# Patient Record
Sex: Female | Born: 1945 | Race: White | Hispanic: No | Marital: Married | Smoking: Former smoker
Health system: Southern US, Community
[De-identification: ages and names within clinical notes are randomized; demographics above are authoritative.]

## PROBLEM LIST (undated history)

## (undated) DIAGNOSIS — I10 Essential (primary) hypertension: Secondary | ICD-10-CM

## (undated) DIAGNOSIS — K589 Irritable bowel syndrome without diarrhea: Secondary | ICD-10-CM

## (undated) DIAGNOSIS — M810 Age-related osteoporosis without current pathological fracture: Secondary | ICD-10-CM

## (undated) DIAGNOSIS — H9190 Unspecified hearing loss, unspecified ear: Secondary | ICD-10-CM

## (undated) DIAGNOSIS — E079 Disorder of thyroid, unspecified: Secondary | ICD-10-CM

## (undated) DIAGNOSIS — C801 Malignant (primary) neoplasm, unspecified: Secondary | ICD-10-CM

## (undated) DIAGNOSIS — F419 Anxiety disorder, unspecified: Secondary | ICD-10-CM

## (undated) DIAGNOSIS — E039 Hypothyroidism, unspecified: Secondary | ICD-10-CM

## (undated) DIAGNOSIS — I341 Nonrheumatic mitral (valve) prolapse: Secondary | ICD-10-CM

## (undated) DIAGNOSIS — E785 Hyperlipidemia, unspecified: Secondary | ICD-10-CM

## (undated) DIAGNOSIS — M47812 Spondylosis without myelopathy or radiculopathy, cervical region: Secondary | ICD-10-CM

## (undated) DIAGNOSIS — G43909 Migraine, unspecified, not intractable, without status migrainosus: Secondary | ICD-10-CM

## (undated) HISTORY — DX: Migraine, unspecified, not intractable, without status migrainosus: G43.909

## (undated) HISTORY — DX: Age-related osteoporosis without current pathological fracture: M81.0

## (undated) HISTORY — PX: FRACTURE SURGERY: SHX138

## (undated) HISTORY — DX: Irritable bowel syndrome, unspecified: K58.9

## (undated) HISTORY — DX: Spondylosis without myelopathy or radiculopathy, cervical region: M47.812

## (undated) HISTORY — DX: Disorder of thyroid, unspecified: E07.9

## (undated) HISTORY — DX: Anxiety disorder, unspecified: F41.9

## (undated) HISTORY — DX: Nonrheumatic mitral (valve) prolapse: I34.1

## (undated) HISTORY — PX: BREAST CYST ASPIRATION: SHX578

## (undated) HISTORY — DX: Hyperlipidemia, unspecified: E78.5

## (undated) HISTORY — PX: ANKLE FRACTURE SURGERY: SHX122

## (undated) HISTORY — DX: Essential (primary) hypertension: I10

## (undated) HISTORY — DX: Malignant (primary) neoplasm, unspecified: C80.1

---

## 2004-11-20 ENCOUNTER — Ambulatory Visit: Payer: Self-pay | Admitting: Podiatry

## 2005-12-10 ENCOUNTER — Ambulatory Visit: Payer: Self-pay | Admitting: Unknown Physician Specialty

## 2010-01-27 ENCOUNTER — Ambulatory Visit: Payer: Self-pay | Admitting: Family Medicine

## 2011-02-04 ENCOUNTER — Ambulatory Visit: Payer: Self-pay | Admitting: Unknown Physician Specialty

## 2011-02-04 LAB — HM COLONOSCOPY

## 2011-05-07 ENCOUNTER — Ambulatory Visit: Payer: Self-pay

## 2011-05-10 LAB — HM MAMMOGRAPHY

## 2013-07-18 ENCOUNTER — Ambulatory Visit: Payer: Self-pay

## 2013-07-18 LAB — HM DEXA SCAN

## 2014-01-18 ENCOUNTER — Ambulatory Visit: Payer: Self-pay | Admitting: Neurology

## 2015-02-23 ENCOUNTER — Emergency Department: Payer: Self-pay | Admitting: Emergency Medicine

## 2015-05-20 DIAGNOSIS — A6920 Lyme disease, unspecified: Secondary | ICD-10-CM | POA: Diagnosis not present

## 2015-06-13 DIAGNOSIS — L570 Actinic keratosis: Secondary | ICD-10-CM | POA: Diagnosis not present

## 2015-06-13 DIAGNOSIS — X32XXXA Exposure to sunlight, initial encounter: Secondary | ICD-10-CM | POA: Diagnosis not present

## 2015-07-07 DIAGNOSIS — Z1231 Encounter for screening mammogram for malignant neoplasm of breast: Secondary | ICD-10-CM | POA: Diagnosis not present

## 2015-08-27 DIAGNOSIS — L720 Epidermal cyst: Secondary | ICD-10-CM | POA: Diagnosis not present

## 2015-08-27 DIAGNOSIS — D0439 Carcinoma in situ of skin of other parts of face: Secondary | ICD-10-CM | POA: Diagnosis not present

## 2015-08-27 DIAGNOSIS — D485 Neoplasm of uncertain behavior of skin: Secondary | ICD-10-CM | POA: Diagnosis not present

## 2015-09-15 DIAGNOSIS — E785 Hyperlipidemia, unspecified: Secondary | ICD-10-CM

## 2015-09-15 DIAGNOSIS — IMO0001 Reserved for inherently not codable concepts without codable children: Secondary | ICD-10-CM

## 2015-09-15 DIAGNOSIS — F419 Anxiety disorder, unspecified: Secondary | ICD-10-CM

## 2015-09-15 DIAGNOSIS — E063 Autoimmune thyroiditis: Secondary | ICD-10-CM | POA: Insufficient documentation

## 2015-09-15 DIAGNOSIS — M810 Age-related osteoporosis without current pathological fracture: Secondary | ICD-10-CM | POA: Insufficient documentation

## 2015-09-15 DIAGNOSIS — I341 Nonrheumatic mitral (valve) prolapse: Secondary | ICD-10-CM | POA: Insufficient documentation

## 2015-09-15 DIAGNOSIS — G43909 Migraine, unspecified, not intractable, without status migrainosus: Secondary | ICD-10-CM | POA: Insufficient documentation

## 2015-09-15 DIAGNOSIS — M47812 Spondylosis without myelopathy or radiculopathy, cervical region: Secondary | ICD-10-CM

## 2015-09-15 DIAGNOSIS — I10 Essential (primary) hypertension: Secondary | ICD-10-CM | POA: Insufficient documentation

## 2015-09-15 DIAGNOSIS — E039 Hypothyroidism, unspecified: Secondary | ICD-10-CM

## 2015-09-16 ENCOUNTER — Encounter: Payer: Self-pay | Admitting: Unknown Physician Specialty

## 2015-09-16 ENCOUNTER — Ambulatory Visit (INDEPENDENT_AMBULATORY_CARE_PROVIDER_SITE_OTHER): Payer: Medicare PPO | Admitting: Unknown Physician Specialty

## 2015-09-16 VITALS — BP 154/82 | HR 60 | Temp 98.4°F | Ht 66.0 in | Wt 149.2 lb

## 2015-09-16 DIAGNOSIS — E039 Hypothyroidism, unspecified: Secondary | ICD-10-CM

## 2015-09-16 DIAGNOSIS — G43909 Migraine, unspecified, not intractable, without status migrainosus: Secondary | ICD-10-CM

## 2015-09-16 DIAGNOSIS — F419 Anxiety disorder, unspecified: Secondary | ICD-10-CM

## 2015-09-16 DIAGNOSIS — I10 Essential (primary) hypertension: Secondary | ICD-10-CM

## 2015-09-16 DIAGNOSIS — Z23 Encounter for immunization: Secondary | ICD-10-CM | POA: Diagnosis not present

## 2015-09-16 DIAGNOSIS — E785 Hyperlipidemia, unspecified: Secondary | ICD-10-CM | POA: Diagnosis not present

## 2015-09-16 DIAGNOSIS — Z0000 Encounter for general adult medical examination without abnormal findings: Secondary | ICD-10-CM | POA: Diagnosis not present

## 2015-09-16 DIAGNOSIS — T753XXA Motion sickness, initial encounter: Secondary | ICD-10-CM

## 2015-09-16 LAB — MICROALBUMIN, URINE WAIVED
Creatinine, Urine Waived: 50 mg/dL (ref 10–300)
MICROALB, UR WAIVED: 10 mg/L (ref 0–19)
Microalb/Creat Ratio: <30 mg/g (ref <30)

## 2015-09-16 MED ORDER — HYDROCHLOROTHIAZIDE 25 MG PO TABS: 25.0000 mg | ORAL_TABLET | Freq: Every day | ORAL | Status: DC

## 2015-09-16 MED ORDER — METOPROLOL SUCCINATE ER 50 MG PO TB24: 45.0000 mg | ORAL_TABLET | Freq: Every day | ORAL | Status: DC

## 2015-09-16 MED ORDER — ALPRAZOLAM 0.25 MG PO TABS: 0.2500 mg | ORAL_TABLET | Freq: Every evening | ORAL | Status: DC | PRN

## 2015-09-16 MED ORDER — ATORVASTATIN CALCIUM 20 MG PO TABS: 20.0000 mg | ORAL_TABLET | Freq: Every day | ORAL | Status: DC

## 2015-09-16 MED ORDER — LEVOTHYROXINE SODIUM 75 MCG PO TABS: 75.0000 ug | ORAL_TABLET | Freq: Every day | ORAL | Status: DC

## 2015-09-16 MED ORDER — SCOPOLAMINE 1 MG/3DAYS TD PT72: 1.0000 | MEDICATED_PATCH | TRANSDERMAL | Status: DC

## 2015-09-16 NOTE — Assessment & Plan Note (Signed)
Second high reading as was up at the gyn.  I would like to add HCTZ

## 2015-09-16 NOTE — Assessment & Plan Note (Signed)
Refill cholesteorl meds.  Check lipid panel

## 2015-09-16 NOTE — Assessment & Plan Note (Signed)
Refill alprazolam 

## 2015-09-16 NOTE — Progress Notes (Signed)
BP 154/82 mmHg  Pulse 60  Temp(Src) 98.4 F (36.9 C)  Ht 5\' 6"  (1.676 m)  Wt 149 lb 3.2 oz (67.677 kg)  BMI 24.09 kg/m2  SpO2 98%  LMP  (LMP Unknown)   Subjective:    Patient ID: Savannah [name], female    DOB: [birth year], 69 y.o.   MRN: [id number]  HPI: Savannah [name] is a 69 y.o. female  Chief Complaint  Patient presents with  . Medicare Wellness  . Medication Refill    pt states she needs refills on the patch, and would like a 90 day supply of other medications. Would also like another rx for alprazolam (listed in historical medications in [facility])   Hyperlipidemia This is a chronic problem. The problem is controlled. She has no history of chronic renal disease, diabetes, hypothyroidism, liver disease, obesity or nephrotic syndrome. There are no known factors aggravating her hyperlipidemia. Pertinent negatives include no chest pain, focal sensory loss, focal weakness, leg pain, myalgias or shortness of breath. Current antihyperlipidemic treatment includes statins. The current treatment provides significant improvement of lipids. There are no compliance problems.    Hypothyroid No weight loss or weight gain.  No cold intolerance.    Anxiety Takes one very occasionally.  I prescription lasts 3 years.    Motion sickness Would like a patch for deep sea fishing.    Depression screen PHQ 2/9 09/16/2015  Decreased Interest 0  Down, Depressed, Hopeless 0  PHQ - 2 Score 0   Functional Status Survey: Is the patient deaf or have difficulty hearing?: Yes Does the patient have difficulty seeing, even when wearing glasses/contacts?: No Does the patient have difficulty concentrating, remembering, or making decisions?: No Does the patient have difficulty walking or climbing stairs?: No Does the patient have difficulty dressing or bathing?: No Does the patient have difficulty doing errands alone such as visiting a doctor's office or shopping?: No   Relevant past medical, surgical,  family and social history reviewed and updated as indicated. Interim medical history since our last visit reviewed. Allergies and medications reviewed and updated.  Review of Systems  Respiratory: Negative for shortness of breath.   Cardiovascular: Negative for chest pain.  Musculoskeletal: Negative for myalgias.  Neurological: Negative for focal weakness.    Per HPI unless specifically indicated above     Objective:    BP 154/82 mmHg  Pulse 60  Temp(Src) 98.4 F (36.9 C)  Ht 5\' 6"  (1.676 m)  Wt 149 lb 3.2 oz (67.677 kg)  BMI 24.09 kg/m2  SpO2 98%  LMP  (LMP Unknown)  Wt Readings from Last 3 Encounters:  09/16/15 149 lb 3.2 oz (67.677 kg)  05/20/15 147 lb (66.679 kg)    Physical Exam  Constitutional: She is oriented to person, place, and time. She appears well-developed and well-nourished. No distress.  HENT:  Head: Normocephalic and atraumatic.  Eyes: Conjunctivae and lids are normal. Right eye exhibits no discharge. Left eye exhibits no discharge. No scleral icterus.  Cardiovascular: Normal rate and regular rhythm.   Pulmonary/Chest: Effort normal. No respiratory distress.  Abdominal: Normal appearance and bowel sounds are normal. She exhibits no distension. There is no splenomegaly or hepatomegaly. There is no tenderness.  Musculoskeletal: Normal range of motion.  Neurological: She is alert and oriented to person, place, and time.  Skin: Skin is intact. No rash noted. No pallor.  Psychiatric: She has a normal mood and affect. Her behavior is normal. Judgment and thought content normal.  Assessment & Plan:   Problem List Items Addressed This Visit      Unprioritized   Hypothyroidism   Relevant Medications   levothyroxine (SYNTHROID, LEVOTHROID) 75 MCG tablet   metoprolol succinate (TOPROL-XL) 50 MG 24 hr tablet   Other Relevant Orders   TSH   Acute anxiety    Refill alprazolam      Relevant Medications   ALPRAZolam (XANAX) 0.25 MG tablet   Hyperlipidemia     Refill cholesteorl meds.  Check lipid panel      Relevant Medications   atorvastatin (LIPITOR) 20 MG tablet   metoprolol succinate (TOPROL-XL) 50 MG 24 hr tablet   hydrochlorothiazide (HYDRODIURIL) 25 MG tablet   Other Relevant Orders   Lipid Panel w/o Chol/HDL Ratio   Hypertension - Primary   Relevant Medications   atorvastatin (LIPITOR) 20 MG tablet   metoprolol succinate (TOPROL-XL) 50 MG 24 hr tablet   hydrochlorothiazide (HYDRODIURIL) 25 MG tablet   Other Relevant Orders   Comprehensive metabolic panel   Migraines    Second high reading as was up at the gyn.  I would like to add HCTZ      Relevant Medications   atorvastatin (LIPITOR) 20 MG tablet   metoprolol succinate (TOPROL-XL) 50 MG 24 hr tablet   hydrochlorothiazide (HYDRODIURIL) 25 MG tablet    Other Visit Diagnoses    Routine general medical examination at a health care facility        Relevant Orders    Hepatitis C antibody    Pneumococcal conjugate vaccine 13-valent IM    Motion sickness, initial encounter        Scopolamine refilled        Follow up plan: No Follow-up on file.

## 2015-09-17 ENCOUNTER — Encounter: Payer: Self-pay | Admitting: Unknown Physician Specialty

## 2015-09-17 LAB — COMPREHENSIVE METABOLIC PANEL
A/G RATIO: 2 (ref 1.1–2.5)
ALK PHOS: 60 IU/L (ref 39–117)
ALT: 10 IU/L (ref 0–32)
AST: 15 IU/L (ref 0–40)
Albumin: 4.3 g/dL (ref 3.5–4.8)
BILIRUBIN TOTAL: 0.6 mg/dL (ref 0.0–1.2)
BUN/Creatinine Ratio: 19 (ref 11–26)
BUN: 14 mg/dL (ref 8–27)
CHLORIDE: 99 mmol/L (ref 97–108)
CO2: 26 mmol/L (ref 18–29)
Calcium: 9.8 mg/dL (ref 8.7–10.3)
Creatinine, Ser: 0.75 mg/dL (ref 0.57–1.00)
GFR calc Af Amer: 93 mL/min/{1.73_m2} (ref >59)
GFR, EST NON AFRICAN AMERICAN: 81 mL/min/{1.73_m2} (ref >59)
GLOBULIN, TOTAL: 2.2 g/dL (ref 1.5–4.5)
Glucose: 85 mg/dL (ref 65–99)
POTASSIUM: 4.1 mmol/L (ref 3.5–5.2)
SODIUM: 144 mmol/L (ref 134–144)
Total Protein: 6.5 g/dL (ref 6.0–8.5)

## 2015-09-17 LAB — LIPID PANEL W/O CHOL/HDL RATIO
CHOLESTEROL TOTAL: 181 mg/dL (ref 100–199)
HDL: 89 mg/dL (ref >39)
LDL CALC: 73 mg/dL (ref 0–99)
TRIGLYCERIDES: 93 mg/dL (ref 0–149)
VLDL CHOLESTEROL CAL: 19 mg/dL (ref 5–40)

## 2015-09-17 LAB — HEPATITIS C ANTIBODY: Hep C Virus Ab: <0.1 s/co ratio (ref 0.0–0.9)

## 2015-09-17 LAB — TSH: TSH: 1.04 u[IU]/mL (ref 0.450–4.500)

## 2015-09-17 LAB — URIC ACID: Uric Acid: 5 mg/dL (ref 2.5–7.1)

## 2015-09-20 HISTORY — PX: OTHER SURGICAL HISTORY: SHX169

## 2015-10-09 DIAGNOSIS — D0439 Carcinoma in situ of skin of other parts of face: Secondary | ICD-10-CM | POA: Diagnosis not present

## 2015-10-09 DIAGNOSIS — L908 Other atrophic disorders of skin: Secondary | ICD-10-CM | POA: Diagnosis not present

## 2015-10-09 DIAGNOSIS — L814 Other melanin hyperpigmentation: Secondary | ICD-10-CM | POA: Diagnosis not present

## 2015-10-09 DIAGNOSIS — L578 Other skin changes due to chronic exposure to nonionizing radiation: Secondary | ICD-10-CM | POA: Diagnosis not present

## 2015-10-09 HISTORY — PX: SQUAMOUS CELL CARCINOMA EXCISION: SHX2433

## 2015-10-17 ENCOUNTER — Ambulatory Visit: Payer: Medicare PPO | Admitting: Unknown Physician Specialty

## 2015-11-24 ENCOUNTER — Encounter: Payer: Self-pay | Admitting: Unknown Physician Specialty

## 2015-11-24 ENCOUNTER — Ambulatory Visit (INDEPENDENT_AMBULATORY_CARE_PROVIDER_SITE_OTHER): Payer: Medicare PPO | Admitting: Unknown Physician Specialty

## 2015-11-24 VITALS — BP 124/74 | HR 81 | Temp 98.8°F | Ht 65.7 in | Wt 145.2 lb

## 2015-11-24 DIAGNOSIS — R030 Elevated blood-pressure reading, without diagnosis of hypertension: Secondary | ICD-10-CM

## 2015-11-24 DIAGNOSIS — IMO0001 Reserved for inherently not codable concepts without codable children: Secondary | ICD-10-CM

## 2015-11-24 NOTE — Progress Notes (Signed)
   BP 124/74 mmHg  Pulse 81  Temp(Src) 98.8 F (37.1 C)  Ht 5' 5.7" (1.669 m)  Wt 145 lb 3.2 oz (65.862 kg)  BMI 23.64 kg/m2  SpO2 97%  LMP  (LMP Unknown)   Subjective:    Patient ID: Savannah [name], female    DOB: [birth year], 69 y.o.   MRN: [id number]  HPI: Savannah [name] is a 69 y.o. female  Chief Complaint  Patient presents with  . Hypertension    pt states she is here for her blood pressure. States it is different between her two arms and sometimes her BP stays down. Wants to understand why her BP is different.   Hypertension Pt is taking HCTZ ut stopped taking it after checking her home BPs and were good.  At [facility] surgery it was in the 130's.  She restarted her HCTZ.   Average home BPs 120's/70's except on BP meds and SBP frequently below 120   No problems or lightheadedness No chest pain with exertion or shortness of breath No Edema  Hyperlipidemia Using medications without problems: No Muscle aches  Diet compliance: Exercise:  Relevant past medical, surgical, family and social history reviewed and updated as indicated. Interim medical history since our last visit reviewed. Allergies and medications reviewed and updated.  Review of Systems  Per HPI unless specifically indicated above     Objective:    BP 124/74 mmHg  Pulse 81  Temp(Src) 98.8 F (37.1 C)  Ht 5' 5.7" (1.669 m)  Wt 145 lb 3.2 oz (65.862 kg)  BMI 23.64 kg/m2  SpO2 97%  LMP  (LMP Unknown)  Wt Readings from Last 3 Encounters:  11/24/15 145 lb 3.2 oz (65.862 kg)  09/16/15 149 lb 3.2 oz (67.677 kg)  05/20/15 147 lb (66.679 kg)    Physical Exam  Constitutional: She is oriented to person, place, and time. She appears well-developed and well-nourished. No distress.  HENT:  Head: Normocephalic and atraumatic.  Eyes: Conjunctivae and lids are normal. Right eye exhibits no discharge. Left eye exhibits no discharge. No scleral icterus.  Neck: Normal range of motion. Neck supple. No JVD present. Carotid  bruit is not present.  Cardiovascular: Normal rate, regular rhythm and normal heart sounds.   Pulmonary/Chest: Effort normal and breath sounds normal.  Abdominal: Normal appearance. There is no splenomegaly or hepatomegaly.  Musculoskeletal: Normal range of motion.  Neurological: She is alert and oriented to person, place, and time.  Skin: Skin is warm, dry and intact. No rash noted. No pallor.  Psychiatric: She has a normal mood and affect. Her behavior is normal. Judgment and thought content normal.      Assessment & Plan:   Problem List Items Addressed This Visit      Unprioritized   White coat hypertension - Primary    BP is good at home and during Moh's surgery.  Will DC HCTZ          Follow up plan: Return in about 4 months (around 03/24/2016).

## 2015-11-24 NOTE — Assessment & Plan Note (Addendum)
BP is good at home and during Moh's surgery.  Will DC HCTZ

## 2016-02-01 ENCOUNTER — Encounter: Payer: Self-pay | Admitting: Emergency Medicine

## 2016-02-01 ENCOUNTER — Emergency Department: Payer: Medicare Other

## 2016-02-01 DIAGNOSIS — Z87891 Personal history of nicotine dependence: Secondary | ICD-10-CM | POA: Diagnosis not present

## 2016-02-01 DIAGNOSIS — F419 Anxiety disorder, unspecified: Secondary | ICD-10-CM | POA: Diagnosis not present

## 2016-02-01 DIAGNOSIS — Z7982 Long term (current) use of aspirin: Secondary | ICD-10-CM | POA: Diagnosis not present

## 2016-02-01 DIAGNOSIS — R002 Palpitations: Secondary | ICD-10-CM | POA: Diagnosis present

## 2016-02-01 DIAGNOSIS — Z79899 Other long term (current) drug therapy: Secondary | ICD-10-CM | POA: Insufficient documentation

## 2016-02-01 DIAGNOSIS — I10 Essential (primary) hypertension: Secondary | ICD-10-CM | POA: Insufficient documentation

## 2016-02-01 DIAGNOSIS — Z880 Allergy status to penicillin: Secondary | ICD-10-CM | POA: Insufficient documentation

## 2016-02-01 LAB — BASIC METABOLIC PANEL
Anion gap: 7 (ref 5–15)
BUN: 14 mg/dL (ref 6–20)
CALCIUM: 9.3 mg/dL (ref 8.9–10.3)
CO2: 30 mmol/L (ref 22–32)
CREATININE: 0.88 mg/dL (ref 0.44–1.00)
Chloride: 106 mmol/L (ref 101–111)
GFR calc Af Amer: >60 mL/min (ref >60)
GFR calc non Af Amer: >60 mL/min (ref >60)
Glucose, Bld: 154 mg/dL — ABNORMAL HIGH (ref 65–99)
Potassium: 3.7 mmol/L (ref 3.5–5.1)
Sodium: 143 mmol/L (ref 135–145)

## 2016-02-01 LAB — CBC
HCT: 41.7 % (ref 35.0–47.0)
Hemoglobin: 13.8 g/dL (ref 12.0–16.0)
MCH: 31.2 pg (ref 26.0–34.0)
MCHC: 33.1 g/dL (ref 32.0–36.0)
MCV: 94.1 fL (ref 80.0–100.0)
PLATELETS: 165 10*3/uL (ref 150–440)
RBC: 4.43 MIL/uL (ref 3.80–5.20)
RDW: 13.1 % (ref 11.5–14.5)
WBC: 5.2 10*3/uL (ref 3.6–11.0)

## 2016-02-01 LAB — TROPONIN I: TROPONIN I: 0.03 ng/mL (ref <0.031)

## 2016-02-01 NOTE — ED Notes (Signed)
Patient states that she feels like her heart is skipping a beat. She reports that it has been happening for about 10 days intermittently. Patient states that it does seem to improve with xanax.

## 2016-02-02 ENCOUNTER — Emergency Department
Admission: EM | Admit: 2016-02-02 | Discharge: 2016-02-02 | Disposition: A | Discharge status: Home / Self Care | Payer: Medicare Other | Attending: Emergency Medicine | Admitting: Emergency Medicine

## 2016-02-02 DIAGNOSIS — R002 Palpitations: Secondary | ICD-10-CM

## 2016-02-02 LAB — MAGNESIUM: Magnesium: 1.9 mg/dL (ref 1.7–2.4)

## 2016-02-02 LAB — TSH: TSH: 2.948 u[IU]/mL (ref 0.350–4.500)

## 2016-02-02 NOTE — ED Provider Notes (Signed)
[facility] Emergency Department Provider Note  ____________________________________________  Time seen: Approximately 301 AM  I have reviewed the triage vital signs and the nursing notes.   HISTORY  Chief Complaint Irregular Heart Beat    HPI Savannah [name] is a 70 y.o. female who comes into the hospital today with palpitations. The patient reports that her heart has been acting up and she is been dealing with it for the past 10 days. The patient reports that she is unsure if she is anxious or that something else. The symptoms don't stay all of the time. She reports that she feels a sensation at the bottom of her sternum that feels like a loop or a sinking feeling. She reports that then she feels her pulse there. The patient feels as though she has to catch her breath that she can't catch her breath and it seems to stay for hours. The sensation makes her anxious and scared. She did not have any episodes yesterday but did have multiple episodes today. The patient has been taking Xanax which she taken for in the past 10 days which is the thing that helps with her symptoms. She denies any pain to his discomfort. The patient is unsure brings it on but reports that when she eats sometimes it happens. The patient has not called her doctor to have this evaluated but has done Holter monitoring in the past. The patient reports that her blood pressure today was 161/85 so she became very concerned so she decided to come in to get checked out.The symptoms have resolved at this time.   Past Medical History  Diagnosis Date  . Thyroid disease   . MVP (mitral valve prolapse)   . Anxiety   . Osteoporosis   . OA (osteoarthritis) of neck   . Hyperlipidemia   . Hypertension   . IBS (irritable bowel syndrome)   . Cancer ([place])     skin- squamous    Patient Active Problem List   Diagnosis Date Noted  . Hypothyroidism 09/15/2015  . MVP (mitral valve prolapse) 09/15/2015  . Acute  anxiety 09/15/2015  . Osteoporosis 09/15/2015  . OA (osteoarthritis) of neck 09/15/2015  . Hyperlipidemia 09/15/2015  . White coat hypertension 09/15/2015  . Migraines 09/15/2015    Past Surgical History  Procedure Laterality Date  . 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 Ankle fracture surgery Left     Current Outpatient Rx  Name  Route  Sig  Dispense  Refill  . ALPRAZolam (XANAX) 0.25 MG tablet   Oral   Take 1 tablet (0.25 mg total) by mouth at bedtime as needed for anxiety.   20 tablet   0   . aspirin 81 MG tablet   Oral   Take 81 mg by mouth daily.         Marland Kitchen atorvastatin (LIPITOR) 20 MG tablet   Oral   Take 1 tablet (20 mg total) by mouth daily.   90 tablet   1   . hydrochlorothiazide (HYDRODIURIL) 25 MG tablet   Oral   Take 1 tablet (25 mg total) by mouth daily.   90 tablet   3   . levothyroxine (SYNTHROID, LEVOTHROID) 75 MCG tablet   Oral   Take 1 tablet (75 mcg total) by mouth daily before breakfast.   90 tablet   1   . metoprolol succinate (TOPROL-XL) 50 MG 24 hr tablet   Oral   Take 1 tablet (50  mg total) by mouth daily. Take 1/2 tablet daily   45 tablet   1   . scopolamine (TRANSDERM-SCOP) 1 MG/3DAYS   Transdermal   Place 1 patch (1.5 mg total) onto the skin every 3 (three) days.   10 patch   3     Allergies Amoxil and Penicillins  Family History  Problem Relation Age of Onset  . Cancer Mother     colon  . Hypertension Mother   . Cancer Father     lung  . Hyperlipidemia Brother     Social History Social History  Substance Use Topics  . Smoking status: Former Research scientist (life sciences)  . Smokeless tobacco: Never Used  . Alcohol Use: 8.4 oz/week    14 Glasses of wine, 0 Standard drinks or equivalent per week    Review of Systems Constitutional: No fever/chills Eyes: No visual changes. ENT: No sore throat. Cardiovascular: Palpitations Respiratory: shortness of breath. Gastrointestinal: No abdominal pain.  No nausea, no  vomiting.  No diarrhea.  No constipation. Genitourinary: Negative for dysuria. Musculoskeletal: Negative for back pain. Skin: Negative for rash. Neurological: Negative for headaches, focal weakness or numbness.  10-point ROS otherwise negative.  ____________________________________________   PHYSICAL EXAM:  VITAL SIGNS: ED Triage Vitals  Enc Vitals Group     BP 02/01/16 2222 161/85 mmHg     Pulse Rate 02/01/16 2222 79     Resp 02/01/16 2222 18     Temp 02/01/16 2222 98.4 F (36.9 C)     Temp Source 02/01/16 2222 Oral     SpO2 02/01/16 2222 98 %     Weight 02/01/16 2222 145 lb (65.772 kg)     Height 02/01/16 2222 5\' 6"  (1.676 m)     Head Cir --      Peak Flow --      Pain Score --      Pain Loc --      Pain Edu? --      Excl. in [place]? --     Constitutional: Alert and oriented. Well appearing and in no acute distress. Eyes: Conjunctivae are normal. PERRL. EOMI. Head: Atraumatic. Nose: No congestion/rhinnorhea. Mouth/Throat: Mucous membranes are moist.  Oropharynx non-erythematous. Cardiovascular: Normal rate, regular rhythm. Grossly normal heart sounds.  Good peripheral circulation. Respiratory: Normal respiratory effort.  No retractions. Lungs CTAB. Gastrointestinal: Soft and nontender. No distention. Positive bowel sounds Musculoskeletal: No lower extremity tenderness nor edema.  Neurologic:  Normal speech and language.  Skin:  Skin is warm, dry and intact.  Psychiatric: Mood and affect are normal.   ____________________________________________   LABS (all labs ordered are listed, but only abnormal results are displayed)  Labs Reviewed  BASIC METABOLIC PANEL - Abnormal; Notable for the following:    Glucose, Bld 154 (*)    All other components within normal limits  CBC  TROPONIN I  MAGNESIUM  TSH   ____________________________________________  EKG  ED ECG REPORT I, Loney Hering, the attending physician, personally viewed and interpreted this  ECG.   Date: 02/01/2016  EKG Time: 2229  Rate: 75  Rhythm: normal sinus rhythm  Axis: normal  Intervals:none  ST&T Change: none  ____________________________________________  RADIOLOGY  Chest x-ray: No active cardiopulmonary disease ____________________________________________   PROCEDURES  Procedure(s) performed: None  Critical Care performed: No  ____________________________________________   INITIAL IMPRESSION / ASSESSMENT AND PLAN / ED COURSE  Pertinent labs & imaging results that were available during my care of the patient were reviewed by me and considered in  my medical decision making (see chart for details).  This is a 70 year old female who comes into the hospital today with 10 days of palpitations. The patient's symptoms are resolved but she is very anxious about what may be causing her symptoms. We did have a conversation about her caffeine intake which she reports that she just 1-1-1/2 cups a day. The patient otherwise is unsure what going on. The patient's blood work is unremarkable and she does not have any of these symptoms at this time. I did inform the patient about cutting back on her caffeine and following up with her doctor to have Holter monitoring to determine the symptoms of her palpitations. The patient will be discharged home to follow-up with her doctor. The patient has no further questions at this time and understands the plan as stated. ____________________________________________   FINAL CLINICAL IMPRESSION(S) / ED DIAGNOSES  Final diagnoses:  Palpitations      Loney Hering, MD 02/02/16 [phone]

## 2016-02-02 NOTE — Discharge Instructions (Signed)

## 2016-02-02 NOTE — ED Notes (Signed)
Pt states for 10 days has had epigastric "discomfort" associated with palpitaitons and "feeling like i can't catch my breath". Pt denies cough, diaphoresis, dizziness, syncope, near syncope, jaw pain, neck pain, arm pain. Pt states does have pain radiation to right mid back. Pt states discomfort and irregular heart rate sensation is worse after eating. Pt describes discomfort as "a sinking feeling". Skin pwd. resps unlabored. Pt denies nausea, vomiting.

## 2016-02-02 NOTE — ED Notes (Signed)
Pt updated on add on labs of magnesium and tsh. Pt verbalizes  Understanding.

## 2016-02-12 ENCOUNTER — Other Ambulatory Visit: Payer: Self-pay | Admitting: Nurse Practitioner

## 2016-02-12 DIAGNOSIS — R1013 Epigastric pain: Secondary | ICD-10-CM

## 2016-02-25 ENCOUNTER — Ambulatory Visit: Payer: Medicare Other

## 2016-03-01 ENCOUNTER — Ambulatory Visit: Payer: Medicare Other

## 2016-03-03 ENCOUNTER — Ambulatory Visit: Payer: Medicare PPO | Admitting: Unknown Physician Specialty

## 2016-03-03 ENCOUNTER — Ambulatory Visit
Admission: RE | Admit: 2016-03-03 | Discharge: 2016-03-03 | Disposition: A | Discharge status: Home / Self Care | Payer: Medicare Other | Source: Ambulatory Visit | Attending: Nurse Practitioner | Admitting: Nurse Practitioner

## 2016-03-03 DIAGNOSIS — D1771 Benign lipomatous neoplasm of kidney: Secondary | ICD-10-CM | POA: Insufficient documentation

## 2016-03-03 DIAGNOSIS — R1013 Epigastric pain: Secondary | ICD-10-CM | POA: Insufficient documentation

## 2016-04-07 ENCOUNTER — Other Ambulatory Visit: Payer: Self-pay | Admitting: Unknown Physician Specialty

## 2016-04-07 NOTE — Telephone Encounter (Signed)
Needs check

## 2016-04-07 NOTE — Telephone Encounter (Signed)
Patient has appointment scheduled for 04/13/16.

## 2016-04-09 ENCOUNTER — Encounter: Payer: Self-pay | Admitting: *Deleted

## 2016-04-12 ENCOUNTER — Ambulatory Visit
Admission: RE | Admit: 2016-04-12 | Discharge: 2016-04-12 | Disposition: A | Discharge status: Home / Self Care | Payer: Medicare Other | Source: Ambulatory Visit | Attending: Unknown Physician Specialty | Admitting: Unknown Physician Specialty

## 2016-04-12 ENCOUNTER — Encounter
Admission: RE | Disposition: A | Discharge status: Home / Self Care | Payer: Self-pay | Source: Ambulatory Visit | Attending: Unknown Physician Specialty

## 2016-04-12 ENCOUNTER — Ambulatory Visit: Payer: Medicare Other | Admitting: Anesthesiology

## 2016-04-12 ENCOUNTER — Encounter: Payer: Self-pay | Admitting: *Deleted

## 2016-04-12 DIAGNOSIS — Z1211 Encounter for screening for malignant neoplasm of colon: Secondary | ICD-10-CM | POA: Diagnosis present

## 2016-04-12 DIAGNOSIS — E079 Disorder of thyroid, unspecified: Secondary | ICD-10-CM | POA: Insufficient documentation

## 2016-04-12 DIAGNOSIS — Z79899 Other long term (current) drug therapy: Secondary | ICD-10-CM | POA: Diagnosis not present

## 2016-04-12 DIAGNOSIS — I10 Essential (primary) hypertension: Secondary | ICD-10-CM | POA: Insufficient documentation

## 2016-04-12 DIAGNOSIS — Z7982 Long term (current) use of aspirin: Secondary | ICD-10-CM | POA: Diagnosis not present

## 2016-04-12 DIAGNOSIS — M479 Spondylosis, unspecified: Secondary | ICD-10-CM | POA: Insufficient documentation

## 2016-04-12 DIAGNOSIS — Z8249 Family history of ischemic heart disease and other diseases of the circulatory system: Secondary | ICD-10-CM | POA: Diagnosis not present

## 2016-04-12 DIAGNOSIS — Z85828 Personal history of other malignant neoplasm of skin: Secondary | ICD-10-CM | POA: Diagnosis not present

## 2016-04-12 DIAGNOSIS — Z8349 Family history of other endocrine, nutritional and metabolic diseases: Secondary | ICD-10-CM | POA: Diagnosis not present

## 2016-04-12 DIAGNOSIS — K621 Rectal polyp: Secondary | ICD-10-CM | POA: Diagnosis not present

## 2016-04-12 DIAGNOSIS — M810 Age-related osteoporosis without current pathological fracture: Secondary | ICD-10-CM | POA: Diagnosis not present

## 2016-04-12 DIAGNOSIS — Z881 Allergy status to other antibiotic agents status: Secondary | ICD-10-CM | POA: Insufficient documentation

## 2016-04-12 DIAGNOSIS — E785 Hyperlipidemia, unspecified: Secondary | ICD-10-CM | POA: Diagnosis not present

## 2016-04-12 DIAGNOSIS — Z801 Family history of malignant neoplasm of trachea, bronchus and lung: Secondary | ICD-10-CM | POA: Insufficient documentation

## 2016-04-12 DIAGNOSIS — Z880 Allergy status to penicillin: Secondary | ICD-10-CM | POA: Diagnosis not present

## 2016-04-12 DIAGNOSIS — K640 First degree hemorrhoids: Secondary | ICD-10-CM | POA: Diagnosis not present

## 2016-04-12 DIAGNOSIS — F419 Anxiety disorder, unspecified: Secondary | ICD-10-CM | POA: Insufficient documentation

## 2016-04-12 DIAGNOSIS — K589 Irritable bowel syndrome without diarrhea: Secondary | ICD-10-CM | POA: Insufficient documentation

## 2016-04-12 DIAGNOSIS — Z800 Family history of malignant neoplasm of digestive organs: Secondary | ICD-10-CM | POA: Diagnosis present

## 2016-04-12 DIAGNOSIS — Z87891 Personal history of nicotine dependence: Secondary | ICD-10-CM | POA: Diagnosis not present

## 2016-04-12 DIAGNOSIS — I341 Nonrheumatic mitral (valve) prolapse: Secondary | ICD-10-CM | POA: Insufficient documentation

## 2016-04-12 HISTORY — PX: COLONOSCOPY WITH PROPOFOL: SHX5780

## 2016-04-12 SURGERY — COLONOSCOPY WITH PROPOFOL
Anesthesia: General

## 2016-04-12 MED ORDER — FENTANYL CITRATE (PF) 100 MCG/2ML IJ SOLN
INTRAMUSCULAR | Status: DC | PRN
  Administered 2016-04-12: 50 ug via INTRAVENOUS

## 2016-04-12 MED ORDER — PROPOFOL 500 MG/50ML IV EMUL
INTRAVENOUS | Status: DC | PRN
  Administered 2016-04-12: 120 ug/kg/min via INTRAVENOUS

## 2016-04-12 MED ORDER — MIDAZOLAM HCL 2 MG/2ML IJ SOLN
INTRAMUSCULAR | Status: DC | PRN
  Administered 2016-04-12: 1 mg via INTRAVENOUS

## 2016-04-12 MED ORDER — SODIUM CHLORIDE 0.9 % IV SOLN
INTRAVENOUS | Status: DC
  Administered 2016-04-12 (×2): via INTRAVENOUS
  Administered 2016-04-12: 1000 mL via INTRAVENOUS

## 2016-04-12 MED ORDER — SODIUM CHLORIDE 0.9 % IV SOLN: INTRAVENOUS | Status: DC

## 2016-04-12 NOTE — Transfer of Care (Signed)
Immediate Anesthesia Transfer of Care Note  Patient: Savannah [name]  Procedure(s) Performed: Procedure(s): COLONOSCOPY WITH PROPOFOL (N/A)  Patient Location: PACU  Anesthesia Type:General  Level of Consciousness: awake, alert  and oriented  Airway & Oxygen Therapy: Patient Spontanous Breathing and Patient connected to nasal cannula oxygen  Post-op Assessment: Report given to RN and Post -op Vital signs reviewed and stable  Post vital signs: Reviewed and stable  Last Vitals:  Filed Vitals:   04/12/16 1224  BP: 155/75  Pulse: 76  Temp: 36.6 C  Resp: 18    Complications: No apparent anesthesia complications

## 2016-04-12 NOTE — Anesthesia Preprocedure Evaluation (Signed)
Anesthesia Evaluation  Patient identified by MRN, date of birth, ID band Patient awake    Reviewed: Allergy & Precautions, NPO status , Patient's Chart, lab work & pertinent test results, reviewed documented beta blocker date and time   Airway Mallampati: II  TM Distance: >3 FB     Dental  (+) Chipped   Pulmonary former smoker,           Cardiovascular hypertension, Pt. on medications and Pt. on home beta blockers      Neuro/Psych  Headaches, Anxiety    GI/Hepatic   Endo/Other  Hypothyroidism   Renal/GU      Musculoskeletal  (+) Arthritis ,   Abdominal   Peds  Hematology   Anesthesia Other Findings   Reproductive/Obstetrics                             Anesthesia Physical Anesthesia Plan  ASA: III  Anesthesia Plan: General   Post-op Pain Management:    Induction:   Airway Management Planned: Nasal Cannula  Additional Equipment:   Intra-op Plan:   Post-operative Plan:   Informed Consent: I have reviewed the patients History and Physical, chart, labs and discussed the procedure including the risks, benefits and alternatives for the proposed anesthesia with the patient or authorized representative who has indicated his/her understanding and acceptance.     Plan Discussed with: CRNA  Anesthesia Plan Comments:         Anesthesia Quick Evaluation

## 2016-04-12 NOTE — H&P (Signed)
Primary Care Physician:  Kathrine Haddock, NP Primary Gastroenterologist:  Dr. Vira Agar  Pre-Procedure History & Physical: HPI:  Savannah [name] is a 70 y.o. female is here for an colonoscopy.   Past Medical History  Diagnosis Date  . Thyroid disease   . MVP (mitral valve prolapse)   . Anxiety   . Osteoporosis   . OA (osteoarthritis) of neck   . Hyperlipidemia   . Hypertension   . IBS (irritable bowel syndrome)   . 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 Ankle fracture surgery Left   . Fracture surgery      Prior to Admission medications   Medication Sig Start Date End Date Taking? Authorizing Provider  ALPRAZolam (XANAX) 0.25 MG tablet Take 1 tablet (0.25 mg total) by mouth at bedtime as needed for anxiety. 09/16/15  Yes Kathrine Haddock, NP  aspirin 81 MG tablet Take 81 mg by mouth daily.   Yes Historical Provider, MD  atorvastatin (LIPITOR) 20 MG tablet Take 1 tablet (20 mg total) by mouth daily. 04/07/16  Yes Kathrine Haddock, NP  metoprolol succinate (TOPROL-XL) 50 MG 24 hr tablet Take 1 tablet (50 mg total) by mouth daily. Take 1/2 tablet daily 09/16/15  Yes Kathrine Haddock, NP  scopolamine (TRANSDERM-SCOP) 1 MG/3DAYS Place 1 patch (1.5 mg total) onto the skin every 3 (three) days. 09/16/15  Yes Kathrine Haddock, NP  SYNTHROID 75 MCG tablet Take 1 tablet (75 mcg total) by mouth daily before breakfast. 04/07/16  Yes Kathrine Haddock, NP  hydrochlorothiazide (HYDRODIURIL) 25 MG tablet Take 1 tablet (25 mg total) by mouth daily. Patient not taking: Reported on 04/12/2016 09/16/15   Kathrine Haddock, NP    Allergies as of 04/06/2016 - Review Complete 02/01/2016  Allergen Reaction Noted  . Amoxil [amoxicillin]  09/15/2015  . Penicillins  02/01/2016    Family History  Problem Relation Age of Onset  . Cancer Mother     colon  . Hypertension Mother   . Cancer Father     lung  . Hyperlipidemia  Brother     Social History   Social History  . Marital Status: Married    Spouse Name: N/A  . Number of Children: N/A  . Years of Education: N/A   Occupational History  . Not on file.   Social History Main Topics  . Smoking status: Former Research scientist (life sciences)  . Smokeless tobacco: Never Used  . Alcohol Use: 8.4 oz/week    14 Glasses of wine, 0 Standard drinks or equivalent per week  . Drug Use: No  . Sexual Activity: Yes   Other Topics Concern  . Not on file   Social History Narrative    Review of Systems: See HPI, otherwise negative ROS  Physical Exam: BP 155/75 mmHg  Pulse 76  Temp(Src) 97.8 F (36.6 C) (Tympanic)  Resp 18  Ht 5\' 6"  (1.676 m)  Wt 65.772 kg (145 lb)  BMI 23.41 kg/m2  SpO2 100%  LMP  (LMP Unknown) General:   Alert,  pleasant and cooperative in NAD Head:  Normocephalic and atraumatic. Neck:  Supple; no masses or thyromegaly. Lungs:  Clear throughout to auscultation.    Heart:  Regular rate and rhythm. Abdomen:  Soft, nontender and nondistended. Normal bowel sounds, without guarding, and without rebound.   Neurologic:  Alert and  oriented x4;  grossly normal neurologically.  Impression/Plan: [name] is here for an colonoscopy to be performed for FH colon cancer  Risks, benefits, limitations, and alternatives regarding  colonoscopy have been reviewed with the patient.  Questions have been answered.  All parties agreeable.   Gaylyn Cheers, MD  04/12/2016, 1:43 PM

## 2016-04-12 NOTE — Op Note (Signed)
[facility] Patient Name: Savannah [name] Procedure Date: 04/12/2016 1:38 PM MRN: [id number] Account #: [id number] Date of Birth: [birth year] Admit Type: Outpatient Age: 70 Room: [facility] ENDO ROOM 1 Gender: Female Note Status: Finalized Procedure:            Colonoscopy Indications:          Screening in patient at increased risk: Family history                        of 1st-degree relative with colorectal cancer Providers:            Manya Silvas, MD Referring MD:         Kathrine Haddock, PA (Referring MD) Medicines:            Propofol per Anesthesia Complications:        No immediate complications. Procedure:            Pre-Anesthesia Assessment:                       - After reviewing the risks and benefits, the patient                        was deemed in satisfactory condition to undergo the                        procedure.                       After obtaining informed consent, the colonoscope was                        passed under direct vision. Throughout the procedure,                        the patient's blood pressure, pulse, and oxygen                        saturations were monitored continuously. The                        Colonoscope was introduced through the anus and                        advanced to the the cecum, identified by appendiceal                        orifice and ileocecal valve. The colonoscopy was                        performed without difficulty. The patient tolerated the                        procedure well. The quality of the bowel preparation                        was good. Findings:      Two sessile polyps were found in the rectum. The polyps were very       diminutive in size. These polyps were removed with a jumbo cold forceps.       Resection and retrieval were complete.  Internal hemorrhoids were found during endoscopy. The hemorrhoids were       small and Grade I (internal hemorrhoids that do not  prolapse).      The exam was otherwise without abnormality. Impression:           - Two diminutive polyps in the rectum, removed with a                        jumbo cold forceps. Resected and retrieved.                       - Internal hemorrhoids.                       - The examination was otherwise normal. Recommendation:       - Await pathology results. Manya Silvas, MD 04/12/2016 2:15:00 PM This report has been signed electronically. Number of Addenda: 0 Note Initiated On: 04/12/2016 1:38 PM Scope Withdrawal Time: 0 hours 19 minutes 29 seconds  Total Procedure Duration: 0 hours 24 minutes 1 second       [facility]

## 2016-04-12 NOTE — Anesthesia Procedure Notes (Signed)
Performed by: Vaughan Sine Pre-anesthesia Checklist: Patient identified, Emergency Drugs available, Suction available, Timeout performed and Patient being monitored Oxygen Delivery Method: Nasal cannula Preoxygenation: Pre-oxygenation with 100% oxygen Intubation Type: IV induction Placement Confirmation: positive ETCO2 and CO2 detector

## 2016-04-12 NOTE — Anesthesia Postprocedure Evaluation (Signed)
Anesthesia Post Note  Patient: Savannah [name]  Procedure(s) Performed: Procedure(s) (LRB): COLONOSCOPY WITH PROPOFOL (N/A)  Patient location during evaluation: Endoscopy Anesthesia Type: General Level of consciousness: awake and alert Pain management: pain level controlled Vital Signs Assessment: post-procedure vital signs reviewed and stable Respiratory status: spontaneous breathing, nonlabored ventilation, respiratory function stable and patient connected to nasal cannula oxygen Cardiovascular status: blood pressure returned to baseline and stable Postop Assessment: no signs of nausea or vomiting Anesthetic complications: no    Last Vitals:  Filed Vitals:   04/12/16 1437 04/12/16 1447  BP: 111/62 118/68  Pulse: 76 79  Temp:    Resp: 18 16    Last Pain: There were no vitals filed for this visit.               Corrina Steffensen S

## 2016-04-13 ENCOUNTER — Ambulatory Visit (INDEPENDENT_AMBULATORY_CARE_PROVIDER_SITE_OTHER): Payer: Medicare Other | Admitting: Unknown Physician Specialty

## 2016-04-13 ENCOUNTER — Encounter: Payer: Self-pay | Admitting: Unknown Physician Specialty

## 2016-04-13 VITALS — BP 126/76 | HR 69 | Temp 98.4°F | Ht 65.8 in | Wt 150.4 lb

## 2016-04-13 DIAGNOSIS — Z800 Family history of malignant neoplasm of digestive organs: Secondary | ICD-10-CM | POA: Diagnosis not present

## 2016-04-13 DIAGNOSIS — IMO0001 Reserved for inherently not codable concepts without codable children: Secondary | ICD-10-CM

## 2016-04-13 DIAGNOSIS — E785 Hyperlipidemia, unspecified: Secondary | ICD-10-CM | POA: Diagnosis not present

## 2016-04-13 DIAGNOSIS — F419 Anxiety disorder, unspecified: Secondary | ICD-10-CM | POA: Diagnosis not present

## 2016-04-13 DIAGNOSIS — R030 Elevated blood-pressure reading, without diagnosis of hypertension: Secondary | ICD-10-CM

## 2016-04-13 MED ORDER — ATORVASTATIN CALCIUM 20 MG PO TABS: 20.0000 mg | ORAL_TABLET | Freq: Every day | ORAL | Status: DC

## 2016-04-13 MED ORDER — ALPRAZOLAM 0.25 MG PO TABS: 0.2500 mg | ORAL_TABLET | Freq: Every evening | ORAL | Status: DC | PRN

## 2016-04-13 MED ORDER — LEVOTHYROXINE SODIUM 75 MCG PO TABS: 75.0000 ug | ORAL_TABLET | Freq: Every day | ORAL | Status: DC

## 2016-04-13 MED ORDER — METOPROLOL SUCCINATE ER 50 MG PO TB24: 45.0000 mg | ORAL_TABLET | Freq: Every day | ORAL | Status: DC

## 2016-04-13 NOTE — Assessment & Plan Note (Signed)
Takes Xanax on occasion

## 2016-04-13 NOTE — Assessment & Plan Note (Signed)
Good numbers today

## 2016-04-13 NOTE — Assessment & Plan Note (Signed)
Check LDL. 

## 2016-04-13 NOTE — Progress Notes (Signed)
BP 126/76 mmHg  Pulse 69  Temp(Src) 98.4 F (36.9 C)  Ht 5' 5.8" (1.671 m)  Wt 150 lb 6.4 oz (68.221 kg)  BMI 24.43 kg/m2  SpO2 97%  LMP  (LMP Unknown)   Subjective:    Patient ID: Savannah [name], female    DOB: 08-Dec-1946, 70 y.o.   MRN: [id number]  HPI: Savannah [name] is a 70 y.o. female  Chief Complaint  Patient presents with  . Medicare Wellness    pt states she knows she is due for a bone density  . Medication Refill    pt would like all medications refilled for a year   Functional Status Survey: Is the patient deaf or have difficulty hearing?: Yes (pt has hearing aid for both ears) Does the patient have difficulty seeing, even when wearing glasses/contacts?: No Does the patient have difficulty concentrating, remembering, or making decisions?: No Does the patient have difficulty walking or climbing stairs?: No Does the patient have difficulty dressing or bathing?: No Does the patient have difficulty doing errands alone such as visiting a doctor's office or shopping?: No  Depression screen [facility] 2/9 04/13/2016 09/16/2015  Decreased Interest 0 0  Down, Depressed, Hopeless 0 0  PHQ - 2 Score 0 0   Hypertension Using medications without difficulty Average home BPs Not checking  No problems or lightheadedness No chest pain with exertion or shortness of breath No Edema  Hyperlipidemia Using medications without problems: No Muscle aches  Diet compliance:good Exercise: Always active and working in the yard all the time.  Planning on walking 4 miles   Anxiety  Takes occasional Xanax.  Had an ER visit due to irregular heart rate and noted to have PVCs and evaluated by Dr. Nehemiah Massed.  Was told it was related to stress.     Relevant past medical, surgical, family and social history reviewed and updated as indicated. Interim medical history since our last visit reviewed. Allergies and medications reviewed and updated.  Review of Systems  Constitutional: Negative.   HENT:  Negative.   Eyes: Negative.   Respiratory: Negative.   Cardiovascular: Negative.   Gastrointestinal: Negative.   Endocrine: Negative.   Genitourinary: Negative.   Musculoskeletal: Negative.   Skin: Negative.   Allergic/Immunologic: Negative.   Neurological: Negative.   Hematological: Negative.   Psychiatric/Behavioral: Negative.     Per HPI unless specifically indicated above     Objective:    BP 126/76 mmHg  Pulse 69  Temp(Src) 98.4 F (36.9 C)  Ht 5' 5.8" (1.671 m)  Wt 150 lb 6.4 oz (68.221 kg)  BMI 24.43 kg/m2  SpO2 97%  LMP  (LMP Unknown)  Wt Readings from Last 3 Encounters:  04/13/16 150 lb 6.4 oz (68.221 kg)  04/12/16 145 lb (65.772 kg)  02/01/16 145 lb (65.772 kg)    Physical Exam  Constitutional: She is oriented to person, place, and time. She appears well-developed and well-nourished. No distress.  HENT:  Head: Normocephalic and atraumatic.  Eyes: Conjunctivae and lids are normal. Right eye exhibits no discharge. Left eye exhibits no discharge. No scleral icterus.  Cardiovascular: Normal rate.   Pulmonary/Chest: Effort normal.  Abdominal: Normal appearance. There is no splenomegaly or hepatomegaly.  Musculoskeletal: Normal range of motion.  Neurological: She is alert and oriented to person, place, and time.  Skin: Skin is intact. No rash noted. No pallor.  Psychiatric: She has a normal mood and affect. Her behavior is normal. Judgment and thought content normal.  Results for orders placed or performed during the hospital encounter of 0000000  Basic metabolic panel  Result Value Ref Range   Sodium 143 135 - 145 mmol/L   Potassium 3.7 3.5 - 5.1 mmol/L   Chloride 106 101 - 111 mmol/L   CO2 30 22 - 32 mmol/L   Glucose, Bld 154 (H) 65 - 99 mg/dL   BUN 14 6 - 20 mg/dL   Creatinine, Ser 0.88 0.44 - 1.00 mg/dL   Calcium 9.3 8.9 - 10.3 mg/dL   GFR calc non Af Amer >60 >60 mL/min   GFR calc Af Amer >60 >60 mL/min   Anion gap 7 5 - 15  CBC  Result Value  Ref Range   WBC 5.2 3.6 - 11.0 K/uL   RBC 4.43 3.80 - 5.20 MIL/uL   Hemoglobin 13.8 12.0 - 16.0 g/dL   HCT 41.7 35.0 - 47.0 %   MCV 94.1 80.0 - 100.0 fL   MCH 31.2 26.0 - 34.0 pg   MCHC 33.1 32.0 - 36.0 g/dL   RDW 13.1 11.5 - 14.5 %   Platelets 165 150 - 440 K/uL  Troponin I  Result Value Ref Range   Troponin I 0.03 <0.031 ng/mL  Magnesium  Result Value Ref Range   Magnesium 1.9 1.7 - 2.4 mg/dL  TSH  Result Value Ref Range   TSH 2.948 0.350 - 4.500 uIU/mL      Assessment & Plan:   Problem List Items Addressed This Visit      Unprioritized   Chronic anxiety    Takes Xanax on occasion      Relevant Medications   ALPRAZolam (XANAX) 0.25 MG tablet   Family history of colon cancer - Primary    Followed by GI      Hyperlipidemia    Check LDL      Relevant Medications   atorvastatin (LIPITOR) 20 MG tablet   metoprolol succinate (TOPROL-XL) 50 MG 24 hr tablet   Other Relevant Orders   Lipid Panel w/o Chol/HDL Ratio   White coat hypertension    Good numbers today      Relevant Medications   atorvastatin (LIPITOR) 20 MG tablet   metoprolol succinate (TOPROL-XL) 50 MG 24 hr tablet       Follow up plan: Return in about 1 year (around 04/13/2017).

## 2016-04-13 NOTE — Assessment & Plan Note (Signed)
Followed by GI

## 2016-04-14 ENCOUNTER — Encounter: Payer: Self-pay | Admitting: Unknown Physician Specialty

## 2016-04-14 LAB — LIPID PANEL W/O CHOL/HDL RATIO
Cholesterol, Total: 172 mg/dL (ref 100–199)
HDL: 76 mg/dL (ref >39)
LDL Calculated: 79 mg/dL (ref 0–99)
TRIGLYCERIDES: 85 mg/dL (ref 0–149)
VLDL Cholesterol Cal: 17 mg/dL (ref 5–40)

## 2016-04-14 LAB — SURGICAL PATHOLOGY

## 2016-05-21 ENCOUNTER — Other Ambulatory Visit: Payer: Self-pay | Admitting: Obstetrics and Gynecology

## 2016-05-21 DIAGNOSIS — Z1231 Encounter for screening mammogram for malignant neoplasm of breast: Secondary | ICD-10-CM

## 2016-07-09 ENCOUNTER — Ambulatory Visit
Admission: RE | Admit: 2016-07-09 | Discharge: 2016-07-09 | Disposition: A | Discharge status: Home / Self Care | Payer: Medicare Other | Source: Ambulatory Visit | Attending: Obstetrics and Gynecology | Admitting: Obstetrics and Gynecology

## 2016-07-09 ENCOUNTER — Other Ambulatory Visit: Payer: Self-pay | Admitting: Obstetrics and Gynecology

## 2016-07-09 DIAGNOSIS — Z1231 Encounter for screening mammogram for malignant neoplasm of breast: Secondary | ICD-10-CM | POA: Diagnosis present

## 2017-04-15 ENCOUNTER — Encounter: Payer: Self-pay | Admitting: Unknown Physician Specialty

## 2017-04-15 ENCOUNTER — Ambulatory Visit (INDEPENDENT_AMBULATORY_CARE_PROVIDER_SITE_OTHER): Payer: Medicare Other | Admitting: Unknown Physician Specialty

## 2017-04-15 VITALS — BP 139/83 | HR 61 | Temp 98.0°F | Ht 65.3 in | Wt 144.9 lb

## 2017-04-15 DIAGNOSIS — R350 Frequency of micturition: Secondary | ICD-10-CM

## 2017-04-15 DIAGNOSIS — F419 Anxiety disorder, unspecified: Secondary | ICD-10-CM

## 2017-04-15 DIAGNOSIS — Z7189 Other specified counseling: Secondary | ICD-10-CM | POA: Insufficient documentation

## 2017-04-15 DIAGNOSIS — G43909 Migraine, unspecified, not intractable, without status migrainosus: Secondary | ICD-10-CM | POA: Diagnosis not present

## 2017-04-15 DIAGNOSIS — Z5181 Encounter for therapeutic drug level monitoring: Secondary | ICD-10-CM | POA: Diagnosis not present

## 2017-04-15 DIAGNOSIS — E039 Hypothyroidism, unspecified: Secondary | ICD-10-CM | POA: Diagnosis not present

## 2017-04-15 DIAGNOSIS — E7800 Pure hypercholesterolemia, unspecified: Secondary | ICD-10-CM | POA: Diagnosis not present

## 2017-04-15 DIAGNOSIS — Z0000 Encounter for general adult medical examination without abnormal findings: Secondary | ICD-10-CM

## 2017-04-15 LAB — UA/M W/RFLX CULTURE, ROUTINE
BILIRUBIN UA: NEGATIVE
GLUCOSE, UA: NEGATIVE
Ketones, UA: NEGATIVE
Leukocytes, UA: NEGATIVE
Nitrite, UA: NEGATIVE
PROTEIN UA: NEGATIVE
Specific Gravity, UA: 1.015 (ref 1.005–1.030)
UUROB: 0.2 mg/dL (ref 0.2–1.0)
pH, UA: 5 (ref 5.0–7.5)

## 2017-04-15 LAB — MICROSCOPIC EXAMINATION
Bacteria, UA: NONE SEEN
RBC, UA: NONE SEEN /hpf (ref 0–?)

## 2017-04-15 MED ORDER — METOPROLOL SUCCINATE ER 50 MG PO TB24: 50.0000 mg | ORAL_TABLET | Freq: Every day | ORAL | 3 refills | Status: DC

## 2017-04-15 MED ORDER — ATORVASTATIN CALCIUM 20 MG PO TABS: 20.0000 mg | ORAL_TABLET | Freq: Every day | ORAL | 3 refills | Status: DC

## 2017-04-15 MED ORDER — ALPRAZOLAM 0.25 MG PO TABS: 0.2500 mg | ORAL_TABLET | Freq: Every evening | ORAL | 0 refills | Status: DC | PRN

## 2017-04-15 MED ORDER — LEVOTHYROXINE SODIUM 75 MCG PO TABS: 75.0000 ug | ORAL_TABLET | Freq: Every day | ORAL | 3 refills | Status: DC

## 2017-04-15 NOTE — Addendum Note (Signed)
Addended by: Kathrine Haddock on: 04/15/2017 11:57 AM   Modules accepted: Orders

## 2017-04-15 NOTE — Progress Notes (Signed)
BP 139/83 (BP Location: Right Arm, Cuff Size: Normal)   Pulse 61   Temp 98 F (36.7 C)   Ht 5' 5.3" (1.659 m)   Wt 144 lb 14.4 oz (65.7 kg)   LMP  (LMP Unknown)   SpO2 99%   BMI 23.89 kg/m    Subjective:    Patient ID: Savannah [name], female    DOB: 05-10-1946, 71 y.o.   MRN: [id number]  HPI: Savannah [name] is a 71 y.o. female  Chief Complaint  Patient presents with  . Medicare Wellness  . Medication Refill    pt states she would like all medications refilled  . Callouses    pt states she would like calluses on left hand looked at, states they do not hurt, they are just there   Functional Status Survey: Is the patient deaf or have difficulty hearing?: Yes (hearing aids for both ears) Does the patient have difficulty seeing, even when wearing glasses/contacts?: No Does the patient have difficulty concentrating, remembering, or making decisions?: No Does the patient have difficulty walking or climbing stairs?: No Does the patient have difficulty dressing or bathing?: No Does the patient have difficulty doing errands alone such as visiting a doctor's office or shopping?: No  Fall Risk  04/15/2017 04/13/2016 09/16/2015  Falls in the past year? No No No   Depression screen [facility] 2/9 04/15/2017 04/13/2016 09/16/2015  Decreased Interest 0 0 0  Down, Depressed, Hopeless 0 0 0  PHQ - 2 Score 0 0 0  Altered sleeping 0 - -  Tired, decreased energy 0 - -  Change in appetite 0 - -  Feeling bad or failure about yourself  0 - -  Trouble concentrating 0 - -  Moving slowly or fidgety/restless 0 - -  Suicidal thoughts 0 - -  PHQ-9 Score 0 - -   Hypertension Using medications without difficulty Average home BPs Not checking  No problems or lightheadedness No chest pain with exertion or shortness of breath No Edema  Hyperlipidemia Using medications without problems: No Muscle aches  Diet compliance/Exercise: "Doing the treadmill" She is dieting  Hypothyroid She has energy and purposeful  weight loss.    Anxiety Takes occasional Xanax but hasn't used for years.  She would like a prescription  Social History   Social History  . Marital status: Married    Spouse name: N/A  . Number of children: N/A  . Years of education: N/A   Occupational History  . Not on file.   Social History Main Topics  . Smoking status: Former Research scientist (life sciences)  . Smokeless tobacco: Never Used  . Alcohol use 8.4 oz/week    14 Glasses of wine per week  . Drug use: No  . Sexual activity: Yes   Other Topics Concern  . Not on file   Social History Narrative  . No narrative on file   Family History  Problem Relation Age of Onset  . Cancer Mother     colon  . Hypertension Mother   . Cancer Father     lung  . Hyperlipidemia Brother   . Breast cancer Neg Hx    Past Medical History:  Diagnosis Date  . Anxiety   . Cancer (HCC)    skin- squamous  . Hyperlipidemia   . Hypertension   . IBS (irritable bowel syndrome)   . MVP (mitral valve prolapse)   . OA (osteoarthritis) of neck   . Osteoporosis   . Thyroid disease  Past Surgical History:  Procedure Laterality Date  . ANKLE FRACTURE SURGERY Left   . BREAST CYST ASPIRATION Right 20 +yrs  . COLONOSCOPY WITH PROPOFOL N/A 04/12/2016   Procedure: COLONOSCOPY WITH PROPOFOL;  Surgeon: Manya Silvas, MD;  Location: [facility] ENDOSCOPY;  Service: Endoscopy;  Laterality: N/A;  . FRACTURE SURGERY    . moses surgery  09/2015  . 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 Interim medical history since our last visit reviewed. Allergies and medications reviewed and updated.  Review of Systems  Per HPI unless specifically indicated above     Objective:    BP 139/83 (BP Location: Right Arm, Cuff Size: Normal)   Pulse 61   Temp 98 F (36.7 C)   Ht 5' 5.3" (1.659 m)   Wt 144 lb 14.4 oz (65.7 kg)   LMP  (LMP Unknown)   SpO2 99%   BMI  23.89 kg/m   Wt Readings from Last 3 Encounters:  04/15/17 144 lb 14.4 oz (65.7 kg)  04/13/16 150 lb 6.4 oz (68.2 kg)  04/12/16 145 lb (65.8 kg)    Physical Exam  Constitutional: She is oriented to person, place, and time. She appears well-developed and well-nourished.  HENT:  Head: Normocephalic and atraumatic.  Eyes: Pupils are equal, round, and reactive to light. Right eye exhibits no discharge. Left eye exhibits no discharge. No scleral icterus.  Neck: Normal range of motion. Neck supple. Carotid bruit is not present. No thyromegaly present.  Cardiovascular: Normal rate, regular rhythm and normal heart sounds.  Exam reveals no gallop and no friction rub.   No murmur heard. Pulmonary/Chest: Effort normal and breath sounds normal. No respiratory distress. She has no wheezes. She has no rales.  Abdominal: Soft. Bowel sounds are normal. There is no tenderness. There is no rebound.  Genitourinary: No breast swelling, tenderness or discharge.  Musculoskeletal: Normal range of motion.  Lymphadenopathy:    She has no cervical adenopathy.  Neurological: She is alert and oriented to person, place, and time.  Skin: Skin is warm, dry and intact. No rash noted.  Psychiatric: She has a normal mood and affect. Her speech is normal and behavior is normal. Judgment and thought content normal. Cognition and memory are normal.    Results for orders placed or performed in visit on 04/13/16  Lipid Panel w/o Chol/HDL Ratio  Result Value Ref Range   Cholesterol, Total 172 100 - 199 mg/dL   Triglycerides 85 0 - 149 mg/dL   HDL 76 >39 mg/dL   VLDL Cholesterol Cal 17 5 - 40 mg/dL   LDL Calculated 79 0 - 99 mg/dL      Assessment & Plan:   Problem List Items Addressed This Visit      Unprioritized   Advance care planning    A voluntary discussion about advance care planning including the explanation and discussion of advance directives was extensively discussed  with the patient.  Explanation about  the health care proxy and Living will was reviewed and packet with forms with explanation of how to fill them out was given.  During this discussion, the patient was able to identify a health care proxy as her husband.  She thinks she has these plans but may update.  Patient was offered a separate [place] visit for further assistance with forms.         Chronic anxiety  Very occasional Xanax and OK for refill today      Relevant Medications   ALPRAZolam (XANAX) 0.25 MG tablet   Hyperlipidemia    Check lipid panel      Relevant Medications   metoprolol succinate (TOPROL-XL) 50 MG 24 hr tablet   atorvastatin (LIPITOR) 20 MG tablet   Other Relevant Orders   Lipid Panel w/o Chol/HDL Ratio   Hypothyroidism    Check thyroid panel      Relevant Medications   levothyroxine (SYNTHROID) 75 MCG tablet   metoprolol succinate (TOPROL-XL) 50 MG 24 hr tablet   Other Relevant Orders   TSH   Migraines    Doing well      Relevant Medications   metoprolol succinate (TOPROL-XL) 50 MG 24 hr tablet   atorvastatin (LIPITOR) 20 MG tablet    Other Visit Diagnoses    Annual physical exam    -  Primary   Relevant Orders   CA 125   Medication monitoring encounter       Relevant Orders   Comprehensive metabolic panel     Pt requesting a CA 125.  She is willing to cover the cost.  +   Follow up plan: Return in about 1 year (around 04/15/2018).

## 2017-04-15 NOTE — Assessment & Plan Note (Signed)
Check thyroid panel 

## 2017-04-15 NOTE — Assessment & Plan Note (Signed)
Doing well 

## 2017-04-15 NOTE — Assessment & Plan Note (Signed)
A voluntary discussion about advance care planning including the explanation and discussion of advance directives was extensively discussed  with the patient.  Explanation about the health care proxy and Living will was reviewed and packet with forms with explanation of how to fill them out was given.  During this discussion, the patient was able to identify a health care proxy as her husband.  She thinks she has these plans but may update.  Patient was offered a separate [place] visit for further assistance with forms.

## 2017-04-15 NOTE — Assessment & Plan Note (Signed)
Check lipid panel  

## 2017-04-15 NOTE — Assessment & Plan Note (Signed)
Very occasional Xanax and OK for refill today

## 2017-04-16 LAB — COMPREHENSIVE METABOLIC PANEL
ALBUMIN: 4.3 g/dL (ref 3.5–4.8)
ALK PHOS: 64 IU/L (ref 39–117)
ALT: 10 IU/L (ref 0–32)
AST: 15 IU/L (ref 0–40)
Albumin/Globulin Ratio: 2 (ref 1.2–2.2)
BUN/Creatinine Ratio: 18 (ref 12–28)
BUN: 12 mg/dL (ref 8–27)
Bilirubin Total: 0.5 mg/dL (ref 0.0–1.2)
CO2: 25 mmol/L (ref 18–29)
CREATININE: 0.68 mg/dL (ref 0.57–1.00)
Calcium: 9.2 mg/dL (ref 8.7–10.3)
Chloride: 104 mmol/L (ref 96–106)
GFR calc Af Amer: 101 mL/min/{1.73_m2} (ref >59)
GFR calc non Af Amer: 88 mL/min/{1.73_m2} (ref >59)
GLUCOSE: 91 mg/dL (ref 65–99)
Globulin, Total: 2.2 g/dL (ref 1.5–4.5)
Potassium: 3.9 mmol/L (ref 3.5–5.2)
Sodium: 144 mmol/L (ref 134–144)
TOTAL PROTEIN: 6.5 g/dL (ref 6.0–8.5)

## 2017-04-16 LAB — LIPID PANEL W/O CHOL/HDL RATIO
Cholesterol, Total: 172 mg/dL (ref 100–199)
HDL: 72 mg/dL (ref >39)
LDL CALC: 83 mg/dL (ref 0–99)
Triglycerides: 84 mg/dL (ref 0–149)
VLDL CHOLESTEROL CAL: 17 mg/dL (ref 5–40)

## 2017-04-16 LAB — CA 125: CA 125: 9 U/mL (ref 0.0–38.1)

## 2017-04-16 LAB — TSH: TSH: 2.95 u[IU]/mL (ref 0.450–4.500)

## 2017-04-17 ENCOUNTER — Emergency Department: Payer: Medicare Other

## 2017-04-17 ENCOUNTER — Encounter: Payer: Self-pay | Admitting: Emergency Medicine

## 2017-04-17 ENCOUNTER — Emergency Department
Admission: EM | Admit: 2017-04-17 | Discharge: 2017-04-17 | Disposition: A | Discharge status: Home / Self Care | Payer: Medicare Other | Attending: Emergency Medicine | Admitting: Emergency Medicine

## 2017-04-17 DIAGNOSIS — K2900 Acute gastritis without bleeding: Secondary | ICD-10-CM

## 2017-04-17 DIAGNOSIS — I10 Essential (primary) hypertension: Secondary | ICD-10-CM | POA: Diagnosis not present

## 2017-04-17 DIAGNOSIS — Z79899 Other long term (current) drug therapy: Secondary | ICD-10-CM | POA: Diagnosis not present

## 2017-04-17 DIAGNOSIS — R1013 Epigastric pain: Secondary | ICD-10-CM

## 2017-04-17 DIAGNOSIS — Z7982 Long term (current) use of aspirin: Secondary | ICD-10-CM | POA: Diagnosis not present

## 2017-04-17 DIAGNOSIS — Z87891 Personal history of nicotine dependence: Secondary | ICD-10-CM | POA: Diagnosis not present

## 2017-04-17 LAB — BASIC METABOLIC PANEL
ANION GAP: 6 (ref 5–15)
BUN: 12 mg/dL (ref 6–20)
CO2: 30 mmol/L (ref 22–32)
Calcium: 9.4 mg/dL (ref 8.9–10.3)
Chloride: 105 mmol/L (ref 101–111)
Creatinine, Ser: 0.82 mg/dL (ref 0.44–1.00)
GFR calc Af Amer: >60 mL/min (ref >60)
GFR calc non Af Amer: >60 mL/min (ref >60)
Glucose, Bld: 85 mg/dL (ref 65–99)
Potassium: 3.8 mmol/L (ref 3.5–5.1)
SODIUM: 141 mmol/L (ref 135–145)

## 2017-04-17 LAB — CBC
HCT: 42.2 % (ref 35.0–47.0)
HEMOGLOBIN: 14.2 g/dL (ref 12.0–16.0)
MCH: 31.9 pg (ref 26.0–34.0)
MCHC: 33.6 g/dL (ref 32.0–36.0)
MCV: 94.7 fL (ref 80.0–100.0)
PLATELETS: 195 10*3/uL (ref 150–440)
RBC: 4.45 MIL/uL (ref 3.80–5.20)
RDW: 13.6 % (ref 11.5–14.5)
WBC: 5.2 10*3/uL (ref 3.6–11.0)

## 2017-04-17 LAB — TROPONIN I

## 2017-04-17 LAB — LIPASE, BLOOD: Lipase: 18 U/L (ref 11–51)

## 2017-04-17 NOTE — Discharge Instructions (Addendum)
Continue taking Pepcid twice daily to control your symptoms.

## 2017-04-17 NOTE — ED Provider Notes (Signed)
[facility] Emergency Department Provider Note  ____________________________________________  Time seen: Approximately 4:25 PM  I have reviewed the triage vital signs and the nursing notes.   HISTORY  Chief Complaint Chest Pain    HPI Savannah [name] is a 71 y.o. female who complains of burning and gnawing epigastric pain for the past 3 days. Contrary to the triage note she denies any shortness of breath. She denies radiation vomiting or diaphoresis. No dizziness. Not exertional, not pleuritic. No aggravating or alleviating factors. Moderate intensity, lasting 2-3 hours at a time when it is present. No triggering events that she can identify.  She reports that she just returned from travel to [place]. There she did [place] hunting which she has done for years. She reports that whenever she does this, she feels an adrenaline surge and her heart gets racing. She counted it at 140 bpm. She did not have any chest pain shortness of breath or dizziness during that time.  During the trip, she reports that shaft where they were staying made a lot of spicy food and she is not accustomed to eating spicy food. She reports that she has a lot of stomach discomfort on the airplane ride back.     Past Medical History:  Diagnosis Date  . Anxiety   . Cancer (HCC)    skin- squamous  . Hyperlipidemia   . Hypertension   . IBS (irritable bowel syndrome)   . MVP (mitral valve prolapse)   . OA (osteoarthritis) of neck   . Osteoporosis   . Thyroid disease      Patient Active Problem List   Diagnosis Date Noted  . Advance care planning 04/15/2017  . Family history of colon cancer 04/13/2016  . Chronic anxiety 04/13/2016  . Hypothyroidism 09/15/2015  . MVP (mitral valve prolapse) 09/15/2015  . Osteoporosis 09/15/2015  . OA (osteoarthritis) of neck 09/15/2015  . Hyperlipidemia 09/15/2015  . White coat hypertension 09/15/2015  . Migraines 09/15/2015     Past Surgical  History:  Procedure Laterality Date  . ANKLE FRACTURE SURGERY Left   . BREAST CYST ASPIRATION Right 20 +yrs  . COLONOSCOPY WITH PROPOFOL N/A 04/12/2016   Procedure: COLONOSCOPY WITH PROPOFOL;  Surgeon: Manya Silvas, MD;  Location: [facility] ENDOSCOPY;  Service: Endoscopy;  Laterality: N/A;  . FRACTURE SURGERY    . moses surgery  09/2015  . 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 Authorizing Provider  ALPRAZolam (XANAX) 0.25 MG tablet Take 1 tablet (0.25 mg total) by mouth at bedtime as needed for anxiety. 04/15/17   Kathrine Haddock, NP  aspirin 81 MG tablet Take 81 mg by mouth daily.    Historical Provider, MD  atorvastatin (LIPITOR) 20 MG tablet Take 1 tablet (20 mg total) by mouth daily at 6 PM. 04/15/17   Kathrine Haddock, NP  levothyroxine (SYNTHROID) 75 MCG tablet Take 1 tablet (75 mcg total) by mouth daily before breakfast. 04/15/17   Kathrine Haddock, NP  metoprolol succinate (TOPROL-XL) 50 MG 24 hr tablet Take 1 tablet (50 mg total) by mouth daily. Take 1/2 tablet daily 04/15/17   Kathrine Haddock, NP  scopolamine (TRANSDERM-SCOP) 1 MG/3DAYS Place 1 patch (1.5 mg total) onto the skin every 3 (three) days. 09/16/15   Kathrine Haddock, NP     Allergies Amoxil [amoxicillin] and Penicillins   Family History  Problem Relation Age of Onset  .  Cancer Mother     colon  . Hypertension Mother   . Cancer Father     lung  . Hyperlipidemia Brother   . Breast cancer Neg Hx     Social History Social History  Substance Use Topics  . Smoking status: Former Research scientist (life sciences)  . Smokeless tobacco: Never Used  . Alcohol use 8.4 oz/week    14 Glasses of wine per week    Review of Systems  Constitutional:   No fever or chills.  ENT:   No sore throat. No rhinorrhea. Lymphatic: No swollen glands, No extremity swelling Endocrine: No hot/cold flashes. No significant weight change. No neck swelling. Cardiovascular:    Negative for chest pain. Negative for syncope. Respiratory:   No dyspnea or cough. Gastrointestinal:   Positive as above for epigastric pain without vomiting and diarrhea.  Genitourinary:   Negative for dysuria or difficulty urinating. Musculoskeletal:   Negative for focal pain or swelling Neurological:   Negative for headaches or weakness. All other systems reviewed and are negative except as documented above in ROS and HPI.  ____________________________________________   PHYSICAL EXAM:  VITAL SIGNS: ED Triage Vitals  Enc Vitals Group     BP 04/17/17 1409 (!) 157/77     Pulse Rate 04/17/17 1409 66     Resp 04/17/17 1409 18     Temp 04/17/17 1409 97.7 F (36.5 C)     Temp Source 04/17/17 1409 Oral     SpO2 04/17/17 1409 97 %     Weight 04/17/17 1409 144 lb (65.3 kg)     Height 04/17/17 1409 5' 5.3" (1.659 m)     Head Circumference --      Peak Flow --      Pain Score 04/17/17 1407 0     Pain Loc --      Pain Edu? --      Excl. in [place]? --     Vital signs reviewed, nursing assessments reviewed.   Constitutional:   Alert and oriented. Well appearing and in no distress. Eyes:   No scleral icterus. No conjunctival pallor. PERRL. EOMI.  No nystagmus. ENT   Head:   Normocephalic and atraumatic.   Nose:   No congestion/rhinnorhea. No septal hematoma   Mouth/Throat:   MMM, no pharyngeal erythema. No peritonsillar mass.    Neck:   No stridor. No SubQ emphysema. No meningismus. Hematological/Lymphatic/Immunilogical:   No cervical lymphadenopathy. Cardiovascular:   RRR. Symmetric bilateral radial and DP pulses.  No murmurs.  Respiratory:   Normal respiratory effort without tachypnea nor retractions. Breath sounds are clear and equal bilaterally. No wheezes/rales/rhonchi. Chest wall nontender Gastrointestinal:   Soft and nontender. Non distended. There is no CVA tenderness.  No rebound, rigidity, or guarding. No pulsatile mass or bruit. No other large mass. Genitourinary:    deferred Musculoskeletal:   Normal range of motion in all extremities. No joint effusions.  No lower extremity tenderness.  No edema. Neurologic:   Normal speech and language.  CN 2-10 normal. Motor grossly intact. No gross focal neurologic deficits are appreciated.  Skin:    Skin is warm, dry and intact. No rash noted.  No petechiae, purpura, or bullae.  ____________________________________________    LABS (pertinent positives/negatives) (all labs ordered are listed, but only abnormal results are displayed) Labs Reviewed  BASIC METABOLIC PANEL  CBC  TROPONIN I  LIPASE, BLOOD  TROPONIN I   ____________________________________________   EKG  Interpreted by me  Date: 04/17/2017  Rate: 65  Rhythm:  normal sinus rhythm  QRS Axis: normal  Intervals: normal  ST/T Wave abnormalities: normal  Conduction Disutrbances: none  Narrative Interpretation: unremarkable      ____________________________________________    RADIOLOGY  Dg Chest 2 View  Result Date: 04/17/2017 CLINICAL DATA:  Shortness of breath, chest pain EXAM: CHEST  2 VIEW COMPARISON:  02/01/2016 FINDINGS: The heart size and mediastinal contours are within normal limits. Both lungs are clear. The visualized skeletal structures are unremarkable. IMPRESSION: No active cardiopulmonary disease. Electronically Signed   By: Kathreen Devoid   On: 04/17/2017 15:06    ____________________________________________   PROCEDURES Procedures  ____________________________________________   INITIAL IMPRESSION / ASSESSMENT AND PLAN / ED COURSE  Pertinent labs & imaging results that were available during my care of the patient were reviewed by me and considered in my medical decision making (see chart for details).    Clinical Course as of Apr 17 1633  Sun Apr 17, 2017  1540 P/w atypical chest pain. Essentially had negative stress test in the form of [place] hunting with marked tachycardia without chest pain or other sx.  Sx suggestive of gastritis with recent travel, stress, and spicy food intake. Check serial trop, plan to DC with antacid regimen if negative.   [PS]    Clinical Course User Index [PS] Carrie Mew, MD  Recently had colonoscopy about a year ago. She recently had cardiology evaluation finding her to be low risk for the procedure. She had an H. pylori test which was negative. An ultrasound of the abdomen a year ago was unremarkable. She has been offered EGD in the past due to family history of GI cancer which she has declined in the past. I will encourage her to recontact her gastroenterology clinic and reconsider given her family history which she is clearly worried about. No evidence of organ dysfunction at this time. No mass in the abdomen.  Considering the patient's symptoms, medical history, and physical examination today, I have low suspicion for cholecystitis or biliary pathology, pancreatitis, perforation or bowel obstruction, hernia, intra-abdominal abscess, AAA or dissection, volvulus or intussusception, mesenteric ischemia, or appendicitis.  Low suspicion of ACS PE dissection. I think this is gastritis and will resolve with a week of Pepcid. There is likely a anxiety component.   ____________________________________________   FINAL CLINICAL IMPRESSION(S) / ED DIAGNOSES  Final diagnoses:  Other acute gastritis without hemorrhage  Epigastric pain      New Prescriptions   No medications on file     Portions of this note were generated with dragon dictation software. Dictation errors may occur despite best attempts at proofreading.    Carrie Mew, MD 04/17/17 [phone]

## 2017-04-17 NOTE — ED Triage Notes (Signed)
Pt presents to ED c/o "uncomfortable, burning" epigastric pain x3 days with shortness of breath. Denies dizziness, lightheadedness, n/v.

## 2017-04-17 NOTE — ED Notes (Signed)
Patient to Xray via stretcher.  AAOx3.  Skin warm and dry.  NAD 

## 2017-04-17 NOTE — ED Notes (Signed)
AAOx3.  Skin warm and dry. NAD.  No SOB/ DOE. 

## 2017-04-17 NOTE — ED Notes (Signed)
AAOx3.  Skin warm and dry.  Ambulates with easy and steady gait. NAD 

## 2017-04-18 NOTE — Progress Notes (Signed)
Notified pt by mychart

## 2017-04-28 DIAGNOSIS — K219 Gastro-esophageal reflux disease without esophagitis: Secondary | ICD-10-CM | POA: Insufficient documentation

## 2017-05-26 ENCOUNTER — Other Ambulatory Visit: Payer: Self-pay | Admitting: Obstetrics and Gynecology

## 2017-05-26 DIAGNOSIS — Z1231 Encounter for screening mammogram for malignant neoplasm of breast: Secondary | ICD-10-CM

## 2017-07-11 ENCOUNTER — Ambulatory Visit
Admission: RE | Admit: 2017-07-11 | Discharge: 2017-07-11 | Disposition: A | Discharge status: Home / Self Care | Payer: Medicare Other | Source: Ambulatory Visit | Attending: Obstetrics and Gynecology | Admitting: Obstetrics and Gynecology

## 2017-07-11 DIAGNOSIS — Z1231 Encounter for screening mammogram for malignant neoplasm of breast: Secondary | ICD-10-CM | POA: Insufficient documentation

## 2017-09-07 ENCOUNTER — Telehealth: Payer: Self-pay | Admitting: Unknown Physician Specialty

## 2017-09-07 NOTE — Telephone Encounter (Signed)
Patient would like for Malachy Mood to send her in a Zpak for her cough and sore throat. She states she has no fever only mainly the cough.  She is going out of town tomorrow.  She would like it called in to [place]

## 2017-09-07 NOTE — Telephone Encounter (Signed)
Routing to provider  

## 2017-09-07 NOTE — Telephone Encounter (Signed)
Per Malachy Mood, patient needs to be seen. Please call and see if she can be seen in the morning before she goes out of town.

## 2017-09-07 NOTE — Telephone Encounter (Signed)
Called patient back to let her know Malachy Mood said she needs to be seen but patient did not want to make an appointment.

## 2017-09-07 NOTE — Telephone Encounter (Signed)
Needs to be seen

## 2017-09-13 ENCOUNTER — Encounter: Payer: Self-pay | Admitting: Unknown Physician Specialty

## 2017-09-13 ENCOUNTER — Ambulatory Visit (INDEPENDENT_AMBULATORY_CARE_PROVIDER_SITE_OTHER): Payer: Medicare Other | Admitting: Unknown Physician Specialty

## 2017-09-13 VITALS — BP 129/85 | HR 84 | Temp 98.5°F | Wt 146.8 lb

## 2017-09-13 DIAGNOSIS — B351 Tinea unguium: Secondary | ICD-10-CM

## 2017-09-13 DIAGNOSIS — J320 Chronic maxillary sinusitis: Secondary | ICD-10-CM

## 2017-09-13 MED ORDER — SULFAMETHOXAZOLE-TRIMETHOPRIM 800-160 MG PO TABS: 1.0000 | ORAL_TABLET | Freq: Two times a day (BID) | ORAL | 0 refills | Status: DC

## 2017-09-13 MED ORDER — FLUTICASONE PROPIONATE 50 MCG/ACT NA SUSP: 2.0000 | Freq: Every day | NASAL | 6 refills | Status: DC

## 2017-09-13 NOTE — Progress Notes (Signed)
BP 129/85 (BP Location: Left Arm, Cuff Size: Normal)   Pulse 84   Temp 98.5 F (36.9 C)   Wt 146 lb 12.8 oz (66.6 kg)   LMP  (LMP Unknown)   SpO2 98%   BMI 24.20 kg/m    Subjective:    Patient ID: Savannah [name], female    DOB: April 03, 1946, 71 y.o.   MRN: [id number]  HPI: Savannah [name] is a 71 y.o. female  Chief Complaint  Patient presents with  . URI    pt states she has been having sinur pressure, left ear pain, drainage and a cough for 8 days. states she went to urgent care on Friday in [place], diagnosed with sinusitis and given z-pack and tessalon pearls   Pt with a history as above. Feels she should never have been on a Zpack and should have been something stronger.  The pressure is the most distressing symptoms  URI   This is a new problem. Episode onset: 9 days ago. The maximum temperature recorded prior to her arrival was 100.4 - 100.9 F. Associated symptoms include rhinorrhea and sinus pain. Associated symptoms comments: Dry cough. She has tried decongestant, antihistamine, increased fluids and sleep for the symptoms. The treatment provided mild relief.    Relevant past medical, surgical, family and social history reviewed and updated as indicated. Interim medical history since our last visit reviewed. Allergies and medications reviewed and updated.  Review of Systems  HENT: Positive for rhinorrhea and sinus pain.     Per HPI unless specifically indicated above     Objective:    BP 129/85 (BP Location: Left Arm, Cuff Size: Normal)   Pulse 84   Temp 98.5 F (36.9 C)   Wt 146 lb 12.8 oz (66.6 kg)   LMP  (LMP Unknown)   SpO2 98%   BMI 24.20 kg/m   Wt Readings from Last 3 Encounters:  09/13/17 146 lb 12.8 oz (66.6 kg)  04/17/17 144 lb (65.3 kg)  04/15/17 144 lb 14.4 oz (65.7 kg)    Physical Exam  Constitutional: She is oriented to person, place, and time. She appears well-developed and well-nourished. No distress.  HENT:  Head: Normocephalic and atraumatic.    Right Ear: Tympanic membrane is erythematous. A middle ear effusion is present.  Left Ear: Tympanic membrane and ear canal normal.  Nose: No rhinorrhea. Right sinus exhibits maxillary sinus tenderness. Right sinus exhibits no frontal sinus tenderness. Left sinus exhibits maxillary sinus tenderness. Left sinus exhibits no frontal sinus tenderness.  Eyes: Conjunctivae and lids are normal. Right eye exhibits no discharge. Left eye exhibits no discharge. No scleral icterus.  Cardiovascular: Normal rate and regular rhythm.   Pulmonary/Chest: Effort normal and breath sounds normal. No respiratory distress.  Abdominal: Normal appearance. There is no splenomegaly or hepatomegaly.  Musculoskeletal: Normal range of motion.  Neurological: She is alert and oriented to person, place, and time.  Skin: Skin is intact. No rash noted. No pallor.  Psychiatric: She has a normal mood and affect. Her behavior is normal. Judgment and thought content normal.     Assessment & Plan:   Problem List Items Addressed This Visit    None    Visit Diagnoses    Maxillary sinusitis, unspecified chronicity    -  Primary   Pt with secondary sinusitis.  Will add Bactrim    Relevant Medications   benzonatate (TESSALON) 200 MG capsule   sulfamethoxazole-trimethoprim (BACTRIM DS,SEPTRA DS) 800-160 MG tablet   fluticasone (FLONASE)  50 MCG/ACT nasal spray   OM (onychomycosis)       left ear   Relevant Medications   sulfamethoxazole-trimethoprim (BACTRIM DS,SEPTRA DS) 800-160 MG tablet       Follow up plan: Return if symptoms worsen or fail to improve.

## 2017-09-19 ENCOUNTER — Telehealth: Payer: Self-pay | Admitting: Unknown Physician Specialty

## 2017-09-19 NOTE — Telephone Encounter (Signed)
Routing to provider  

## 2017-09-19 NOTE — Telephone Encounter (Signed)
It looks like I already tried antibiotics.  I think this needs to be re-evaluated for something like Temporal Arteritis

## 2017-09-19 NOTE — Telephone Encounter (Signed)
I would take Flonase.  If that doesn't help, needs to be seen

## 2017-09-19 NOTE — Telephone Encounter (Signed)
Called and left patient a VM asking for her to please return my call.  

## 2017-09-19 NOTE — Telephone Encounter (Signed)
Patient having pressure in ear and having severe heacdaecs for about 8 days. Would like advice on what she should do.  Please Advise.  Thank you

## 2017-09-20 ENCOUNTER — Ambulatory Visit (INDEPENDENT_AMBULATORY_CARE_PROVIDER_SITE_OTHER): Payer: Medicare Other | Admitting: Unknown Physician Specialty

## 2017-09-20 ENCOUNTER — Encounter: Payer: Self-pay | Admitting: Unknown Physician Specialty

## 2017-09-20 VITALS — BP 130/78 | HR 70 | Temp 98.6°F | Wt 147.2 lb

## 2017-09-20 DIAGNOSIS — H66002 Acute suppurative otitis media without spontaneous rupture of ear drum, left ear: Secondary | ICD-10-CM | POA: Diagnosis not present

## 2017-09-20 DIAGNOSIS — R51 Headache: Secondary | ICD-10-CM | POA: Diagnosis not present

## 2017-09-20 DIAGNOSIS — R519 Headache, unspecified: Secondary | ICD-10-CM

## 2017-09-20 MED ORDER — PREDNISONE 20 MG PO TABS: 40.0000 mg | ORAL_TABLET | Freq: Every day | ORAL | 0 refills | Status: DC

## 2017-09-20 MED ORDER — CEFDINIR 300 MG PO CAPS: 300.0000 mg | ORAL_CAPSULE | Freq: Two times a day (BID) | ORAL | 0 refills | Status: DC

## 2017-09-20 NOTE — Progress Notes (Signed)
BP 130/78   Pulse 70   Temp 98.6 F (37 C)   Wt 147 lb 3.2 oz (66.8 kg)   LMP  (LMP Unknown)   SpO2 98%   BMI 24.27 kg/m    Subjective:    Patient ID: Savannah [name], female    DOB: December 25, 1945, 71 y.o.   MRN: [id number]  HPI: Savannah [name] is a 71 y.o. female  Chief Complaint  Patient presents with  . Headache    pt states she is still having very bad ear pain and headaches, taking sudafed sinus pressure and headache with a little relief   Pt with 8 days of clogged ear feeling.  Having terrible headaches.  Feels better when  She lays down.  The whole left side of face hurts.  She takes Flonase every morning.  Worried about a plane ride.  Last visit treated with Septra for left OM  Relevant past medical, surgical, family and social history reviewed and updated as indicated. Interim medical history since our last visit reviewed. Allergies and medications reviewed and updated.  Review of Systems  Per HPI unless specifically indicated above     Objective:    BP 130/78   Pulse 70   Temp 98.6 F (37 C)   Wt 147 lb 3.2 oz (66.8 kg)   LMP  (LMP Unknown)   SpO2 98%   BMI 24.27 kg/m   Wt Readings from Last 3 Encounters:  09/20/17 147 lb 3.2 oz (66.8 kg)  09/13/17 146 lb 12.8 oz (66.6 kg)  04/17/17 144 lb (65.3 kg)    Physical Exam  Constitutional: She is oriented to person, place, and time. She appears well-developed and well-nourished. No distress.  HENT:  Head: Normocephalic and atraumatic.  Right Ear: Tympanic membrane and ear canal normal.  Left Ear: Ear canal normal. Tympanic membrane is injected.  Nose: Rhinorrhea present. Right sinus exhibits no maxillary sinus tenderness and no frontal sinus tenderness. Left sinus exhibits no maxillary sinus tenderness and no frontal sinus tenderness.  Mouth/Throat: Mucous membranes are normal. Posterior oropharyngeal erythema present.  Eyes: Conjunctivae and lids are normal. Right eye exhibits no discharge. Left eye exhibits no  discharge. No scleral icterus.  Cardiovascular: Normal rate and regular rhythm.   Pulmonary/Chest: Effort normal and breath sounds normal. No respiratory distress.  Abdominal: Normal appearance. There is no splenomegaly or hepatomegaly.  Musculoskeletal: Normal range of motion.  Neurological: She is alert and oriented to person, place, and time.  Skin: Skin is intact. No rash noted. No pallor.  Psychiatric: She has a normal mood and affect. Her behavior is normal. Judgment and thought content normal.    Results for orders placed or performed during the hospital encounter of 60/10/93  Basic metabolic panel  Result Value Ref Range   Sodium 141 135 - 145 mmol/L   Potassium 3.8 3.5 - 5.1 mmol/L   Chloride 105 101 - 111 mmol/L   CO2 30 22 - 32 mmol/L   Glucose, Bld 85 65 - 99 mg/dL   BUN 12 6 - 20 mg/dL   Creatinine, Ser 0.82 0.44 - 1.00 mg/dL   Calcium 9.4 8.9 - 10.3 mg/dL   GFR calc non Af Amer >60 >60 mL/min   GFR calc Af Amer >60 >60 mL/min   Anion gap 6 5 - 15  CBC  Result Value Ref Range   WBC 5.2 3.6 - 11.0 K/uL   RBC 4.45 3.80 - 5.20 MIL/uL   Hemoglobin 14.2 12.0 -  16.0 g/dL   HCT 42.2 35.0 - 47.0 %   MCV 94.7 80.0 - 100.0 fL   MCH 31.9 26.0 - 34.0 pg   MCHC 33.6 32.0 - 36.0 g/dL   RDW 13.6 11.5 - 14.5 %   Platelets 195 150 - 440 K/uL  Troponin I  Result Value Ref Range   Troponin I <0.03 <0.03 ng/mL  Lipase, blood  Result Value Ref Range   Lipase 18 11 - 51 U/L  Troponin I  Result Value Ref Range   Troponin I <0.03 <0.03 ng/mL      Assessment & Plan:   Problem List Items Addressed This Visit    None    Visit Diagnoses    Acute suppurative otitis media of left ear without spontaneous rupture of tympanic membrane, recurrence not specified    -  Primary   Change antibiotic from Septra from [facility].  R/O temporal ateritis.  Planning to fly on Tuesday.  Urgent referral to ENT.  Prednisone 40 mg daily for 5 days   Relevant Medications   cefdinir (OMNICEF) 300 MG  capsule   Other Relevant Orders   Ambulatory referral to ENT   New onset of headaches       left side of head.  Prednisone burst.  R/O temporal arteritis with CRP and ESR   Relevant Orders   Sed Rate (ESR)   C-reactive protein   Ambulatory referral to ENT       Follow up plan: Return if symptoms worsen or fail to improve, for and with ENT.

## 2017-09-20 NOTE — Telephone Encounter (Signed)
Please call and let patient know that Malachy Mood would like for her to come back in to reevaluate her since she has already tried antibiotics. Thanks.

## 2017-09-21 ENCOUNTER — Encounter: Payer: Self-pay | Admitting: Obstetrics and Gynecology

## 2017-09-21 ENCOUNTER — Ambulatory Visit (INDEPENDENT_AMBULATORY_CARE_PROVIDER_SITE_OTHER): Payer: Medicare Other | Admitting: Obstetrics and Gynecology

## 2017-09-21 VITALS — BP 128/84 | Ht 66.0 in | Wt 144.0 lb

## 2017-09-21 DIAGNOSIS — Z13820 Encounter for screening for osteoporosis: Secondary | ICD-10-CM | POA: Diagnosis not present

## 2017-09-21 DIAGNOSIS — Z1339 Encounter for screening examination for other mental health and behavioral disorders: Secondary | ICD-10-CM

## 2017-09-21 DIAGNOSIS — N72 Inflammatory disease of cervix uteri: Secondary | ICD-10-CM

## 2017-09-21 DIAGNOSIS — R87612 Low grade squamous intraepithelial lesion on cytologic smear of cervix (LGSIL): Secondary | ICD-10-CM

## 2017-09-21 DIAGNOSIS — Z01419 Encounter for gynecological examination (general) (routine) without abnormal findings: Secondary | ICD-10-CM | POA: Diagnosis not present

## 2017-09-21 DIAGNOSIS — Z1231 Encounter for screening mammogram for malignant neoplasm of breast: Secondary | ICD-10-CM

## 2017-09-21 DIAGNOSIS — M810 Age-related osteoporosis without current pathological fracture: Secondary | ICD-10-CM | POA: Diagnosis not present

## 2017-09-21 DIAGNOSIS — Z1331 Encounter for screening for depression: Secondary | ICD-10-CM | POA: Diagnosis not present

## 2017-09-21 DIAGNOSIS — B977 Papillomavirus as the cause of diseases classified elsewhere: Secondary | ICD-10-CM | POA: Diagnosis not present

## 2017-09-21 DIAGNOSIS — Z1239 Encounter for other screening for malignant neoplasm of breast: Secondary | ICD-10-CM

## 2017-09-21 LAB — SEDIMENTATION RATE: Sed Rate: 2 mm/hr (ref 0–40)

## 2017-09-21 LAB — C-REACTIVE PROTEIN: CRP: 1.2 mg/L (ref 0.0–4.9)

## 2017-09-21 LAB — HM PAP SMEAR: HM Pap smear: NEGATIVE

## 2017-09-21 NOTE — Progress Notes (Signed)
Gynecology Annual Exam   PCP: Kathrine Haddock, NP   Chief Complaint  Patient presents with  . Annual Exam    History of Present Illness:  [name] is a 71 y.o. whose LMP was No LMP recorded (lmp unknown). Patient is postmenopausal., presents today for her annual examination.    She does not have vasomotor sx. She uses no meds.  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  Denies vaginal bleeding today.  Last mammogram: 1 year  Results were: normal--routine follow-up in 12 months There is no FH of breast cancer. There is no FH of ovarian cancer. The patient does not do self-breast exams.  DEXA: states that it has been a long time and that she was "borderline" with her last one  Tobacco use: denies current use. Alcohol use: social drinker Exercise: moderately active  The patient wears seatbelts: yes.     Past Medical History:  Diagnosis Date  . Anxiety   . Cancer (HCC)    skin- squamous  . Hyperlipidemia   . Hypertension   . IBS (irritable bowel syndrome)   . MVP (mitral valve prolapse)   . OA (osteoarthritis) of neck   . Osteoporosis   . Thyroid disease     Past Surgical History:  Procedure Laterality Date  . ANKLE FRACTURE SURGERY Left   . BREAST CYST ASPIRATION Right 20 +yrs  . COLONOSCOPY WITH PROPOFOL N/A 04/12/2016   Procedure: COLONOSCOPY WITH PROPOFOL;  Surgeon: Manya Silvas, MD;  Location: [facility] ENDOSCOPY;  Service: Endoscopy;  Laterality: N/A;  . FRACTURE SURGERY    . [facility]oses surgery  09/2015  . 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 Authorizing Provider  ALPRAZolam (XANAX) 0.25 MG tablet Take 1 tablet (0.25 mg total) by mouth at bedtime as needed for anxiety. 04/15/17  Yes Kathrine Haddock, NP  aspirin 81 MG tablet Take 81 mg by mouth daily.   Yes [provider]    atorvastatin (LIPITOR) 20 MG tablet Take 1 tablet (20 mg total) by mouth daily at 6 PM. 04/15/17  Yes Kathrine Haddock, NP  cefdinir (OMNICEF) 300 MG capsule Take 1 capsule (300 mg total) by mouth 2 (two) times daily. 09/20/17  Yes Kathrine Haddock, NP  fluticasone (FLONASE) 50 MCG/ACT nasal spray Place 2 sprays into both nostrils daily. 09/13/17  Yes Kathrine Haddock, NP  levothyroxine (SYNTHROID) 75 MCG tablet Take 1 tablet (75 mcg total) by mouth daily before breakfast. 04/15/17  Yes Kathrine Haddock, NP  metoprolol succinate (TOPROL-XL) 50 MG 24 hr tablet Take 1 tablet (50 mg total) by mouth daily. Take 1/2 tablet daily 04/15/17  Yes Kathrine Haddock, NP  omeprazole (PRILOSEC) 20 MG capsule Take 20 mg by mouth daily. 04/28/17 04/28/18 Yes [provider]  predniSONE (DELTASONE) 20 MG tablet Take 2 tablets (40 mg total) by mouth daily with breakfast. 09/20/17  Yes Kathrine Haddock, NP  scopolamine (TRANSDERM-SCOP) 1 MG/3DAYS Place 1 patch (1.5 mg total) onto the skin every 3 (three) days. 09/16/15  Yes Kathrine Haddock, NP    Allergies  Allergen Reactions  . Amoxil [Amoxicillin] Itching  . Penicillins Other (See Comments)    "passed out"    Family History  Problem Relation Age of Onset  . Cancer Mother        colon  .  Hypertension Mother   . Cancer Father        lung  . Hyperlipidemia Brother   . Breast cancer Neg Hx     Social History   Social History  . Marital status: Married    Spouse name: N/A  . Number of children: N/A  . Years of education: N/A   Occupational History  . Not on file.   Social History Main Topics  . Smoking status: Former Research scientist (life sciences)  . Smokeless tobacco: Never Used  . Alcohol use 8.4 oz/week    14 Glasses of wine per week  . Drug use: No  . Sexual activity: Yes   Other Topics Concern  . Not on file   Social History Narrative  . No narrative on file    Review of Systems  Constitutional: Negative.   HENT: Positive for congestion. Negative for ear  discharge, ear pain, hearing loss, nosebleeds, sinus pain, sore throat and tinnitus.   Eyes: Negative.   Respiratory: Negative.  Negative for stridor.   Cardiovascular: Negative.   Gastrointestinal: Negative.   Genitourinary: Negative.   Musculoskeletal: Negative.   Skin: Negative.   Neurological: Negative.   Psychiatric/Behavioral: Negative.      Physical Exam BP 128/84   Ht 5\' 6"  (1.676 m)   Wt 144 lb (65.3 kg)   LMP  (LMP Unknown)   BMI 23.24 kg/m   Physical Exam  Constitutional: She is oriented to person, place, and time. She appears well-developed and well-nourished. No distress.  Genitourinary: Vagina normal and uterus normal. Pelvic exam was performed with patient supine. There is no rash, tenderness or lesion on the right labia. There is no rash, tenderness or lesion on the left labia. Vagina exhibits no lesion. No erythema, tenderness or bleeding in the vagina. No signs of injury around the vagina. Right adnexum does not display mass, does not display tenderness and does not display fullness. Left adnexum does not display mass, does not display tenderness and does not display fullness. Cervix does not exhibit motion tenderness, lesion, discharge or polyp.   Uterus is mobile. Uterus is not enlarged, tender, exhibiting a mass or irregular (is regular).  HENT:  Head: Normocephalic and atraumatic.  Eyes: EOM are normal. No scleral icterus.  Neck: Normal range of motion. Neck supple. No thyromegaly present.  Cardiovascular: Normal rate and regular rhythm.   Pulmonary/Chest: Effort normal and breath sounds normal. No respiratory distress. She has no wheezes. She has no rales. Right breast exhibits no inverted nipple, no mass and no nipple discharge. Left breast exhibits no inverted nipple, no mass and no nipple discharge.  Abdominal: Soft. Bowel sounds are normal. She exhibits no distension and no mass. There is no tenderness. There is no rebound and no guarding.  Musculoskeletal:  Normal range of motion. She exhibits no edema.  Lymphadenopathy:    She has no cervical adenopathy.  Neurological: She is alert and oriented to person, place, and time. No cranial nerve deficit.  Skin: Skin is warm and dry. No erythema.  Psychiatric: She has a normal mood and affect. Her behavior is normal. Judgment normal.   Female chaperone present for pelvic and breast  portions of the physical exam  Results: AUDIT Questionnaire (screen for alcoholism): 8 PHQ-9: 1  Assessment: 71 y.o. No obstetric history on file. female here for routine gynecologic examination.  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 Patient to call [place] to arrange. She understands that it is her responsibility to arrange this. -- Weight screening: normal -- Depression screening negative (PHQ-9) -- Nutrition: normal -- cholesterol screening: per PCP -- osteoporosis screening: due - will order DEXA (personal history of osteoporosis noted in her chart) -- tobacco screening: not using -- alcohol screening: AUDIT questionnaire indicates low-risk usage. -- family history of breast cancer screening: done. not at high risk. -- no evidence of domestic violence or intimate partner violence. --  STD screening: gonorrhea/chlamydia NAAT not collected per patient request. -- pap smear collected  -- flu vaccine per PCP, recommended to patient -- HPV vaccination series: not eligilbe  Prentice Docker, MD 09/23/2017 2:52 PM

## 2017-09-26 ENCOUNTER — Encounter: Payer: Self-pay | Admitting: Obstetrics and Gynecology

## 2017-09-26 ENCOUNTER — Telehealth: Payer: Self-pay | Admitting: Obstetrics and Gynecology

## 2017-09-26 LAB — IGP, APTIMA HPV, RFX 16/18,45
HPV Aptima: NEGATIVE
PAP SMEAR COMMENT: 0

## 2017-09-26 NOTE — Telephone Encounter (Signed)
Patient is aware of DEXA scan on Thursday, 10/27/17 @ 1:40pm @ [facility].

## 2017-09-26 NOTE — Telephone Encounter (Signed)
-----   Message from Will Bonnet, MD sent at 09/23/2017  2:51 PM EDT ----- Regarding: Schedule Dexa Please schedule this patient a DEXA scan.  I have placed the order. Thank you!!

## 2017-10-07 ENCOUNTER — Encounter: Payer: Self-pay | Admitting: Family Medicine

## 2017-10-07 ENCOUNTER — Ambulatory Visit
Admission: RE | Admit: 2017-10-07 | Discharge: 2017-10-07 | Disposition: A | Discharge status: Home / Self Care | Payer: Medicare Other | Source: Ambulatory Visit | Attending: Family Medicine | Admitting: Family Medicine

## 2017-10-07 ENCOUNTER — Telehealth: Payer: Self-pay | Admitting: Family Medicine

## 2017-10-07 ENCOUNTER — Ambulatory Visit (INDEPENDENT_AMBULATORY_CARE_PROVIDER_SITE_OTHER): Payer: Medicare Other | Admitting: Family Medicine

## 2017-10-07 ENCOUNTER — Ambulatory Visit: Payer: Medicare Other | Admitting: Family Medicine

## 2017-10-07 VITALS — BP 143/83 | HR 91 | Temp 98.7°F | Wt 147.0 lb

## 2017-10-07 DIAGNOSIS — M19032 Primary osteoarthritis, left wrist: Secondary | ICD-10-CM | POA: Insufficient documentation

## 2017-10-07 DIAGNOSIS — M79602 Pain in left arm: Secondary | ICD-10-CM | POA: Diagnosis not present

## 2017-10-07 DIAGNOSIS — R9431 Abnormal electrocardiogram [ECG] [EKG]: Secondary | ICD-10-CM | POA: Diagnosis not present

## 2017-10-07 MED ORDER — GABAPENTIN 300 MG PO CAPS: 300.0000 mg | ORAL_CAPSULE | Freq: Three times a day (TID) | ORAL | 1 refills | Status: DC

## 2017-10-07 MED ORDER — NAPROXEN 500 MG PO TABS: 500.0000 mg | ORAL_TABLET | Freq: Two times a day (BID) | ORAL | 0 refills | Status: DC

## 2017-10-07 NOTE — Telephone Encounter (Signed)
Left message to call.

## 2017-10-07 NOTE — Telephone Encounter (Signed)
Please let her know that her shoulder x-ray came back normal with no evidence of degeneration. Unsure what exactly is causing her pain at this time in that area. Wrist x-ray showed some arthritic degeneration so that is likely a severe flare causing her wrist pain right now. Continue as planned and call next week if things aren't improving

## 2017-10-07 NOTE — Progress Notes (Addendum)
BP (!) 143/83   Pulse 91   Temp 98.7 F (37.1 C)   Wt 147 lb (66.7 kg)   LMP  (LMP Unknown)   SpO2 96%   BMI 23.73 kg/m    Subjective:    Patient ID: Savannah [name], female    DOB: [birth year], 71 y.o.   MRN: [id number]  HPI: Savannah [name] is a 71 y.o. female  Chief Complaint  Patient presents with  . Arm Pain    x 3 weeks of left arm pain, burning pain  . Hand Pain    x 2 weeks of left hand pain, throbbing pain. has been trying a wrist splint.   Patient presents with left upper arm burning pain nearly constantly x 3 weeks. No known injury or previous issues. ROM and strength unbothered, nothing seems to trigger it more so. Pt googled her sxs and is slightly concerned about something cardiac going on.   Also now having left wrist severe throbbing pain for about 2 weeks that feels unrelated. Twisting and bending exacerbate the pain. Again, no known injury here or hx of wrist issues. Bought a wrist brace to see if that helped the new left wrist pain. Has not taken anything OTC for these sxs.   Just came off prednisone yesterday for a severe sinus issue. Has been on multiple rounds of it the past 6 weeks. No benefit to these two areas while taking it.   Past Medical History:  Diagnosis Date  . Anxiety   . Cancer (HCC)    skin- squamous  . Hyperlipidemia   . Hypertension   . IBS (irritable bowel syndrome)   . MVP (mitral valve prolapse)   . OA (osteoarthritis) of neck   . Osteoporosis   . Thyroid disease    Social History   Social History  . Marital status: Married    Spouse name: N/A  . Number of children: N/A  . Years of education: N/A   Occupational History  . Not on file.   Social History Main Topics  . Smoking status: Former Research scientist (life sciences)  . Smokeless tobacco: Never Used  . Alcohol use 8.4 oz/week    14 Glasses of wine per week  . Drug use: No  . Sexual activity: Yes   Other Topics Concern  . Not on file   Social History Narrative  . No narrative on file     Relevant past medical, surgical, family and social history reviewed and updated as indicated. Interim medical history since our last visit reviewed. Allergies and medications reviewed and updated.  Review of Systems  Constitutional: Negative.   HENT: Negative.   Respiratory: Negative.   Cardiovascular: Negative.   Gastrointestinal: Negative.   Musculoskeletal: Positive for arthralgias.  Neurological: Negative.   Psychiatric/Behavioral: Negative.    Per HPI unless specifically indicated above     Objective:    BP (!) 143/83   Pulse 91   Temp 98.7 F (37.1 C)   Wt 147 lb (66.7 kg)   LMP  (LMP Unknown)   SpO2 96%   BMI 23.73 kg/m   Wt Readings from Last 3 Encounters:  10/07/17 147 lb (66.7 kg)  09/21/17 144 lb (65.3 kg)  09/20/17 147 lb 3.2 oz (66.8 kg)    Physical Exam  Constitutional: She is oriented to person, place, and time. She appears well-developed and well-nourished. No distress.  HENT:  Head: Atraumatic.  Eyes: Pupils are equal, round, and reactive to light. Conjunctivae are normal.  Neck: Normal range of motion. Neck supple.  Cardiovascular: Normal rate and normal heart sounds.   Pulmonary/Chest: Effort normal and breath sounds normal. No respiratory distress.  Musculoskeletal: She exhibits tenderness (over left deltoid). She exhibits no edema or deformity.  Antalgic but intact ROM left UE  Good grip strength b/l hands  Neurological: She is alert and oriented to person, place, and time.  Sensation full and equal b/l UEs  Skin: Skin is warm and dry.  Psychiatric: She has a normal mood and affect. Her behavior is normal.  Nursing note and vitals reviewed.  EKG: NSR at 83 bpm with possible q waves in anterolateral leads that have not been present in prior EKGs as recent as 04/18/17. No acute ST changes.      Assessment & Plan:   Problem List Items Addressed This Visit    None    Visit Diagnoses    Left arm pain    -  Primary   Will obtain left  shoulder and wrist x-rays. Start naproxen, gabapentin, lidoderm patches prn, rest. Await results   Relevant Orders   EKG 12-Lead (Completed)   DG Wrist Complete Left (Completed)   DG Shoulder Left (Completed)   Abnormal EKG       Showing possible prior infarct with previous EKGs being WNL. Sees Cardiology for valve dz, discussed following up sometime in the next few months for eval    Reassured pt that EKG changes were nothing acute and that her sxs were musculoskeletal.    Follow up plan: Return if symptoms worsen or fail to improve.

## 2017-10-10 NOTE — Telephone Encounter (Signed)
Patient notified of results.

## 2017-10-10 NOTE — Patient Instructions (Signed)
Follow up as needed

## 2017-10-10 NOTE — Addendum Note (Signed)
Addended by: Merrie Roof E on: 10/10/2017 08:35 AM   Modules accepted: Level of Service

## 2017-10-17 ENCOUNTER — Other Ambulatory Visit: Payer: Self-pay | Admitting: Unknown Physician Specialty

## 2017-10-18 ENCOUNTER — Telehealth: Payer: Self-pay | Admitting: Unknown Physician Specialty

## 2017-10-18 NOTE — Telephone Encounter (Signed)
Copied from [place] #[id number]. Topic: Quick Communication - See Telephone Encounter >> Oct 18, 2017  1:05 PM Burnis Medin, NT wrote: CRM for notification. See Telephone encounter for:  10/18/17.

## 2017-10-18 NOTE — Telephone Encounter (Signed)
Not sure which medication patient is requesting a refill on. Tried reaching patient to ask her but she did not answer so I left a VM asking for her to please return my call. OK for PEC to speak with patient and find out which medication she is needing to be refilled.

## 2017-10-19 ENCOUNTER — Ambulatory Visit: Payer: Self-pay | Admitting: *Deleted

## 2017-10-19 NOTE — Telephone Encounter (Deleted)
Left a voicemail asking which medication she is requesting a refill for.   Asked to please call the office with the name of the medication she needs refilled.

## 2017-10-19 NOTE — Telephone Encounter (Signed)
Attempted to reach pt for clarification on which medication she needs refilled.   Left a voicemail to call the office with the information.

## 2017-10-20 HISTORY — PX: SINUS SURGERY WITH INSTATRAK: SHX5215

## 2017-10-27 ENCOUNTER — Other Ambulatory Visit: Payer: Medicare Other

## 2017-11-01 ENCOUNTER — Ambulatory Visit
Admission: RE | Admit: 2017-11-01 | Discharge: 2017-11-01 | Disposition: A | Discharge status: Home / Self Care | Payer: Medicare Other | Source: Ambulatory Visit | Attending: Obstetrics and Gynecology | Admitting: Obstetrics and Gynecology

## 2017-11-01 DIAGNOSIS — Z01419 Encounter for gynecological examination (general) (routine) without abnormal findings: Secondary | ICD-10-CM

## 2017-11-01 DIAGNOSIS — M8588 Other specified disorders of bone density and structure, other site: Secondary | ICD-10-CM | POA: Insufficient documentation

## 2017-11-01 DIAGNOSIS — M810 Age-related osteoporosis without current pathological fracture: Secondary | ICD-10-CM

## 2017-11-02 ENCOUNTER — Telehealth: Payer: Self-pay | Admitting: Unknown Physician Specialty

## 2017-11-02 DIAGNOSIS — J323 Chronic sphenoidal sinusitis: Secondary | ICD-10-CM | POA: Insufficient documentation

## 2017-11-02 MED ORDER — IBANDRONATE SODIUM 150 MG PO TABS: 150.0000 mg | ORAL_TABLET | ORAL | 3 refills | Status: DC

## 2017-11-02 NOTE — Telephone Encounter (Signed)
Discussed Osteoporosis with pt.  Will start Boniva once a month.  Preparing for sinus surgery and probably won't start until after surgery

## 2018-02-01 ENCOUNTER — Other Ambulatory Visit: Payer: Self-pay

## 2018-02-01 ENCOUNTER — Other Ambulatory Visit: Payer: Self-pay | Admitting: Podiatry

## 2018-02-06 NOTE — Discharge Instructions (Signed)
[facility] °[facility] ° °POST OPERATIVE INSTRUCTIONS FOR DR. TROXLER AND DR. FOWLER °[facility] ° ° °1. Take your medication as prescribed.  Pain medication should be taken only as needed. ° °2. Keep the dressing clean, dry and intact. ° °3. Keep your foot elevated above the heart level for the first 48 hours. ° °4. Walking to the bathroom and brief periods of walking are acceptable, unless we have instructed you to be non-weight bearing. ° °5. Always wear your post-op shoe when walking.  Always use your crutches if you are to be non-weight bearing. ° °6. Do not take a shower. Baths are permissible as long as the foot is kept out of the water.  ° °7. Every hour you are awake:  °- Bend your knee 15 times. °- Flex foot 15 times °- Massage calf 15 times ° °8. Call [facility] ([phone]) if any of the following problems occur: °- You develop a temperature or fever. °- The bandage becomes saturated with blood. °- Medication does not stop your pain. °- Injury of the foot occurs. °- Any symptoms of infection including redness, odor, or red streaks running from wound. ° ° °General Anesthesia, Adult, Care After °These instructions provide you with information about caring for yourself after your procedure. Your health care provider may also give you more specific instructions. Your treatment has been planned according to current medical practices, but problems sometimes occur. Call your health care provider if you have any problems or questions after your procedure. °What can I expect after the procedure? °After the procedure, it is common to have: °· Vomiting. °· A sore throat. °· Mental slowness. ° °It is common to feel: °· Nauseous. °· Cold or shivery. °· Sleepy. °· Tired. °· Sore or achy, even in parts of your body where you did not have surgery. ° °Follow these instructions at home: °For at least 24 hours after the procedure: °· Do not: °? Participate in  activities where you could fall or become injured. °? Drive. °? Use heavy machinery. °? Drink alcohol. °? Take sleeping pills or medicines that cause drowsiness. °? Make important decisions or sign legal documents. °? Take care of children on your own. °· Rest. °Eating and drinking °· If you vomit, drink water, juice, or soup when you can drink without vomiting. °· Drink enough fluid to keep your urine clear or pale yellow. °· Make sure you have little or no nausea before eating solid foods. °· Follow the diet recommended by your health care provider. °General instructions °· Have a responsible adult stay with you until you are awake and alert. °· Return to your normal activities as told by your health care provider. Ask your health care provider what activities are safe for you. °· Take over-the-counter and prescription medicines only as told by your health care provider. °· If you smoke, do not smoke without supervision. °· Keep all follow-up visits as told by your health care provider. This is important. °Contact a health care provider if: °· You continue to have nausea or vomiting at home, and medicines are not helpful. °· You cannot drink fluids or start eating again. °· You cannot urinate after 8-12 hours. °· You develop a skin rash. °· You have fever. °· You have increasing redness at the site of your procedure. °Get help right away if: °· You have difficulty breathing. °· You have chest pain. °· You have unexpected bleeding. °· You feel that you   are having a life-threatening or urgent problem. °This information is not intended to replace advice given to you by your health care provider. Make sure you discuss any questions you have with your health care provider. °Document Released: 03/14/2001 Document Revised: 05/10/2016 Document Reviewed: 11/20/2015 °Elsevier Interactive Patient Education © 2018 [employer] ° °

## 2018-02-08 ENCOUNTER — Ambulatory Visit
Admission: RE | Admit: 2018-02-08 | Discharge: 2018-02-08 | Disposition: A | Discharge status: Home / Self Care | Payer: Medicare Other | Source: Ambulatory Visit | Attending: Podiatry | Admitting: Podiatry

## 2018-02-08 ENCOUNTER — Encounter
Admission: RE | Disposition: A | Discharge status: Home / Self Care | Payer: Self-pay | Source: Ambulatory Visit | Attending: Podiatry

## 2018-02-08 ENCOUNTER — Ambulatory Visit: Payer: Medicare Other | Admitting: Anesthesiology

## 2018-02-08 DIAGNOSIS — M1990 Unspecified osteoarthritis, unspecified site: Secondary | ICD-10-CM | POA: Insufficient documentation

## 2018-02-08 DIAGNOSIS — F419 Anxiety disorder, unspecified: Secondary | ICD-10-CM | POA: Insufficient documentation

## 2018-02-08 DIAGNOSIS — M7989 Other specified soft tissue disorders: Secondary | ICD-10-CM | POA: Diagnosis not present

## 2018-02-08 DIAGNOSIS — K589 Irritable bowel syndrome without diarrhea: Secondary | ICD-10-CM | POA: Insufficient documentation

## 2018-02-08 DIAGNOSIS — Z87891 Personal history of nicotine dependence: Secondary | ICD-10-CM | POA: Diagnosis not present

## 2018-02-08 DIAGNOSIS — M898X7 Other specified disorders of bone, ankle and foot: Secondary | ICD-10-CM | POA: Insufficient documentation

## 2018-02-08 DIAGNOSIS — E039 Hypothyroidism, unspecified: Secondary | ICD-10-CM | POA: Diagnosis not present

## 2018-02-08 DIAGNOSIS — R2241 Localized swelling, mass and lump, right lower limb: Secondary | ICD-10-CM | POA: Diagnosis present

## 2018-02-08 DIAGNOSIS — K219 Gastro-esophageal reflux disease without esophagitis: Secondary | ICD-10-CM | POA: Insufficient documentation

## 2018-02-08 DIAGNOSIS — R011 Cardiac murmur, unspecified: Secondary | ICD-10-CM | POA: Diagnosis not present

## 2018-02-08 HISTORY — PX: HAMMER TOE SURGERY: SHX385

## 2018-02-08 HISTORY — DX: Unspecified hearing loss, unspecified ear: H91.90

## 2018-02-08 HISTORY — DX: Hypothyroidism, unspecified: E03.9

## 2018-02-08 SURGERY — CORRECTION, HAMMER TOE
Anesthesia: General | Laterality: Right | Wound class: Clean

## 2018-02-08 MED ORDER — CLINDAMYCIN PHOSPHATE 900 MG/50ML IV SOLN
900.0000 mg | INTRAVENOUS | Status: AC
  Administered 2018-02-08: 900 mg via INTRAVENOUS

## 2018-02-08 MED ORDER — POVIDONE-IODINE 7.5 % EX SOLN: Freq: Once | CUTANEOUS | Status: DC

## 2018-02-08 MED ORDER — FENTANYL CITRATE (PF) 100 MCG/2ML IJ SOLN: 25.0000 ug | INTRAMUSCULAR | Status: DC | PRN

## 2018-02-08 MED ORDER — LIDOCAINE HCL (CARDIAC) 20 MG/ML IV SOLN
INTRAVENOUS | Status: DC | PRN
  Administered 2018-02-08: 40 mg via INTRAVENOUS

## 2018-02-08 MED ORDER — ACETAMINOPHEN 10 MG/ML IV SOLN: 1000.0000 mg | Freq: Once | INTRAVENOUS | Status: DC | PRN

## 2018-02-08 MED ORDER — FENTANYL CITRATE (PF) 100 MCG/2ML IJ SOLN
INTRAMUSCULAR | Status: DC | PRN
  Administered 2018-02-08 (×2): 50 ug via INTRAVENOUS

## 2018-02-08 MED ORDER — LACTATED RINGERS IV SOLN: INTRAVENOUS | Status: DC

## 2018-02-08 MED ORDER — LIDOCAINE HCL (PF) 1 % IJ SOLN
INTRAMUSCULAR | Status: DC | PRN
  Administered 2018-02-08: 2.5 mL

## 2018-02-08 MED ORDER — MIDAZOLAM HCL 2 MG/2ML IJ SOLN
INTRAMUSCULAR | Status: DC | PRN
  Administered 2018-02-08: 2 mg via INTRAVENOUS

## 2018-02-08 MED ORDER — OXYCODONE HCL 5 MG PO TABS: 5.0000 mg | ORAL_TABLET | Freq: Once | ORAL | Status: DC | PRN

## 2018-02-08 MED ORDER — BUPIVACAINE HCL (PF) 0.5 % IJ SOLN
INTRAMUSCULAR | Status: DC | PRN
  Administered 2018-02-08: 2.5 mL

## 2018-02-08 MED ORDER — LACTATED RINGERS IV SOLN
INTRAVENOUS | Status: DC
  Administered 2018-02-08: 14:00:00 via INTRAVENOUS

## 2018-02-08 MED ORDER — HYDROCODONE-ACETAMINOPHEN 5-325 MG PO TABS: 1.0000 | ORAL_TABLET | Freq: Four times a day (QID) | ORAL | 0 refills | Status: DC | PRN

## 2018-02-08 MED ORDER — PROPOFOL 500 MG/50ML IV EMUL
INTRAVENOUS | Status: DC | PRN
  Administered 2018-02-08: 50 ug/kg/min via INTRAVENOUS

## 2018-02-08 MED ORDER — EPHEDRINE SULFATE 50 MG/ML IJ SOLN
INTRAMUSCULAR | Status: DC | PRN
  Administered 2018-02-08: 10 mg via INTRAVENOUS
  Administered 2018-02-08: 5 mg via INTRAVENOUS

## 2018-02-08 MED ORDER — OXYCODONE HCL 5 MG/5ML PO SOLN: 5.0000 mg | Freq: Once | ORAL | Status: DC | PRN

## 2018-02-08 MED ORDER — ONDANSETRON HCL 4 MG/2ML IJ SOLN: 4.0000 mg | Freq: Once | INTRAMUSCULAR | Status: DC | PRN

## 2018-02-08 SURGICAL SUPPLY — 46 items
BANDAGE ELASTIC 4 VELCRO NS (GAUZE/BANDAGES/DRESSINGS) ×2 IMPLANT
BENZOIN TINCTURE PRP APPL 2/3 (GAUZE/BANDAGES/DRESSINGS) ×2 IMPLANT
BLADE MED AGGRESSIVE (BLADE) IMPLANT
BLADE OSC/SAGITTAL MD 5.5X18 (BLADE) IMPLANT
BLADE SURG 15 STRL LF DISP TIS (BLADE) IMPLANT
BLADE SURG 15 STRL SS (BLADE)
BNDG COHESIVE 4X5 TAN STRL (GAUZE/BANDAGES/DRESSINGS) ×2 IMPLANT
BNDG ESMARK 4X12 TAN STRL LF (GAUZE/BANDAGES/DRESSINGS) ×2 IMPLANT
BNDG GAUZE 4.5X4.1 6PLY STRL (MISCELLANEOUS) ×2 IMPLANT
BNDG STRETCH 4X75 STRL LF (GAUZE/BANDAGES/DRESSINGS) ×2 IMPLANT
CANISTER SUCT 1200ML W/VALVE (MISCELLANEOUS) ×2 IMPLANT
COVER LIGHT HANDLE UNIVERSAL (MISCELLANEOUS) ×4 IMPLANT
CUFF TOURN SGL QUICK 18 (TOURNIQUET CUFF) ×2 IMPLANT
DRAPE FLUOR MINI C-ARM 54X84 (DRAPES) ×2 IMPLANT
DURAPREP 26ML APPLICATOR (WOUND CARE) ×2 IMPLANT
ELECT REM PT RETURN 9FT ADLT (ELECTROSURGICAL) ×2
ELECTRODE REM PT RTRN 9FT ADLT (ELECTROSURGICAL) ×1 IMPLANT
GAUZE PETRO XEROFOAM 1X8 (MISCELLANEOUS) ×2 IMPLANT
GAUZE SPONGE 4X4 12PLY STRL (GAUZE/BANDAGES/DRESSINGS) ×2 IMPLANT
GLOVE BIO SURGEON STRL SZ7.5 (GLOVE) ×2 IMPLANT
GLOVE INDICATOR 8.0 STRL GRN (GLOVE) ×2 IMPLANT
GOWN STRL REUS W/ TWL LRG LVL3 (GOWN DISPOSABLE) ×2 IMPLANT
GOWN STRL REUS W/TWL LRG LVL3 (GOWN DISPOSABLE) ×2
K-WIRE DBL END TROCAR 6X.045 (WIRE)
K-WIRE DBL END TROCAR 6X.062 (WIRE)
KIT TURNOVER KIT A (KITS) ×2 IMPLANT
KWIRE DBL END TROCAR 6X.045 (WIRE) IMPLANT
KWIRE DBL END TROCAR 6X.062 (WIRE) IMPLANT
NS IRRIG 500ML POUR BTL (IV SOLUTION) ×2 IMPLANT
PACK EXTREMITY ARMC (MISCELLANEOUS) ×2 IMPLANT
PIN BALLS 3/8 F/.045 WIRE (MISCELLANEOUS) ×2 IMPLANT
RASP SM TEAR CROSS CUT (RASP) ×2 IMPLANT
STOCKINETTE IMPERVIOUS LG (DRAPES) ×2 IMPLANT
STRAP BODY AND KNEE 60X3 (MISCELLANEOUS) ×2 IMPLANT
STRIP CLOSURE SKIN 1/4X4 (GAUZE/BANDAGES/DRESSINGS) IMPLANT
SUT ETHILON 4-0 (SUTURE)
SUT ETHILON 4-0 FS2 18XMFL BLK (SUTURE)
SUT ETHILON 5-0 FS-2 18 BLK (SUTURE) IMPLANT
SUT MNCRL 5-0+ PC-1 (SUTURE) ×1 IMPLANT
SUT MONOCRYL 5-0 (SUTURE) ×1
SUT VIC AB 2-0 SH 27 (SUTURE) ×1
SUT VIC AB 2-0 SH 27XBRD (SUTURE) ×1 IMPLANT
SUT VIC AB 3-0 SH 27 (SUTURE)
SUT VIC AB 3-0 SH 27X BRD (SUTURE) IMPLANT
SUT VIC AB 4-0 FS2 27 (SUTURE) IMPLANT
SUTURE ETHLN 4-0 FS2 18XMF BLK (SUTURE) IMPLANT

## 2018-02-08 NOTE — H&P (Signed)
HISTORY AND PHYSICAL INTERVAL NOTE:  02/08/2018  1:56 PM  Savannah [name]  has presented today for surgery, with the diagnosis of Foot pain Aquired Hammer toe.  The various methods of treatment have been discussed with the patient.  No guarantees were given.  After consideration of risks, benefits and other options for treatment, the patient has consented to surgery.  I have reviewed the patients' chart and labs.    Patient Vitals for the past 24 hrs:  BP Temp Pulse SpO2 Weight  02/08/18 1313 (!) 144/69 (!) 97.3 F (36.3 C) (!) 54 100 % 67.1 kg (148 lb)    [name] history and physical examination was performed in my office.  The patient was reexamined.  There have been no changes to this history and physical examination.  Savannah [name]

## 2018-02-08 NOTE — Anesthesia Preprocedure Evaluation (Signed)
Anesthesia Evaluation  Patient identified by MRN, date of birth, ID band Patient awake    Reviewed: Allergy & Precautions, NPO status , Patient's Chart, lab work & pertinent test results  History of Anesthesia Complications Negative for: history of anesthetic complications  Airway Mallampati: I  TM Distance: >3 FB Neck ROM: Full    Dental   Bridges :   Pulmonary former smoker (quit 1969),  Snoring    Pulmonary exam normal breath sounds clear to auscultation       Cardiovascular Exercise Tolerance: Good hypertension (white coat), Normal cardiovascular exam+ Valvular Problems/Murmurs MR  Rhythm:Regular Rate:Normal     Neuro/Psych  Headaches, PSYCHIATRIC DISORDERS Anxiety    GI/Hepatic GERD  Controlled,IBS   Endo/Other  Hypothyroidism   Renal/GU negative Renal ROS     Musculoskeletal  (+) Arthritis , Osteoarthritis,    Abdominal   Peds  Hematology negative hematology ROS (+)   Anesthesia Other Findings   Reproductive/Obstetrics                             Anesthesia Physical Anesthesia Plan  ASA: II  Anesthesia Plan: General   Post-op Pain Management:    Induction: Intravenous  PONV Risk Score and Plan: 2 and TIVA and Propofol infusion  Airway Management Planned: Natural Airway  Additional Equipment:   Intra-op Plan:   Post-operative Plan:   Informed Consent: I have reviewed the patients History and Physical, chart, labs and discussed the procedure including the risks, benefits and alternatives for the proposed anesthesia with the patient or authorized representative who has indicated his/her understanding and acceptance.     Plan Discussed with: CRNA  Anesthesia Plan Comments:         Anesthesia Quick Evaluation

## 2018-02-08 NOTE — Anesthesia Postprocedure Evaluation (Signed)
Anesthesia Post Note  Patient: Savannah [name]  Procedure(s) Performed: HAMMER TOE CORRECTION-5TH TOE, Excision soft tissue mass dorsal lateral right fifth toe and exostectomy dorsal lateral DIPJ fifth toe (Right )  Patient location during evaluation: PACU Anesthesia Type: General Level of consciousness: awake and alert, oriented and patient cooperative Pain management: pain level controlled Vital Signs Assessment: post-procedure vital signs reviewed and stable Respiratory status: spontaneous breathing, nonlabored ventilation and respiratory function stable Cardiovascular status: blood pressure returned to baseline and stable Postop Assessment: adequate PO intake Anesthetic complications: no    Darrin Nipper

## 2018-02-08 NOTE — Transfer of Care (Signed)
Immediate Anesthesia Transfer of Care Note  Patient: Savannah [name]  Procedure(s) Performed: HAMMER TOE CORRECTION-5TH TOE (Right )  Patient Location: PACU  Anesthesia Type: General  Level of Consciousness: awake, alert  and patient cooperative  Airway and Oxygen Therapy: Patient Spontanous Breathing and Patient connected to supplemental oxygen  Post-op Assessment: Post-op Vital signs reviewed, Patient's Cardiovascular Status Stable, Respiratory Function Stable, Patent Airway and No signs of Nausea or vomiting  Post-op Vital Signs: Reviewed and stable  Complications: No apparent anesthesia complications

## 2018-02-08 NOTE — Anesthesia Procedure Notes (Signed)
Performed by: Reem Fleury, CRNA Pre-anesthesia Checklist: Patient identified, Emergency Drugs available, Suction available, Patient being monitored and Timeout performed Patient Re-evaluated:Patient Re-evaluated prior to induction Oxygen Delivery Method: Simple face mask Placement Confirmation: positive ETCO2 and breath sounds checked- equal and bilateral       

## 2018-02-08 NOTE — Op Note (Signed)
Operative note   Surgeon:Klea Nall Lawyer: None    Preop diagnosis: Soft tissue mass with exostosis dorsal lateral right fifth toe    Postop diagnosis: Same    Procedure: Excision soft tissue mass dorsal lateral right fifth toe and exostectomy dorsal lateral DIPJ fifth toe    EBL: Minimal    Anesthesia:local and MAC    Hemostasis: Ankle tourniquet inflated to 200 mmHg for 27 minutes    Specimen: Small soft tissue mass dorsal lateral right fifth toe    Complications: None    Operative indications:Shasta H Wollam is an 72 y.o. that presents today for surgical intervention.  The risks/benefits/alternatives/complications have been discussed and consent has been given.    Procedure:  Patient was brought into the OR and placed on the operating table in thesupine position. After anesthesia was obtained theright lower extremity was prepped and draped in usual sterile fashion.  Attention was directed to the distal dorsal aspect of the fifth toe where a longitudinal incision was made just proximal to the nail.  Sharp and blunt dissection carried down through the skin.  There was noted to be thickened fibrotic type of tissue overlying the entire DIPJ.  Excisional removal of this down to the long extensor tendon was performed and this was sent for pathological examination.  The long extensor tendon was noted to be minimal to the distal portion of the middle phalanx.  This was found to be intact at the PIPJ.  At this time the bone was then exposed of the middle phalanx DIP and PIP joints.  This was then smoothed with a power rasp.  The wound was flushed with copious amounts of irrigation.  The long extensor was repaired with a 4-0 Vicryl in the subcutaneous tissue repaired with a 4-0 Vicryl.  The skin was repaired with 5-0 nylon.  Bulky sterile dressing was applied.    Patient tolerated the procedure and anesthesia well.  Was transported from the OR to the PACU with all vital signs stable and  vascular status intact. To be discharged per routine protocol.  Will follow up in approximately 1 week in the outpatient clinic.

## 2018-02-09 ENCOUNTER — Encounter: Payer: Self-pay | Admitting: Podiatry

## 2018-02-10 LAB — SURGICAL PATHOLOGY

## 2018-02-22 ENCOUNTER — Telehealth: Payer: Self-pay | Admitting: Obstetrics and Gynecology

## 2018-02-22 NOTE — Telephone Encounter (Signed)
Left generic VM.  Need to discuss finding of osteoporosis.  Her medicine list shows she has been prescribed a medication for osteoporosis, which she is not taking.

## 2018-02-23 NOTE — Telephone Encounter (Signed)
Patient is returning missed call from [facility] . Please advise

## 2018-02-28 NOTE — Telephone Encounter (Signed)
Please let me know when this patient returns my call. Thank you!

## 2018-03-01 ENCOUNTER — Telehealth: Payer: Self-pay

## 2018-03-01 DIAGNOSIS — M810 Age-related osteoporosis without current pathological fracture: Secondary | ICD-10-CM

## 2018-03-01 MED ORDER — IBANDRONATE SODIUM 150 MG PO TABS
150.0000 mg | ORAL_TABLET | ORAL | 3 refills | Status: DC
Start: 2018-03-01 — End: 2018-03-01

## 2018-03-01 MED ORDER — IBANDRONATE SODIUM 150 MG PO TABS
ORAL_TABLET | ORAL | 3 refills | Status: DC
Start: 2018-03-01 — End: 2019-03-06

## 2018-03-01 NOTE — Telephone Encounter (Signed)
Pt states her PCP has put her on Boniva and pt is aware of the results.

## 2018-03-01 NOTE — Telephone Encounter (Signed)
Called to schedule awv with patient. While on the phone patient inquired if she could have her Savannah [name] resent to [employer]. She states this was prescribed last November after her bone density but due to other medications she was going to be on for a couple surgeries she wanted to postpone starting it. She states she is ready to start it now.   [facility], the medication is not on file anymore- will need new rx.   Verbal order from Marko Stai, sent to [facility].

## 2018-03-28 ENCOUNTER — Encounter: Payer: Self-pay | Admitting: Emergency Medicine

## 2018-03-28 ENCOUNTER — Emergency Department
Admission: EM | Admit: 2018-03-28 | Discharge: 2018-03-28 | Disposition: A | Discharge status: Home / Self Care | Payer: Medicare Other | Attending: Student in an Organized Health Care Education/Training Program | Admitting: Student in an Organized Health Care Education/Training Program

## 2018-03-28 ENCOUNTER — Emergency Department: Payer: Medicare Other

## 2018-03-28 ENCOUNTER — Other Ambulatory Visit: Payer: Self-pay

## 2018-03-28 DIAGNOSIS — I10 Essential (primary) hypertension: Secondary | ICD-10-CM | POA: Diagnosis not present

## 2018-03-28 DIAGNOSIS — Z85828 Personal history of other malignant neoplasm of skin: Secondary | ICD-10-CM | POA: Diagnosis not present

## 2018-03-28 DIAGNOSIS — E039 Hypothyroidism, unspecified: Secondary | ICD-10-CM | POA: Insufficient documentation

## 2018-03-28 DIAGNOSIS — R079 Chest pain, unspecified: Secondary | ICD-10-CM | POA: Diagnosis present

## 2018-03-28 DIAGNOSIS — Z79899 Other long term (current) drug therapy: Secondary | ICD-10-CM | POA: Insufficient documentation

## 2018-03-28 DIAGNOSIS — Z87891 Personal history of nicotine dependence: Secondary | ICD-10-CM | POA: Insufficient documentation

## 2018-03-28 LAB — CBC
HCT: 44.4 % (ref 35.0–47.0)
Hemoglobin: 14.9 g/dL (ref 12.0–16.0)
MCH: 31.1 pg (ref 26.0–34.0)
MCHC: 33.4 g/dL (ref 32.0–36.0)
MCV: 93.1 fL (ref 80.0–100.0)
PLATELETS: 182 10*3/uL (ref 150–440)
RBC: 4.77 MIL/uL (ref 3.80–5.20)
RDW: 13.7 % (ref 11.5–14.5)
WBC: 4.4 10*3/uL (ref 3.6–11.0)

## 2018-03-28 LAB — BASIC METABOLIC PANEL
Anion gap: 7 (ref 5–15)
BUN: 8 mg/dL (ref 6–20)
CALCIUM: 9.1 mg/dL (ref 8.9–10.3)
CO2: 27 mmol/L (ref 22–32)
CREATININE: 0.67 mg/dL (ref 0.44–1.00)
Chloride: 105 mmol/L (ref 101–111)
GFR calc non Af Amer: >60 mL/min (ref >60)
GLUCOSE: 104 mg/dL — AB (ref 65–99)
Potassium: 3.4 mmol/L — ABNORMAL LOW (ref 3.5–5.1)
Sodium: 139 mmol/L (ref 135–145)

## 2018-03-28 LAB — TROPONIN I: Troponin I: <0.03 ng/mL (ref <0.03)

## 2018-03-28 MED ORDER — HYDROCODONE-ACETAMINOPHEN 5-325 MG PO TABS: 1.0000 | ORAL_TABLET | Freq: Four times a day (QID) | ORAL | 0 refills | Status: DC | PRN

## 2018-03-28 MED ORDER — FENTANYL CITRATE (PF) 100 MCG/2ML IJ SOLN
INTRAMUSCULAR | Status: AC
  Filled 2018-03-28: qty 2

## 2018-03-28 MED ORDER — HYDROCODONE-ACETAMINOPHEN 5-325 MG PO TABS
1.0000 | ORAL_TABLET | Freq: Once | ORAL | Status: AC
  Administered 2018-03-28: 1 via ORAL
  Filled 2018-03-28: qty 1

## 2018-03-28 MED ORDER — GI COCKTAIL ~~LOC~~: 30.0000 mL | Freq: Once | ORAL | Status: DC

## 2018-03-28 MED ORDER — FENTANYL CITRATE (PF) 100 MCG/2ML IJ SOLN: 100.0000 ug | INTRAMUSCULAR | Status: DC | PRN

## 2018-03-28 MED ORDER — IOHEXOL 350 MG/ML SOLN
75.0000 mL | Freq: Once | INTRAVENOUS | Status: AC | PRN
  Administered 2018-03-28: 75 mL via INTRAVENOUS
  Filled 2018-03-28: qty 75

## 2018-03-28 MED ORDER — GI COCKTAIL ~~LOC~~
ORAL | Status: AC
  Filled 2018-03-28: qty 30

## 2018-03-28 NOTE — ED Notes (Signed)
Patient transported to CT 

## 2018-03-28 NOTE — ED Notes (Signed)
Patient did not want fentanyl or gi cocktail.  Says her pain is settling down now.

## 2018-03-28 NOTE — ED Triage Notes (Signed)
Chest pain since about 9am.  Got worse.

## 2018-03-28 NOTE — ED Provider Notes (Signed)
[facility] Emergency Department Provider Note    First MD Initiated Contact with Patient 03/28/18 [phone]     (approximate)  I have reviewed the triage vital signs and the nursing notes.   HISTORY  Chief Complaint Chest Pain    HPI Savannah [name] is a 72 y.o. female is with chief complaint of stabbing anterior chest wall pain that occurred at 9:00 this morning.  Denies any diaphoresis.  Movement does make it worse.  Does have pain when taking deep inspiration and some shortness of breath associated with this.  Patient with no previous cardiac history.  States that she had stress test on this past December that was negative.  States that she was doing a lot of heavy work around the house as well as heavy lifting over the weekend did not have any chest pains then.  Denies any worsening of pain with ambulation or exertion at this time.  Was primarily concerned she is developed a blood clot because she did have surgery twice over the past 62months.  Has not noticed any swelling in her legs.    Past Medical History:  Diagnosis Date  . Anxiety   . Cancer (HCC)    skin- squamous  . HOH (hard of hearing)    WEARS AIDS  . Hyperlipidemia   . Hypertension    CONTROLLED ON MEDS  . Hypothyroidism   . IBS (irritable bowel syndrome)   . MVP (mitral valve prolapse)   . OA (osteoarthritis) of neck    JOINTS  . Osteoporosis   . Thyroid disease    Family History  Problem Relation Age of Onset  . Cancer Mother        colon  . Hypertension Mother   . Cancer Father        lung  . Hyperlipidemia Brother   . Breast cancer Neg Hx    Past Surgical History:  Procedure Laterality Date  . ANKLE FRACTURE SURGERY Left   . BREAST CYST ASPIRATION Right 20 +yrs  . COLONOSCOPY WITH PROPOFOL N/A 04/12/2016   Procedure: COLONOSCOPY WITH PROPOFOL;  Surgeon: Manya Silvas, MD;  Location[facility];  Service: Endoscopy;  Laterality: N/A;  . FRACTURE SURGERY     ANKLE  .  HAMMER TOE SURGERY Right 02/08/2018   Procedure: HAMMER TOE CORRECTION-5TH TOE, Excision soft tissue mass dorsal lateral right fifth toe and exostectomy dorsal lateral DIPJ fifth toe;  Surgeon: Samara Deist, DPM;  Location: [place];  Service: Podiatry;  Laterality: Right;  IVA LOCAL  . moses surgery  09/2015  . SINUS SURGERY WITH INSTATRAK  10/2017   DEVIATED SEPTUM  . 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 Advance care planning 04/15/2017  . Family history of colon cancer 04/13/2016  . Chronic anxiety 04/13/2016  . Hypothyroidism 09/15/2015  . MVP (mitral valve prolapse) 09/15/2015  . Osteoporosis 09/15/2015  . OA (osteoarthritis) of neck 09/15/2015  . Hyperlipidemia 09/15/2015  . White coat hypertension 09/15/2015  . Migraines 09/15/2015      Prior to Admission medications   Medication Sig Start Date End Date Taking? Authorizing Provider  ALPRAZolam (XANAX) 0.25 MG tablet Take 1 tablet (0.25 mg total) by mouth at bedtime as needed for anxiety. Patient not taking: Reported on 10/07/2017 04/15/17   Kathrine Haddock, NP  atorvastatin (LIPITOR) 20 MG tablet Take 1 tablet (20 mg total) by mouth daily at  6 PM. Patient taking differently: Take 20 mg by mouth daily. AM 04/15/17   Kathrine Haddock, NP  fluticasone (FLONASE) 50 MCG/ACT nasal spray Place 2 sprays into both nostrils daily. Patient taking differently: Place 1 spray into both nostrils daily.  09/13/17   Kathrine Haddock, NP  HYDROcodone-acetaminophen (NORCO) 5-325 MG tablet Take 1 tablet by mouth every 6 (six) hours as needed for moderate pain. 02/08/18   Samara Deist, DPM  HYDROcodone-acetaminophen (NORCO) 5-325 MG tablet Take 1 tablet by mouth every 6 (six) hours as needed for moderate pain. 03/28/18   Merlyn Lot, MD  ibandronate (BONIVA) 150 MG tablet Take one tab. every 30 days,Take in the am with a 8oz glass of water, an empty stomach,nothing by  mouth for 30 min, dont lie down for 30 min 03/01/18   Volney American, PA-C  levothyroxine (SYNTHROID) 75 MCG tablet Take 1 tablet (75 mcg total) by mouth daily before breakfast. 04/15/17   Kathrine Haddock, NP  metoprolol succinate (TOPROL-XL) 50 MG 24 hr tablet Take 1 tablet (50 mg total) by mouth daily. Take 1/2 tablet daily Patient taking differently: Take 25 mg by mouth daily. Take 1/2 tablet daily 04/15/17   Kathrine Haddock, NP  TRANSDERM-SCOP, 1.5 MG, 1 MG/3DAYS Place 1 patch (1.5 mg total) onto the skin every 3 (three) days. Patient taking differently: Place 1 patch onto the skin as needed (FOR DEEP SEA FISHING).  10/18/17   Kathrine Haddock, NP    Allergies Amoxil [amoxicillin] and Penicillins    Social History Social History   Tobacco Use  . Smoking status: Former Smoker    Packs/day: 1.00    Years: 4.00    Pack years: 4.00    Types: Cigarettes  . Smokeless tobacco: Never Used  Substance Use Topics  . Alcohol use: Yes    Alcohol/week: 8.4 oz    Types: 14 Glasses of wine per week  . Drug use: No    Review of Systems Patient denies headaches, rhinorrhea, blurry vision, numbness, shortness of breath, chest pain, edema, cough, abdominal pain, nausea, vomiting, diarrhea, dysuria, fevers, rashes or hallucinations unless otherwise stated above in HPI. ____________________________________________   PHYSICAL EXAM:  VITAL SIGNS: Vitals:   03/28/18 1213 03/28/18 1801  BP: (!) 156/87 (!) 157/86  Pulse: 77 60  Resp: 16 16  Temp: 97.9 F (36.6 C)   SpO2:  97%    Constitutional: Alert and oriented. Well appearing and in no acute distress. Eyes: Conjunctivae are normal.  Head: Atraumatic. Nose: No congestion/rhinnorhea. Mouth/Throat: Mucous membranes are moist.   Neck: No stridor. Painless ROM.  Cardiovascular: Normal rate, regular rhythm. Grossly normal heart sounds.  Good peripheral circulation. Respiratory: Normal respiratory effort.  No retractions. Lungs  CTAB. Gastrointestinal: Soft and nontender. No distention. No abdominal bruits. No CVA tenderness. Genitourinary:  Musculoskeletal: No lower extremity tenderness nor edema.  No joint effusions. Neurologic:  Normal speech and language. No gross focal neurologic deficits are appreciated. No facial droop Skin:  Skin is warm, dry and intact. No rash noted. Psychiatric: Mood and affect are normal. Speech and behavior are normal.  ____________________________________________   LABS (all labs ordered are listed, but only abnormal results are displayed)  Results for orders placed or performed during the hospital encounter of 03/28/18 (from the past 24 hour(s))  Basic metabolic panel     Status: Abnormal   Collection Time: 03/28/18 12:24 PM  Result Value Ref Range   Sodium 139 135 - 145 mmol/L   Potassium 3.4 (L) 3.5 -  5.1 mmol/L   Chloride 105 101 - 111 mmol/L   CO2 27 22 - 32 mmol/L   Glucose, Bld 104 (H) 65 - 99 mg/dL   BUN 8 6 - 20 mg/dL   Creatinine, Ser 0.67 0.44 - 1.00 mg/dL   Calcium 9.1 8.9 - 10.3 mg/dL   GFR calc non Af Amer >60 >60 mL/min   GFR calc Af Amer >60 >60 mL/min   Anion gap 7 5 - 15  CBC     Status: None   Collection Time: 03/28/18 12:24 PM  Result Value Ref Range   WBC 4.4 3.6 - 11.0 K/uL   RBC 4.77 3.80 - 5.20 MIL/uL   Hemoglobin 14.9 12.0 - 16.0 g/dL   HCT 44.4 35.0 - 47.0 %   MCV 93.1 80.0 - 100.0 fL   MCH 31.1 26.0 - 34.0 pg   MCHC 33.4 32.0 - 36.0 g/dL   RDW 13.7 11.5 - 14.5 %   Platelets 182 150 - 440 K/uL  Troponin I     Status: None   Collection Time: 03/28/18 12:24 PM  Result Value Ref Range   Troponin I <0.03 <0.03 ng/mL  Troponin I     Status: None   Collection Time: 03/28/18  4:56 PM  Result Value Ref Range   Troponin I <0.03 <0.03 ng/mL   ____________________________________________  EKG My review and personal interpretation at Time: 12:07   Indication: chest pain  Rate: 85  Rhythm: sinus Axis: normal Other:  Nonspecific st abn, no  stemi, no changes as compared to previous ECG ____________________________________________  RADIOLOGY  I personally reviewed all radiographic images ordered to evaluate for the above acute complaints and reviewed radiology reports and findings.  These findings were personally discussed with the patient.  Please see medical record for radiology report.  ____________________________________________   PROCEDURES  Procedure(s) performed:  Procedures    Critical Care performed: no ____________________________________________   INITIAL IMPRESSION / ASSESSMENT AND PLAN / ED COURSE  Pertinent labs & imaging results that were available during my care of the patient were reviewed by me and considered in my medical decision making (see chart for details).  DDX: ACS, pericarditis, esophagitis, boerhaaves, pe, dissection, pna, bronchitis, costochondritis   NATHAN STALLWORTH is a 72 y.o. who presents to the ED with chest pain as described above.  Seems less consistent with ACS as patient is otherwise low risk except for age with no acute ischemic changes on EKG and initial troponin is negative.  Mildly elevated blood pressure.  Based on pleuritic chest pain and recent surgeries will order CT angiogram to evaluate for evidence of PE.  Will provide p.o. pain medication as I do have a high suspicion for muscular skeletal pain given recent strenuous activity.  Clinical Course as of Mar 28 2109  Tue Mar 28, 2018  1743 CT imaging shows no acute ab normality and repeat troponin is negative.  Seems very unlikely to be ACS but patient still having some pain.  Most likely muscular skeletal strain.  Possible esophagitis.  Will give additional IV pain medication.  Discussed continued observation and reassess need for hospitalization for discharge home.  Patient just recently had a stress test in December which was negative patient otherwise low risk with exception of age.   [PR]  9767 Patient with improvement in  pain.  At this point do feel patient stable and appropriate for DC home.    [PR]    Clinical Course User Index [PR] Merlyn Lot,  MD     As part of my medical decision making, I reviewed the following data within the [place] notes reviewed and incorporated, Labs reviewed, notes from prior ED visits and [place] Controlled Substance Database   ____________________________________________   FINAL CLINICAL IMPRESSION(S) / ED DIAGNOSES  Final diagnoses:  Chest pain, unspecified type      NEW MEDICATIONS STARTED DURING THIS VISIT:  Discharge Medication List as of 03/28/2018  6:16 PM    START taking these medications   Details  !! HYDROcodone-acetaminophen (NORCO) 5-325 MG tablet Take 1 tablet by mouth every 6 (six) hours as needed for moderate pain., Starting Tue 03/28/2018, Print     !! - Potential duplicate medications found. Please discuss with provider.       Note:  This document was prepared using Dragon voice recognition software and may include unintentional dictation errors.    Merlyn Lot, MD 03/28/18 2110

## 2018-04-19 ENCOUNTER — Encounter: Payer: Medicare Other | Admitting: Unknown Physician Specialty

## 2018-04-19 ENCOUNTER — Ambulatory Visit: Payer: Self-pay

## 2018-05-01 ENCOUNTER — Ambulatory Visit (INDEPENDENT_AMBULATORY_CARE_PROVIDER_SITE_OTHER): Payer: Medicare Other | Admitting: Unknown Physician Specialty

## 2018-05-01 ENCOUNTER — Encounter: Payer: Self-pay | Admitting: Unknown Physician Specialty

## 2018-05-01 ENCOUNTER — Ambulatory Visit (INDEPENDENT_AMBULATORY_CARE_PROVIDER_SITE_OTHER): Payer: Medicare Other

## 2018-05-01 ENCOUNTER — Encounter: Payer: Medicare Other | Admitting: Unknown Physician Specialty

## 2018-05-01 VITALS — BP 108/62 | HR 75 | Temp 98.0°F | Resp 17 | Ht 66.0 in | Wt 148.0 lb

## 2018-05-01 VITALS — BP 108/62 | HR 75 | Temp 98.0°F | Ht 66.0 in | Wt 148.0 lb

## 2018-05-01 DIAGNOSIS — M810 Age-related osteoporosis without current pathological fracture: Secondary | ICD-10-CM | POA: Diagnosis not present

## 2018-05-01 DIAGNOSIS — R030 Elevated blood-pressure reading, without diagnosis of hypertension: Secondary | ICD-10-CM

## 2018-05-01 DIAGNOSIS — Z0000 Encounter for general adult medical examination without abnormal findings: Secondary | ICD-10-CM | POA: Diagnosis not present

## 2018-05-01 DIAGNOSIS — E7800 Pure hypercholesterolemia, unspecified: Secondary | ICD-10-CM | POA: Diagnosis not present

## 2018-05-01 DIAGNOSIS — S91312A Laceration without foreign body, left foot, initial encounter: Secondary | ICD-10-CM | POA: Diagnosis not present

## 2018-05-01 DIAGNOSIS — Z23 Encounter for immunization: Secondary | ICD-10-CM

## 2018-05-01 DIAGNOSIS — M25561 Pain in right knee: Secondary | ICD-10-CM | POA: Diagnosis not present

## 2018-05-01 DIAGNOSIS — E039 Hypothyroidism, unspecified: Secondary | ICD-10-CM | POA: Diagnosis not present

## 2018-05-01 MED ORDER — LEVOTHYROXINE SODIUM 75 MCG PO TABS: 75.0000 ug | ORAL_TABLET | Freq: Every day | ORAL | 3 refills | Status: DC

## 2018-05-01 MED ORDER — ATORVASTATIN CALCIUM 20 MG PO TABS: 20.0000 mg | ORAL_TABLET | Freq: Every day | ORAL | 3 refills | Status: DC

## 2018-05-01 MED ORDER — SCOPOLAMINE 1 MG/3DAYS TD PT72
1.0000 | MEDICATED_PATCH | TRANSDERMAL | 0 refills | Status: DC
Start: 2018-05-01 — End: 2019-05-03

## 2018-05-01 MED ORDER — METOPROLOL SUCCINATE ER 50 MG PO TB24: 25.0000 mg | ORAL_TABLET | Freq: Every day | ORAL | 3 refills | Status: DC

## 2018-05-01 MED ORDER — ALPRAZOLAM 0.25 MG PO TABS: 0.2500 mg | ORAL_TABLET | Freq: Every evening | ORAL | 0 refills | Status: DC | PRN

## 2018-05-01 NOTE — Assessment & Plan Note (Signed)
Check CMP.  Controlled today

## 2018-05-01 NOTE — Progress Notes (Signed)
Subjective:   Savannah [name] is a 72 y.o. female who presents for Medicare Annual (Subsequent) preventive examination.  Review of Systems:   Cardiac Risk Factors include: advanced age (>5men, >79 women);hypertension;dyslipidemia     Objective:     Vitals: BP 108/62 (BP Location: Left Arm, Patient Position: Sitting)   Pulse 75   Temp 98 F (36.7 C) (Temporal)   Resp 17   Ht 5\' 6"  (1.676 m)   Wt 148 lb (67.1 kg)   LMP  (LMP Unknown)   SpO2 98%   BMI 23.89 kg/m   Body mass index is 23.89 kg/m.  Advanced Directives 05/01/2018 02/08/2018 04/17/2017 04/15/2017 04/13/2016 02/01/2016 09/16/2015  Does Patient Have a Medical Advance Directive? Yes Yes Yes Yes Yes Yes Yes  Type of Paramedic of [place];Living will [place];Living will [place];Living will [place];Living will [place];Living will - [place];Living will  Does patient want to make changes to medical advance directive? - - No - Patient declined - - - No - Patient declined  Copy of [place] in Chart? No - copy requested Yes No - copy requested - - - No - copy requested  Would patient like information on creating a medical advance directive? - - - - - - No - patient declined information    Tobacco Social History   Tobacco Use  Smoking Status Former Smoker  . Packs/day: 1.00  . Years: 4.00  . Pack years: 4.00  . Types: Cigarettes  . Last attempt to quit: 1970  . Years since quitting: 49.3  Smokeless Tobacco Never Used     Counseling given: Not Answered   Clinical Intake:  Pre-visit preparation completed: Yes  Pain : 0-10 Pain Score: 7  Pain Type: Acute pain Pain Location: Knee Pain Orientation: Right Pain Descriptors / Indicators: Aching Pain Onset: In the past 7 days Pain Frequency: Intermittent     Nutritional Status: BMI of 19-24  Normal Nutritional Risks:  None Diabetes: No  How often do you need to have someone help you when you read instructions, pamphlets, or other written materials from your doctor or pharmacy?: 1 - Never What is the last grade level you completed in school?: masters degree  Interpreter Needed?: No  Information entered by :: Tiffany Hill,LPN  Past Medical History:  Diagnosis Date  . Anxiety   . Cancer (HCC)    skin- squamous  . HOH (hard of hearing)    WEARS AIDS  . Hyperlipidemia   . Hypertension    CONTROLLED ON MEDS  . Hypothyroidism   . IBS (irritable bowel syndrome)   . MVP (mitral valve prolapse)   . OA (osteoarthritis) of neck    JOINTS  . Osteoporosis   . Thyroid disease    Past Surgical History:  Procedure Laterality Date  . ANKLE FRACTURE SURGERY Left   . BREAST CYST ASPIRATION Right 20 +yrs  . COLONOSCOPY WITH PROPOFOL N/A 04/12/2016   Procedure: COLONOSCOPY WITH PROPOFOL;  Surgeon: Manya Silvas, MD;  Location: [facility] ENDOSCOPY;  Service: Endoscopy;  Laterality: N/A;  . FRACTURE SURGERY     ANKLE  . HAMMER TOE SURGERY Right 02/08/2018   Procedure: HAMMER TOE CORRECTION-5TH TOE, Excision soft tissue mass dorsal lateral right fifth toe and exostectomy dorsal lateral DIPJ fifth toe;  Surgeon: Samara Deist, DPM;  Location: [place];  Service: Podiatry;  Laterality: Right;  IVA LOCAL  .  moses surgery  09/2015  . SINUS SURGERY WITH INSTATRAK  10/2017   DEVIATED SEPTUM  . 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 Cancer Mother        colon  . Hypertension Mother   . Cancer Father        lung  . Hyperlipidemia Brother   . Breast cancer Neg Hx    Social History   Socioeconomic History  . Marital status: Married    Spouse name: Not on file  . Number of children: Not on file  . Years of education: Not on file  . Highest education level: Not on file  Occupational History  . Not on file  Social Needs  . Financial  resource strain: Not hard at all  . Food insecurity:    Worry: Never true    Inability: Never true  . Transportation needs:    Medical: No    Non-medical: No  Tobacco Use  . Smoking status: Former Smoker    Packs/day: 1.00    Years: 4.00    Pack years: 4.00    Types: Cigarettes    Last attempt to quit: 1970    Years since quitting: 49.3  . Smokeless tobacco: Never Used  Substance and Sexual Activity  . Alcohol use: Yes    Alcohol/week: 8.4 oz    Types: 14 Glasses of wine per week  . Drug use: No  . Sexual activity: Yes  Lifestyle  . Physical activity:    Days per week: 7 days    Minutes per session: 40 min  . Stress: Not at all  Relationships  . Social connections:    Talks on phone: More than three times a week    Gets together: More than three times a week    Attends religious service: Never    Active member of club or organization: No    Attends meetings of clubs or organizations: Never    Relationship status: Married  Other Topics Concern  . Not on file  Social History Narrative  . Not on file    Outpatient Encounter Medications as of 05/01/2018  Medication Sig  . atorvastatin (LIPITOR) 20 MG tablet Take 1 tablet (20 mg total) by mouth daily at 6 PM. (Patient taking differently: Take 20 mg by mouth daily. AM)  . fluticasone (FLONASE) 50 MCG/ACT nasal spray Place 2 sprays into both nostrils daily. (Patient taking differently: Place 1 spray into both nostrils daily. )  . ibandronate (BONIVA) 150 MG tablet Take one tab. every 30 days,Take in the am with a 8oz glass of water, an empty stomach,nothing by mouth for 30 min, dont lie down for 30 min  . levothyroxine (SYNTHROID) 75 MCG tablet Take 1 tablet (75 mcg total) by mouth daily before breakfast.  . metoprolol succinate (TOPROL-XL) 50 MG 24 hr tablet Take 1 tablet (50 mg total) by mouth daily. Take 1/2 tablet daily (Patient taking differently: Take 25 mg by mouth daily. Take 1/2 tablet daily)  . TRANSDERM-SCOP, 1.5  MG, 1 MG/3DAYS Place 1 patch (1.5 mg total) onto the skin every 3 (three) days. (Patient taking differently: Place 1 patch onto the skin as needed (FOR DEEP SEA FISHING). )  . ALPRAZolam (XANAX) 0.25 MG tablet Take 1 tablet (0.25 mg total) by mouth at bedtime as needed for anxiety.  . [DISCONTINUED] HYDROcodone-acetaminophen (NORCO) 5-325 MG tablet Take 1 tablet by mouth every 6 (six)  hours as needed for moderate pain. (Patient not taking: Reported on 05/01/2018)  . [DISCONTINUED] HYDROcodone-acetaminophen (NORCO) 5-325 MG tablet Take 1 tablet by mouth every 6 (six) hours as needed for moderate pain. (Patient not taking: Reported on 05/01/2018)   No facility-administered encounter medications on file as of 05/01/2018.     Activities of Daily Living In your present state of health, do you have any difficulty performing the following activities: 05/01/2018 02/08/2018  Hearing? N N  Comment has bilateral hearing aids  -  Vision? N N  Difficulty concentrating or making decisions? N N  Walking or climbing stairs? N N  Dressing or bathing? N N  Doing errands, shopping? N -  Preparing Food and eating ? N -  Using the Toilet? N -  In the past six months, have you accidently leaked urine? N -  Do you have problems with loss of bowel control? N -  Managing your Medications? N -  Managing your Finances? N -  Housekeeping or managing your Housekeeping? N -  Some recent data might be hidden    Patient Care Team: Kathrine Haddock, NP as PCP - General (Nurse Practitioner) Beverly Gust, MD (Unknown Physician Specialty) Manya Silvas, MD (Gastroenterology) Corey Skains, MD as Consulting Physician (Cardiology) Oneta Rack, MD (Dermatology) Vladimir Crofts, MD (Neurology) Will Bonnet, MD as Attending Physician (Obstetrics and Gynecology) Anell Barr, OD (Optometry)    Assessment:   This is a routine wellness examination for [facility].  Exercise Activities and Dietary  recommendations Current Exercise Habits: Home exercise routine, Type of exercise: walking;treadmill;strength training/weights, Time (Minutes): > 60, Frequency (Times/Week): 7, Weekly Exercise (Minutes/Week): 0, Intensity: Moderate, Exercise limited by: None identified  Goals    . DIET - INCREASE WATER INTAKE     Recommend drinking at least 6-8 glasses of water a day        Fall Risk Fall Risk  05/01/2018 04/15/2017 04/13/2016 09/16/2015  Falls in the past year? No No No No   Is the patient's home free of loose throw rugs in walkways, pet beds, electrical cords, etc?   yes      Grab bars in the bathroom? no      Handrails on the stairs?   yes      Adequate lighting?   yes  Timed Get Up and Go performed: Completed in 8 seconds with no use of assistive devices, steady gait. No intervention needed at this time.   Depression Screen PHQ 2/9 Scores 05/01/2018 04/15/2017 04/13/2016 09/16/2015  PHQ - 2 Score 0 0 0 0  PHQ- 9 Score - 0 - -     Cognitive Function     6CIT Screen 05/01/2018  What Year? 0 points  What month? 0 points  What time? 0 points  Count back from 20 0 points  Months in reverse 0 points  Repeat phrase 0 points  Total Score 0    Immunization History  Administered Date(s) Administered  . Influenza-Unspecified 10/20/2015, 11/03/2017  . Pneumococcal Conjugate-13 09/16/2015  . Pneumococcal Polysaccharide-23 07/03/2013  . Td 01/31/2007  . Tdap 05/01/2018  . Zoster 02/18/2009    Qualifies for Shingles Vaccine? Yes, discussed shingrix vaccine   Screening Tests Health Maintenance  Topic Date Due  . INFLUENZA VACCINE  07/20/2018  . MAMMOGRAM  07/12/2019  . COLONOSCOPY  04/12/2026  . TETANUS/TDAP  05/01/2028  . DEXA SCAN  Completed  . Hepatitis C Screening  Completed  . PNA vac Low Risk Adult  Completed    Cancer Screenings: Lung: Low Dose CT Chest recommended if Age 14-80 years, 30 pack-year currently smoking OR have quit w/in 15years. Patient does not  qualify. Breast:  Up to date on Mammogram? Yes  07/11/2017 Up to date of Bone Density/Dexa? Yes 11/01/2017 Colorectal: completed 04/12/2016  Additional Screenings:  Hepatitis C Screening: completed 09/16/2015     Plan:    I have personally reviewed and addressed the Medicare Annual Wellness questionnaire and have noted the following in the patient's chart:  A. Medical and social history B. Use of alcohol, tobacco or illicit drugs  C. Current medications and supplements D. Functional ability and status E.  Nutritional status F.  Physical activity G. Advance directives H. List of other physicians I.  Hospitalizations, surgeries, and ER visits in previous 12 months J.  [place] such as hearing and vision if needed, cognitive and depression L. Referrals and appointments   In addition, I have reviewed and discussed with patient certain preventive protocols, quality metrics, and best practice recommendations. A written personalized care plan for preventive services as well as general preventive health recommendations were provided to patient.   Signed,  Tyler Aas, LPN Nurse Health Advisor   Nurse Notes:none

## 2018-05-01 NOTE — Progress Notes (Signed)
BP 108/62   Pulse 75   Temp 98 F (36.7 C) (Temporal)   Ht 5\' 6"  (1.676 m)   Wt 148 lb (67.1 kg)   LMP  (LMP Unknown)   SpO2 98%   BMI 23.89 kg/m    Subjective:    Patient ID: NIANA MARTORANA, female    DOB: 07/07/1946, 72 y.o.   MRN: [id number]  HPI: FELICE HOPE is a 72 y.o. female  Chief Complaint  Patient presents with  . Annual Exam    pt had wellness visit with [facility] before this visit  . Knee Pain    7/10 pain, injured a few days ago   Hypothyroid No change in weight or energy  Hypertension White coat mostly Average home BP Typically below 120/80  No problems or lightheadedness No chest pain with exertion or shortness of breath No Edema  Hyperlipidemia Using medications without problems: No Muscle aches  Diet compliance:Exercise:   Social History   Socioeconomic History  . Marital status: Married    Spouse name: Not on file  . Number of children: Not on file  . Years of education: Not on file  . Highest education level: Not on file  Occupational History  . Not on file  Social Needs  . Financial resource strain: Not hard at all  . Food insecurity:    Worry: Never true    Inability: Never true  . Transportation needs:    Medical: No    Non-medical: No  Tobacco Use  . Smoking status: Former Smoker    Packs/day: 1.00    Years: 4.00    Pack years: 4.00    Types: Cigarettes    Last attempt to quit: 1970    Years since quitting: 49.3  . Smokeless tobacco: Never Used  Substance and Sexual Activity  . Alcohol use: Yes    Alcohol/week: 8.4 oz    Types: 14 Glasses of wine per week  . Drug use: No  . Sexual activity: Yes  Lifestyle  . Physical activity:    Days per week: 7 days    Minutes per session: 40 min  . Stress: Not at all  Relationships  . Social connections:    Talks on phone: More than three times a week    Gets together: More than three times a week    Attends religious service: Never    Active member of club or organization: No   Attends meetings of clubs or organizations: Never    Relationship status: Married  . Intimate partner violence:    Fear of current or ex partner: No    Emotionally abused: No    Physically abused: No    Forced sexual activity: No  Other Topics Concern  . Not on file  Social History Narrative  . Not on file   Family History  Problem Relation Age of Onset  . Cancer Mother        colon  . Hypertension Mother   . Cancer Father        lung  . Hyperlipidemia Brother   . Breast cancer Neg Hx    Past Medical History:  Diagnosis Date  . Anxiety   . Cancer (HCC)    skin- squamous  . HOH (hard of hearing)    WEARS AIDS  . Hyperlipidemia   . Hypertension    CONTROLLED ON MEDS  . Hypothyroidism   . IBS (irritable bowel syndrome)   . MVP (mitral valve prolapse)   .  OA (osteoarthritis) of neck    JOINTS  . Osteoporosis   . Thyroid disease    Past Surgical History:  Procedure Laterality Date  . ANKLE FRACTURE SURGERY Left   . BREAST CYST ASPIRATION Right 20 +yrs  . COLONOSCOPY WITH PROPOFOL N/A 04/12/2016   Procedure: COLONOSCOPY WITH PROPOFOL;  Surgeon: Manya Silvas, MD;  Location[facility] ENDOSCOPY;  Service: Endoscopy;  Laterality: N/A;  . FRACTURE SURGERY     ANKLE  . HAMMER TOE SURGERY Right 02/08/2018   Procedure: HAMMER TOE CORRECTION-5TH TOE, Excision soft tissue mass dorsal lateral right fifth toe and exostectomy dorsal lateral DIPJ fifth toe;  Surgeon: Samara Deist, DPM;  Location: [place];  Service: Podiatry;  Laterality: Right;  IVA LOCAL  . moses surgery  09/2015  . SINUS SURGERY WITH INSTATRAK  10/2017   DEVIATED SEPTUM  . 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 Interim medical history since our last visit reviewed. Allergies and medications reviewed and updated.  Review of Systems  Constitutional: Negative.   HENT: Negative.   Eyes:  Negative.   Respiratory: Negative.   Cardiovascular: Negative.   Gastrointestinal: Negative.        Needs patches for yearly deep sea fishing  Endocrine: Negative.   Skin:       Skin lesion left forearm.    Psychiatric/Behavioral:       Gets on rx of #30 Xanax that lasts 1 year.      Per HPI unless specifically indicated above     Objective:    BP 108/62   Pulse 75   Temp 98 F (36.7 C) (Temporal)   Ht 5\' 6"  (1.676 m)   Wt 148 lb (67.1 kg)   LMP  (LMP Unknown)   SpO2 98%   BMI 23.89 kg/m   Wt Readings from Last 3 Encounters:  05/01/18 148 lb (67.1 kg)  05/01/18 148 lb (67.1 kg)  03/28/18 145 lb (65.8 kg)    Physical Exam  Constitutional: She is oriented to person, place, and time. She appears well-developed and well-nourished. No distress.  HENT:  Head: Normocephalic and atraumatic.  Eyes: Conjunctivae and lids are normal. Right eye exhibits no discharge. Left eye exhibits no discharge. No scleral icterus.  Neck: Normal range of motion. Neck supple. No JVD present. Carotid bruit is not present.  Cardiovascular: Normal rate, regular rhythm and normal heart sounds.  Pulmonary/Chest: Effort normal and breath sounds normal.  Abdominal: Normal appearance. There is no splenomegaly or hepatomegaly.  Musculoskeletal: Normal range of motion.  Neurological: She is alert and oriented to person, place, and time.  Skin: Skin is warm, dry and intact. No rash noted. No pallor.  Psychiatric: She has a normal mood and affect. Her behavior is normal. Judgment and thought content normal.    Results for orders placed or performed during the hospital encounter of 72/53/66  Basic metabolic panel  Result Value Ref Range   Sodium 139 135 - 145 mmol/L   Potassium 3.4 (L) 3.5 - 5.1 mmol/L   Chloride 105 101 - 111 mmol/L   CO2 27 22 - 32 mmol/L   Glucose, Bld 104 (H) 65 - 99 mg/dL   BUN 8 6 - 20 mg/dL   Creatinine, Ser 0.67 0.44 - 1.00 mg/dL   Calcium 9.1 8.9 - 10.3 mg/dL   GFR calc  non Af Amer >60 >60 mL/min  GFR calc Af Amer >60 >60 mL/min   Anion gap 7 5 - 15  CBC  Result Value Ref Range   WBC 4.4 3.6 - 11.0 K/uL   RBC 4.77 3.80 - 5.20 MIL/uL   Hemoglobin 14.9 12.0 - 16.0 g/dL   HCT 44.4 35.0 - 47.0 %   MCV 93.1 80.0 - 100.0 fL   MCH 31.1 26.0 - 34.0 pg   MCHC 33.4 32.0 - 36.0 g/dL   RDW 13.7 11.5 - 14.5 %   Platelets 182 150 - 440 K/uL  Troponin I  Result Value Ref Range   Troponin I <0.03 <0.03 ng/mL  Troponin I  Result Value Ref Range   Troponin I <0.03 <0.03 ng/mL      Assessment & Plan:   Problem List Items Addressed This Visit      Unprioritized   Hyperlipidemia    Check lipid panel today      Relevant Medications   metoprolol succinate (TOPROL-XL) 50 MG 24 hr tablet   atorvastatin (LIPITOR) 20 MG tablet   Other Relevant Orders   Lipid Panel w/o Chol/HDL Ratio   Hypothyroidism    Check thyroid panel      Relevant Medications   levothyroxine (SYNTHROID) 75 MCG tablet   metoprolol succinate (TOPROL-XL) 50 MG 24 hr tablet   Other Relevant Orders   TSH   Osteoporosis    Just started Boniva.  Continue present and recheck bone density next year      Right knee pain    Tylenol is working.  Encouraged to wait 6 weeks until further evaluation       Other Visit Diagnoses    Annual physical exam    -  Primary   White coat syndrome with high blood pressure but without hypertension       Relevant Medications   metoprolol succinate (TOPROL-XL) 50 MG 24 hr tablet   atorvastatin (LIPITOR) 20 MG tablet   Other Relevant Orders   Comprehensive metabolic panel       Follow up plan: Return in about 6 months (around 11/01/2018).

## 2018-05-01 NOTE — Assessment & Plan Note (Signed)
Check lipid panel today 

## 2018-05-01 NOTE — Assessment & Plan Note (Signed)
Just started Boniva.  Continue present and recheck bone density next year

## 2018-05-01 NOTE — Patient Instructions (Addendum)
Savannah [name] , Thank you for taking time to come for your Medicare Wellness Visit. I appreciate your ongoing commitment to your health goals. Please review the following plan we discussed and let me know if I can assist you in the future.   Screening recommendations/referrals: Colonoscopy: completed 04/12/2016 Mammogram: completed 07/11/2017 Bone Density: completed 11/01/2017 Recommended yearly ophthalmology/optometry visit for glaucoma screening and checkup Recommended yearly dental visit for hygiene and checkup  Vaccinations: Influenza vaccine: up to date, due 08/2018 Pneumococcal vaccine: completed series Tdap vaccine: up to date  Shingles vaccine: shingrix eligible, check with your insurance company for coverage     Advanced directives: Please bring a copy of your health care power of attorney and living will to the office at your convenience.  Conditions/risks identified: Recommend drinking at least 6-8 glasses of water a day   Next appointment: Follow up in one year for your annual wellness exam.   Preventive Care 65 Years and Older, Female Preventive care refers to lifestyle choices and visits with your health care provider that can promote health and wellness. What does preventive care include?  A yearly physical exam. This is also called an annual well check.  Dental exams once or twice a year.  Routine eye exams. Ask your health care provider how often you should have your eyes checked.  Personal lifestyle choices, including:  Daily care of your teeth and gums.  Regular physical activity.  Eating a healthy diet.  Avoiding tobacco and drug use.  Limiting alcohol use.  Practicing safe sex.  Taking low-dose aspirin every day.  Taking vitamin and mineral supplements as recommended by your health care provider. What happens during an annual well check? The services and screenings done by your health care provider during your annual well check will depend on your age,  overall health, lifestyle risk factors, and family history of disease. Counseling  Your health care provider may ask you questions about your:  Alcohol use.  Tobacco use.  Drug use.  Emotional well-being.  Home and relationship well-being.  Sexual activity.  Eating habits.  History of falls.  Memory and ability to understand (cognition).  Work and work Statistician.  Reproductive health. Screening  You may have the following tests or measurements:  Height, weight, and BMI.  Blood pressure.  Lipid and cholesterol levels. These may be checked every 5 years, or more frequently if you are over 41 years old.  Skin check.  Lung cancer screening. You may have this screening every year starting at age 18 if you have a 30-pack-year history of smoking and currently smoke or have quit within the past 15 years.  Fecal occult blood test (FOBT) of the stool. You may have this test every year starting at age 20.  Flexible sigmoidoscopy or colonoscopy. You may have a sigmoidoscopy every 5 years or a colonoscopy every 10 years starting at age 52.  Hepatitis C blood test.  Hepatitis B blood test.  Sexually transmitted disease (STD) testing.  Diabetes screening. This is done by checking your blood sugar (glucose) after you have not eaten for a while (fasting). You may have this done every 1-3 years.  Bone density scan. This is done to screen for osteoporosis. You may have this done starting at age 33.  Mammogram. This may be done every 1-2 years. Talk to your health care provider about how often you should have regular mammograms. Talk with your health care provider about your test results, treatment options, and if necessary, the need  for more tests. Vaccines  Your health care provider may recommend certain vaccines, such as:  Influenza vaccine. This is recommended every year.  Tetanus, diphtheria, and acellular pertussis (Tdap, Td) vaccine. You may need a Td booster every 10  years.  Zoster vaccine. You may need this after age 31.  Pneumococcal 13-valent conjugate (PCV13) vaccine. One dose is recommended after age 84.  Pneumococcal polysaccharide (PPSV23) vaccine. One dose is recommended after age 53. Talk to your health care provider about which screenings and vaccines you need and how often you need them. This information is not intended to replace advice given to you by your health care provider. Make sure you discuss any questions you have with your health care provider. Document Released: 01/02/2016 Document Revised: 08/25/2016 Document Reviewed: 10/07/2015 Elsevier Interactive Patient Education  2017 [place] Prevention in the Home Falls can cause injuries. They can happen to people of all ages. There are many things you can do to make your home safe and to help prevent falls. What can I do on the outside of my home?  Regularly fix the edges of walkways and driveways and fix any cracks.  Remove anything that might make you trip as you walk through a door, such as a raised step or threshold.  Trim any bushes or trees on the path to your home.  Use bright outdoor lighting.  Clear any walking paths of anything that might make someone trip, such as rocks or tools.  Regularly check to see if handrails are loose or broken. Make sure that both sides of any steps have handrails.  Any raised decks and porches should have guardrails on the edges.  Have any leaves, snow, or ice cleared regularly.  Use sand or salt on walking paths during winter.  Clean up any spills in your garage right away. This includes oil or grease spills. What can I do in the bathroom?  Use night lights.  Install grab bars by the toilet and in the tub and shower. Do not use towel bars as grab bars.  Use non-skid mats or decals in the tub or shower.  If you need to sit down in the shower, use a plastic, non-slip stool.  Keep the floor dry. Clean up any water that  spills on the floor as soon as it happens.  Remove soap buildup in the tub or shower regularly.  Attach bath mats securely with double-sided non-slip rug tape.  Do not have throw rugs and other things on the floor that can make you trip. What can I do in the bedroom?  Use night lights.  Make sure that you have a light by your bed that is easy to reach.  Do not use any sheets or blankets that are too big for your bed. They should not hang down onto the floor.  Have a firm chair that has side arms. You can use this for support while you get dressed.  Do not have throw rugs and other things on the floor that can make you trip. What can I do in the kitchen?  Clean up any spills right away.  Avoid walking on wet floors.  Keep items that you use a lot in easy-to-reach places.  If you need to reach something above you, use a strong step stool that has a grab bar.  Keep electrical cords out of the way.  Do not use floor polish or wax that makes floors slippery. If you must use wax, use  non-skid floor wax.  Do not have throw rugs and other things on the floor that can make you trip. What can I do with my stairs?  Do not leave any items on the stairs.  Make sure that there are handrails on both sides of the stairs and use them. Fix handrails that are broken or loose. Make sure that handrails are as long as the stairways.  Check any carpeting to make sure that it is firmly attached to the stairs. Fix any carpet that is loose or worn.  Avoid having throw rugs at the top or bottom of the stairs. If you do have throw rugs, attach them to the floor with carpet tape.  Make sure that you have a light switch at the top of the stairs and the bottom of the stairs. If you do not have them, ask someone to add them for you. What else can I do to help prevent falls?  Wear shoes that:  Do not have high heels.  Have rubber bottoms.  Are comfortable and fit you well.  Are closed at the  toe. Do not wear sandals.  If you use a stepladder:  Make sure that it is fully opened. Do not climb a closed stepladder.  Make sure that both sides of the stepladder are locked into place.  Ask someone to hold it for you, if possible.  Clearly mark and make sure that you can see:  Any grab bars or handrails.  First and last steps.  Where the edge of each step is.  Use tools that help you move around (mobility aids) if they are needed. These include:  Canes.  Walkers.  Scooters.  Crutches.  Turn on the lights when you go into a dark area. Replace any light bulbs as soon as they burn out.  Set up your furniture so you have a clear path. Avoid moving your furniture around.  If any of your floors are uneven, fix them.  If there are any pets around you, be aware of where they are.  Review your medicines with your doctor. Some medicines can make you feel dizzy. This can increase your chance of falling. Ask your doctor what other things that you can do to help prevent falls. This information is not intended to replace advice given to you by your health care provider. Make sure you discuss any questions you have with your health care provider. Document Released: 10/02/2009 Document Revised: 05/13/2016 Document Reviewed: 01/10/2015 Elsevier Interactive Patient Education  2017 [employer]  DTaP Vaccine (Diphtheria, Tetanus, and Pertussis): What You Need to Know 1. Why get vaccinated? Diphtheria, tetanus, and pertussis are serious diseases caused by bacteria. Diphtheria and pertussis are spread from person to person. Tetanus enters the body through cuts or wounds. DIPHTHERIA causes a thick covering in the back of the throat.  It can lead to breathing problems, paralysis, heart failure, and even death.  TETANUS (Lockjaw) causes painful tightening of the muscles, usually all over the body.  It can lead to "locking" of the jaw so the victim cannot open his mouth or swallow.  Tetanus leads to death in up to 2 out of 10 cases.  PERTUSSIS (Whooping Cough) causes coughing spells so bad that it is hard for infants to eat, drink, or breathe. These spells can last for weeks.  It can lead to pneumonia, seizures (jerking and staring spells), brain damage, and death.  Diphtheria, tetanus, and pertussis vaccine (DTaP) can help prevent these diseases. Most children  who are vaccinated with DTaP will be protected throughout childhood. Many more children would get these diseases if we stopped vaccinating. DTaP is a safer version of an older vaccine called DTP. DTP is no longer used in the [place]. 2. Who should get DTaP vaccine and when? Children should get 5 doses of DTaP vaccine, one dose at each of the following ages:  2 months  4 months  6 months  15-18 months  4-6 years  DTaP may be given at the same time as other vaccines. 3. Some children should not get DTaP vaccine or should wait  Children with minor illnesses, such as a cold, may be vaccinated. But children who are moderately or severely ill should usually wait until they recover before getting DTaP vaccine.  Any child who had a life-threatening allergic reaction after a dose of DTaP should not get another dose.  Any child who suffered a brain or nervous system disease within 7 days after a dose of DTaP should not get another dose.  Talk with your doctor if your child: ? had a seizure or collapsed after a dose of DTaP, ? cried non-stop for 3 hours or more after a dose of DTaP, ? had a fever over 105F after a dose of DTaP. Ask your doctor for more information. Some of these children should not get another dose of pertussis vaccine, but may get a vaccine without pertussis, called DT. 4. Older children and adults DTaP is not licensed for adolescents, adults, or children 13 years of age and older. But older people still need protection. A vaccine called Tdap is similar to DTaP. A single dose of Tdap is  recommended for people 11 through 73 years of age. Another vaccine, called Td, protects against tetanus and diphtheria, but not pertussis. It is recommended every 10 years. There are separate Vaccine Information Statements for these vaccines. 5. What are the risks from DTaP vaccine? Getting diphtheria, tetanus, or pertussis disease is much riskier than getting DTaP vaccine. However, a vaccine, like any medicine, is capable of causing serious problems, such as severe allergic reactions. The risk of DTaP vaccine causing serious harm, or death, is extremely small. Mild problems (common)  Fever (up to about 1 child in 4)  Redness or swelling where the shot was given (up to about 1 child in 4)  Soreness or tenderness where the shot was given (up to about 1 child in 4) These problems occur more often after the 4th and 5th doses of the DTaP series than after earlier doses. Sometimes the 4th or 5th dose of DTaP vaccine is followed by swelling of the entire arm or leg in which the shot was given, lasting 1-7 days (up to about 1 child in 16). Other mild problems include:  Fussiness (up to about 1 child in 3)  Tiredness or poor appetite (up to about 1 child in 10)  Vomiting (up to about 1 child in 80) These problems generally occur 1-3 days after the shot. Moderate problems (uncommon)  Seizure (jerking or staring) (about 1 child out of 14,000)  Non-stop crying, for 3 hours or more (up to about 1 child out of 1,000)  High fever, over 105F (about 1 child out of 16,000) Severe problems (very rare)  Serious allergic reaction (less than 1 out of a million doses)  Several other severe problems have been reported after DTaP vaccine. These include: ? Long-term seizures, coma, or lowered consciousness ? Permanent brain damage. These are so rare  it is hard to tell if they are caused by the vaccine. Controlling fever is especially important for children who have had seizures, for any reason. It is also  important if another family member has had seizures. You can reduce fever and pain by giving your child an aspirin-free pain reliever when the shot is given, and for the next 24 hours, following the package instructions. 6. What if there is a serious reaction? What should I look for? Look for anything that concerns you, such as signs of a severe allergic reaction, very high fever, or behavior changes. Signs of a severe allergic reaction can include hives, swelling of the face and throat, difficulty breathing, a fast heartbeat, dizziness, and weakness. These would start a few minutes to a few hours after the vaccination. What should I do?  If you think it is a severe allergic reaction or other emergency that can't wait, call 9-1-1 or get the person to the nearest hospital. Otherwise, call your doctor.  Afterward, the reaction should be reported to the Vaccine Adverse Event Reporting System (VAERS). Your doctor might file this report, or you can do it yourself through the VAERS web site at www.vaers.SamedayNews.es, or by calling (484)768-4200. ? VAERS is only for reporting reactions. They do not give medical advice. 7. The National Vaccine Injury Compensation Program The [employer] Vaccine Injury Compensation Program (VICP) is a federal program that was created to compensate people who may have been injured by certain vaccines. Persons who believe they may have been injured by a vaccine can learn about the program and about filing a claim by calling 267-043-2615 or visiting the [employer] website at GoldCloset.com.ee. 8. How can I learn more?  Ask your doctor.  Call your local or state health department.  Contact the Centers for Disease Control and Prevention (CDC): ? Call 478 487 4313 (1-800-CDC-INFO) or ? Visit CDC's website at http://hunter.com/ CDC DTaP Vaccine (Diphtheria, Tetanus, and Pertussis) VIS (05/05/06) This information is not intended to replace advice given to you by your  health care provider. Make sure you discuss any questions you have with your health care provider. Document Released: 10/03/2006 Document Revised: 08/26/2016 Document Reviewed: 08/26/2016 Elsevier Interactive Patient Education  2017 [employer].

## 2018-05-01 NOTE — Assessment & Plan Note (Signed)
Tylenol is working.  Encouraged to wait 6 weeks until further evaluation

## 2018-05-01 NOTE — Assessment & Plan Note (Signed)
Check thyroid panel 

## 2018-05-02 LAB — TSH: TSH: 1.34 u[IU]/mL (ref 0.450–4.500)

## 2018-05-02 LAB — COMPREHENSIVE METABOLIC PANEL
ALBUMIN: 4.6 g/dL (ref 3.5–4.8)
ALK PHOS: 66 IU/L (ref 39–117)
ALT: 11 IU/L (ref 0–32)
AST: 19 IU/L (ref 0–40)
Albumin/Globulin Ratio: 2.3 — ABNORMAL HIGH (ref 1.2–2.2)
BILIRUBIN TOTAL: 0.3 mg/dL (ref 0.0–1.2)
BUN/Creatinine Ratio: 14 (ref 12–28)
BUN: 11 mg/dL (ref 8–27)
CHLORIDE: 103 mmol/L (ref 96–106)
CO2: 26 mmol/L (ref 20–29)
Calcium: 9.5 mg/dL (ref 8.7–10.3)
Creatinine, Ser: 0.81 mg/dL (ref 0.57–1.00)
GFR calc Af Amer: 83 mL/min/{1.73_m2} (ref >59)
GFR calc non Af Amer: 72 mL/min/{1.73_m2} (ref >59)
GLOBULIN, TOTAL: 2 g/dL (ref 1.5–4.5)
Glucose: 79 mg/dL (ref 65–99)
POTASSIUM: 4 mmol/L (ref 3.5–5.2)
SODIUM: 144 mmol/L (ref 134–144)
Total Protein: 6.6 g/dL (ref 6.0–8.5)

## 2018-05-02 LAB — LIPID PANEL W/O CHOL/HDL RATIO
CHOLESTEROL TOTAL: 160 mg/dL (ref 100–199)
HDL: 75 mg/dL (ref >39)
LDL Calculated: 55 mg/dL (ref 0–99)
Triglycerides: 151 mg/dL — ABNORMAL HIGH (ref 0–149)
VLDL Cholesterol Cal: 30 mg/dL (ref 5–40)

## 2018-05-02 NOTE — Progress Notes (Signed)
Notified pt by mychart

## 2018-05-29 IMAGING — CR DG SHOULDER 2+V*L*
3 series · 3 of 3 positions shown · non-contrast
Comparison: None.

CLINICAL DATA: Left-sided shoulder pain, no known injury, initial
encounter

EXAM:
LEFT SHOULDER - 2+ VIEW

[shoulder grashey]
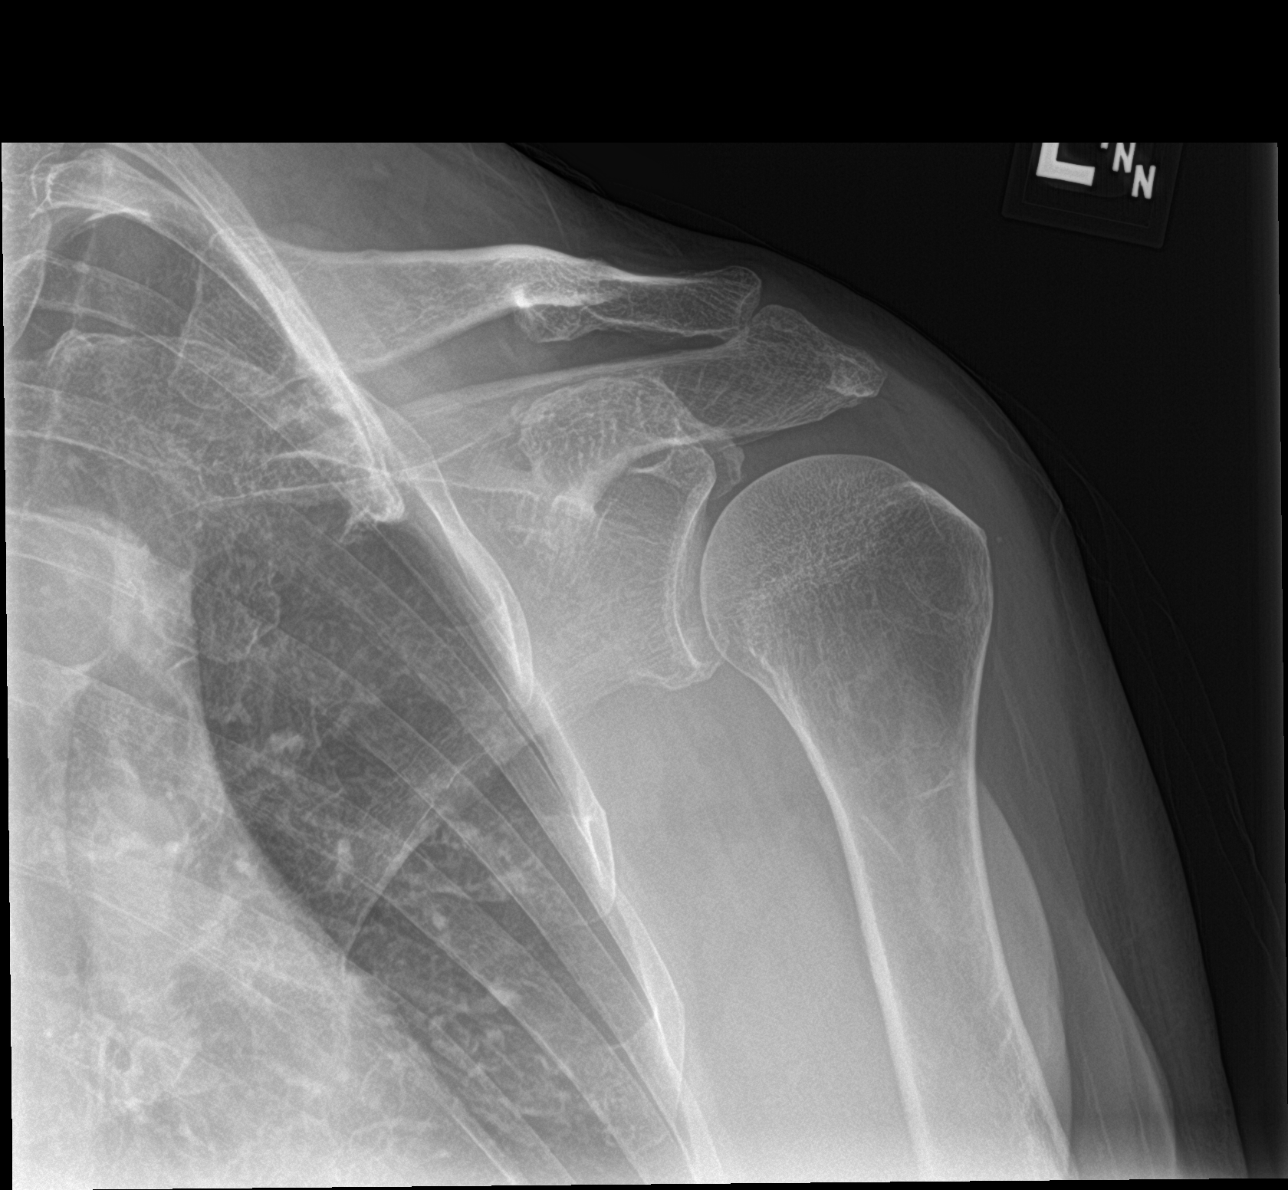

[shoulder y view]
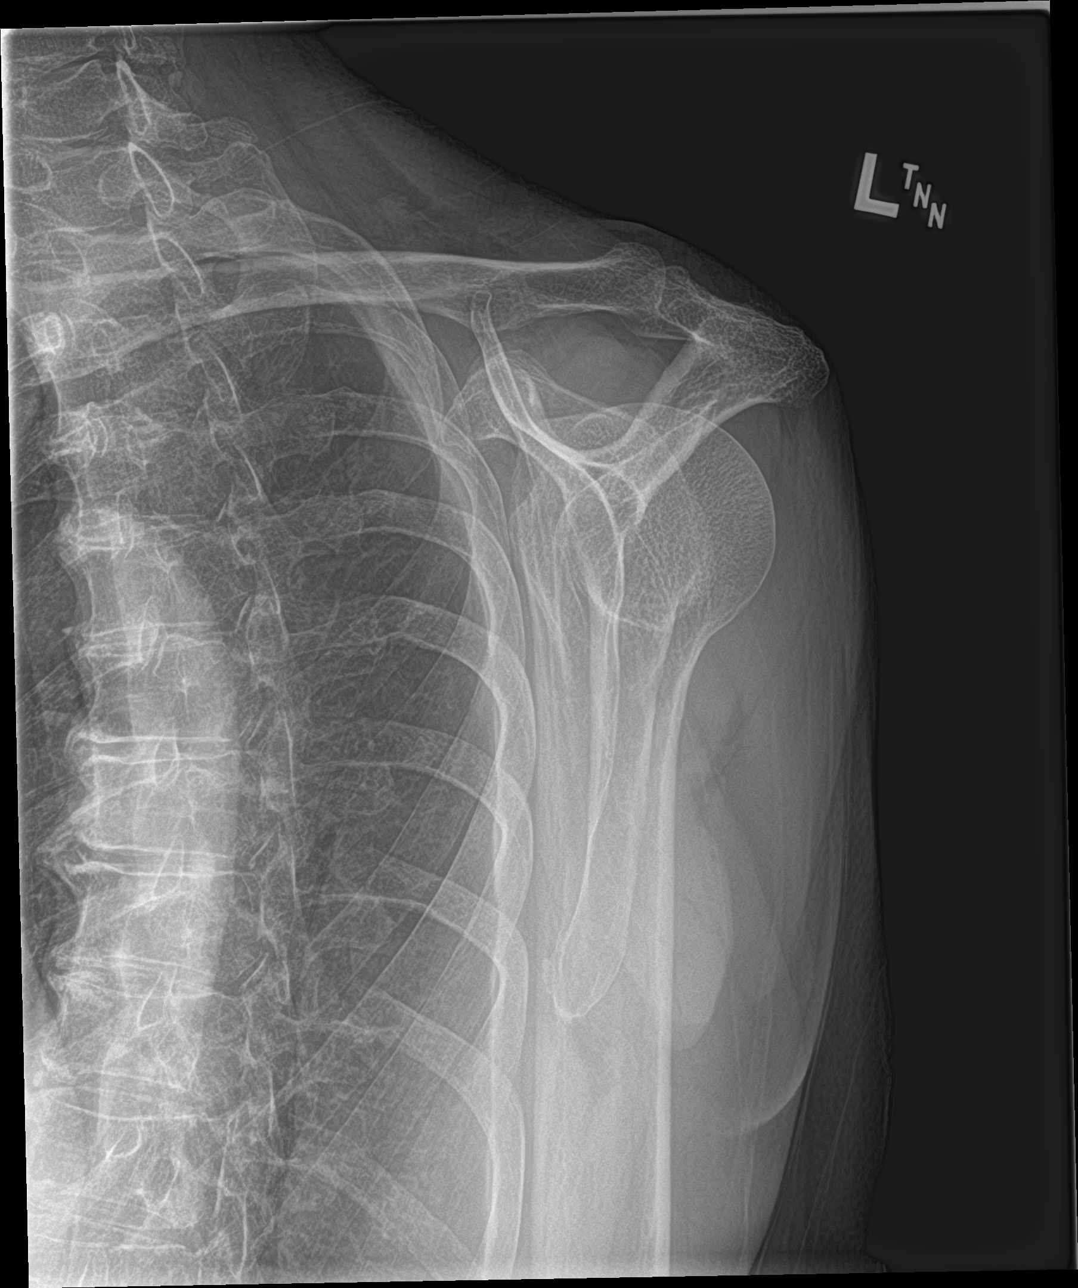

[shoulder axillary]
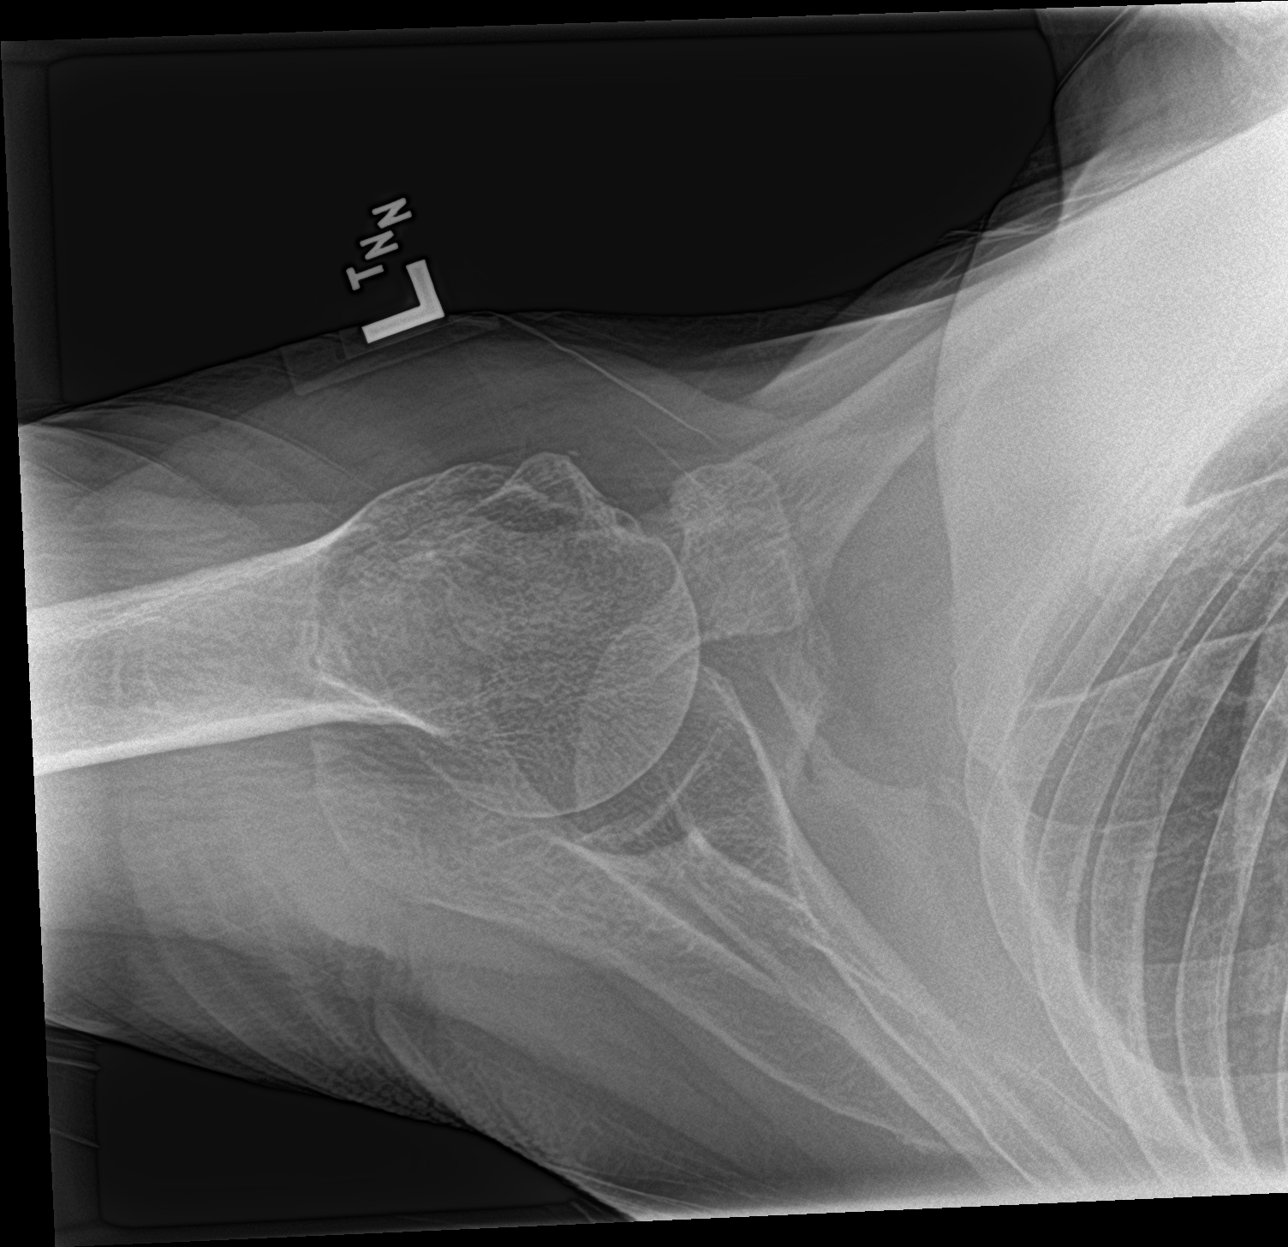

[3 of 3 positions shown; findings below may reference images not displayed]

FINDINGS: There is no evidence of fracture or dislocation. There is no
evidence of arthropathy or other focal bone abnormality. Soft
tissues are unremarkable.
IMPRESSION: No acute abnormality noted.

## 2018-06-20 ENCOUNTER — Other Ambulatory Visit: Payer: Self-pay | Admitting: Unknown Physician Specialty

## 2018-06-20 DIAGNOSIS — Z1231 Encounter for screening mammogram for malignant neoplasm of breast: Secondary | ICD-10-CM

## 2018-07-13 ENCOUNTER — Ambulatory Visit
Admission: RE | Admit: 2018-07-13 | Discharge: 2018-07-13 | Disposition: A | Discharge status: Home / Self Care | Payer: Medicare Other | Source: Ambulatory Visit | Attending: Unknown Physician Specialty | Admitting: Unknown Physician Specialty

## 2018-07-13 DIAGNOSIS — Z1231 Encounter for screening mammogram for malignant neoplasm of breast: Secondary | ICD-10-CM | POA: Diagnosis present

## 2018-09-25 ENCOUNTER — Encounter: Payer: Self-pay | Admitting: Family Medicine

## 2018-09-25 ENCOUNTER — Ambulatory Visit: Payer: Medicare Other | Admitting: Family Medicine

## 2018-09-25 VITALS — BP 134/74 | HR 69 | Temp 98.8°F | Ht 66.0 in | Wt 148.3 lb

## 2018-09-25 DIAGNOSIS — R300 Dysuria: Secondary | ICD-10-CM

## 2018-09-25 DIAGNOSIS — R197 Diarrhea, unspecified: Secondary | ICD-10-CM | POA: Diagnosis not present

## 2018-09-25 LAB — UA/M W/RFLX CULTURE, ROUTINE
BILIRUBIN UA: NEGATIVE
Glucose, UA: NEGATIVE
KETONES UA: NEGATIVE
LEUKOCYTES UA: NEGATIVE
NITRITE UA: NEGATIVE
PH UA: 7 (ref 5.0–7.5)
Protein, UA: NEGATIVE
Specific Gravity, UA: 1.01 (ref 1.005–1.030)
Urobilinogen, Ur: 0.2 mg/dL (ref 0.2–1.0)

## 2018-09-25 LAB — MICROSCOPIC EXAMINATION

## 2018-09-25 MED ORDER — CIPROFLOXACIN HCL 250 MG PO TABS: 250.0000 mg | ORAL_TABLET | Freq: Two times a day (BID) | ORAL | 0 refills | Status: DC

## 2018-09-25 NOTE — Patient Instructions (Signed)
Follow up as scheduled.  

## 2018-09-25 NOTE — Progress Notes (Signed)
BP 134/74   Pulse 69   Temp 98.8 F (37.1 C)   Ht 5\' 6"  (1.676 m)   Wt 148 lb 5 oz (67.3 kg)   LMP  (LMP Unknown)   SpO2 99%   BMI 23.94 kg/m    Subjective:    Patient ID: Savannah [name], female    DOB: 10-Dec-1946, 72 y.o.   MRN: [id number]  HPI: Savannah [name] is a 72 y.o. female  Chief Complaint  Patient presents with  . Dysuria   Dysuria that started 4 days ago. Now also having urinary frequency. States she's never had a UTI in the past. Just got back from [place] several days ago where she had frequent diarrhea. No fevers, chills, back pain, N/V. Not taking anything OTC. Drinking tons of fluids and notes she is starting to feel better.   Relevant past medical, surgical, family and social history reviewed and updated as indicated. Interim medical history since our last visit reviewed. Allergies and medications reviewed and updated.  Review of Systems  Per HPI unless specifically indicated above     Objective:    BP 134/74   Pulse 69   Temp 98.8 F (37.1 C)   Ht 5\' 6"  (1.676 m)   Wt 148 lb 5 oz (67.3 kg)   LMP  (LMP Unknown)   SpO2 99%   BMI 23.94 kg/m   Wt Readings from Last 3 Encounters:  09/25/18 148 lb 5 oz (67.3 kg)  05/01/18 148 lb (67.1 kg)  05/01/18 148 lb (67.1 kg)    Physical Exam  Constitutional: She is oriented to person, place, and time. She appears well-developed and well-nourished. No distress.  Eyes: Conjunctivae and EOM are normal.  Neck: Normal range of motion. Neck supple.  Cardiovascular: Normal rate and regular rhythm.  Pulmonary/Chest: Effort normal and breath sounds normal.  Abdominal: Soft. Bowel sounds are normal. She exhibits no distension. There is no tenderness.  Musculoskeletal: Normal range of motion. She exhibits no tenderness (No CVA tenderness b/l).  Neurological: She is alert and oriented to person, place, and time.  Skin: Skin is warm and dry.  Psychiatric: She has a normal mood and affect. Her behavior is normal.  Nursing note  and vitals reviewed.   Results for orders placed or performed in visit on 05/01/18  Comprehensive metabolic panel  Result Value Ref Range   Glucose 79 65 - 99 mg/dL   BUN 11 8 - 27 mg/dL   Creatinine, Ser 0.81 0.57 - 1.00 mg/dL   GFR calc non Af Amer 72 >59 mL/min/1.73   GFR calc Af Amer 83 >59 mL/min/1.73   BUN/Creatinine Ratio 14 12 - 28   Sodium 144 134 - 144 mmol/L   Potassium 4.0 3.5 - 5.2 mmol/L   Chloride 103 96 - 106 mmol/L   CO2 26 20 - 29 mmol/L   Calcium 9.5 8.7 - 10.3 mg/dL   Total Protein 6.6 6.0 - 8.5 g/dL   Albumin 4.6 3.5 - 4.8 g/dL   Globulin, Total 2.0 1.5 - 4.5 g/dL   Albumin/Globulin Ratio 2.3 (H) 1.2 - 2.2   Bilirubin Total 0.3 0.0 - 1.2 mg/dL   Alkaline Phosphatase 66 39 - 117 IU/L   AST 19 0 - 40 IU/L   ALT 11 0 - 32 IU/L  Lipid Panel w/o Chol/HDL Ratio  Result Value Ref Range   Cholesterol, Total 160 100 - 199 mg/dL   Triglycerides 151 (H) 0 - 149 mg/dL   HDL  75 >39 mg/dL   VLDL Cholesterol Cal 30 5 - 40 mg/dL   LDL Calculated 55 0 - 99 mg/dL  TSH  Result Value Ref Range   TSH 1.340 0.450 - 4.500 uIU/mL      Assessment & Plan:   Problem List Items Addressed This Visit    None    Visit Diagnoses    Dysuria    -  Primary   Minimal bacteria in U/A, suspect amount of fluids flushing things out. Cipro sent in case still some bacteria, probiotic samples given. Continue fluids   Relevant Orders   UA/M w/rflx Culture, Routine   Diarrhea, unspecified type       Worse with recent travel, but continuous. Imodium prn, start daily probiotics. Can try elimiating gluten or dairy for a period. F/u if not improving       Follow up plan: Return for as scheduled.

## 2018-10-05 ENCOUNTER — Encounter: Payer: Self-pay | Admitting: Obstetrics and Gynecology

## 2018-10-05 ENCOUNTER — Other Ambulatory Visit (HOSPITAL_COMMUNITY)
Admission: RE | Admit: 2018-10-05 | Discharge: 2018-10-05 | Disposition: A | Discharge status: Home / Self Care | Payer: Medicare Other | Source: Ambulatory Visit | Attending: Obstetrics and Gynecology | Admitting: Obstetrics and Gynecology

## 2018-10-05 ENCOUNTER — Ambulatory Visit (INDEPENDENT_AMBULATORY_CARE_PROVIDER_SITE_OTHER): Payer: Medicare Other | Admitting: Obstetrics and Gynecology

## 2018-10-05 VITALS — BP 128/84 | Ht 67.0 in | Wt 150.0 lb

## 2018-10-05 DIAGNOSIS — N72 Inflammatory disease of cervix uteri: Secondary | ICD-10-CM

## 2018-10-05 DIAGNOSIS — Z1331 Encounter for screening for depression: Secondary | ICD-10-CM

## 2018-10-05 DIAGNOSIS — Z124 Encounter for screening for malignant neoplasm of cervix: Secondary | ICD-10-CM

## 2018-10-05 DIAGNOSIS — B977 Papillomavirus as the cause of diseases classified elsewhere: Secondary | ICD-10-CM | POA: Insufficient documentation

## 2018-10-05 DIAGNOSIS — Z1339 Encounter for screening examination for other mental health and behavioral disorders: Secondary | ICD-10-CM

## 2018-10-05 DIAGNOSIS — R87612 Low grade squamous intraepithelial lesion on cytologic smear of cervix (LGSIL): Secondary | ICD-10-CM | POA: Insufficient documentation

## 2018-10-05 DIAGNOSIS — Z01419 Encounter for gynecological examination (general) (routine) without abnormal findings: Secondary | ICD-10-CM

## 2018-10-05 DIAGNOSIS — Z01411 Encounter for gynecological examination (general) (routine) with abnormal findings: Secondary | ICD-10-CM | POA: Diagnosis not present

## 2018-10-05 NOTE — Progress Notes (Signed)
Routine Annual Gynecology Examination   PCP: Kathrine Haddock, NP  Chief Complaint  Patient presents with  . Annual Exam   History of Present Illness: Patient is a 72 y.o. No obstetric history on file. presents for annual exam. The patient has no complaints today.   Menopausal bleeding: denies  Menopausal symptoms: denies  Breast symptoms: denies  Last pap smear: 1 years ago.  Result Normal, HPV negative. 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   Last mammogram: 3 months ago.  Result Normal  DEXA last year: indicated osteoporosis. She was started on an anti-resorption medication by her PCP.   Tobacco use: denies current use Alcohol use: social drinker Exercise: moderately active  The patient wears seat belts: yes  She had a UTI last week. She has been treated.  She flushed her system and had a UA about 10 days ago and feels better now.   Past Medical History:  Diagnosis Date  . Anxiety   . Cancer (HCC)    skin- squamous  . HOH (hard of hearing)    WEARS AIDS  . Hyperlipidemia   . Hypertension    CONTROLLED ON MEDS  . Hypothyroidism   . IBS (irritable bowel syndrome)   . MVP (mitral valve prolapse)   . OA (osteoarthritis) of neck    JOINTS  . Osteoporosis   . Thyroid disease     Past Surgical History:  Procedure Laterality Date  . ANKLE FRACTURE SURGERY Left   . BREAST CYST ASPIRATION Right 90s  . COLONOSCOPY WITH PROPOFOL N/A 04/12/2016   Procedure: COLONOSCOPY WITH PROPOFOL;  Surgeon: Manya Silvas, MD;  Location: [facility] ENDOSCOPY;  Service: Endoscopy;  Laterality: N/A;  . FRACTURE SURGERY     ANKLE  . HAMMER TOE SURGERY Right 02/08/2018   Procedure: HAMMER TOE CORRECTION-5TH TOE, Excision soft tissue mass dorsal lateral right fifth toe and exostectomy dorsal lateral DIPJ fifth toe;  Surgeon: Samara Deist, DPM;  Location: [place];  Service: Podiatry;  Laterality: Right;  IVA LOCAL  . moses surgery  09/2015  . SINUS SURGERY WITH  INSTATRAK  10/2017   DEVIATED SEPTUM  . 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 Authorizing Provider  ALPRAZolam (XANAX) 0.25 MG tablet Take 1 tablet (0.25 mg total) by mouth at bedtime as needed for anxiety. 05/01/18   Kathrine Haddock, NP  atorvastatin (LIPITOR) 20 MG tablet Take 1 tablet (20 mg total) by mouth daily. AM 05/01/18   Kathrine Haddock, NP  ciprofloxacin (CIPRO) 250 MG tablet Take 1 tablet (250 mg total) by mouth 2 (two) times daily. 09/25/18   Volney American, PA-C  fluticasone [facility]) 50 MCG/ACT nasal spray Place 2 sprays into both nostrils daily. Patient taking differently: Place 1 spray into both nostrils daily.  09/13/17   Kathrine Haddock, NP  ibandronate (BONIVA) 150 MG tablet Take one tab. every 30 days,Take in the am with a 8oz glass of water, an empty stomach,nothing by mouth for 30 min, dont lie down for 30 min 03/01/18   Volney American, PA-C  levothyroxine (SYNTHROID) 75 MCG tablet Take 1 tablet (75 mcg total) by mouth daily before breakfast. 05/01/18   Kathrine Haddock, NP  metoprolol succinate (TOPROL-XL) 50 MG 24 hr tablet Take 1 tablet (50 mg total) by mouth daily. Take 1/2 tablet daily 05/01/18   Kathrine Haddock, NP  scopolamine (TRANSDERM-SCOP, 1.5 MG,) 1  MG/3DAYS Place 1 patch (1.5 mg total) onto the skin every 3 (three) days. 05/01/18   Kathrine Haddock, NP    Allergies  Allergen Reactions  . Amoxil [Amoxicillin] Itching  . Penicillins Other (See Comments)    "passed out"    Social History   Socioeconomic History  . Marital status: Married    Spouse name: Not on file  . Number of children: Not on file  . Years of education: Not on file  . Highest education level: Not on file  Occupational History  . Not on file  Social Needs  . Financial resource strain: Not hard at all  . Food insecurity:    Worry: Never true    Inability: Never true  . Transportation  needs:    Medical: No    Non-medical: No  Tobacco Use  . Smoking status: Former Smoker    Packs/day: 1.00    Years: 4.00    Pack years: 4.00    Types: Cigarettes    Last attempt to quit: 1970    Years since quitting: 49.8  . Smokeless tobacco: Never Used  Substance and Sexual Activity  . Alcohol use: Yes    Alcohol/week: 14.0 standard drinks    Types: 14 Glasses of wine per week  . Drug use: No  . Sexual activity: Yes  Lifestyle  . Physical activity:    Days per week: 7 days    Minutes per session: 40 min  . Stress: Not at all  Relationships  . Social connections:    Talks on phone: More than three times a week    Gets together: More than three times a week    Attends religious service: Never    Active member of club or organization: No    Attends meetings of clubs or organizations: Never    Relationship status: Married  . Intimate partner violence:    Fear of current or ex partner: No    Emotionally abused: No    Physically abused: No    Forced sexual activity: No  Other Topics Concern  . Not on file  Social History Narrative  . Not on file    Family History  Problem Relation Age of Onset  . Cancer Mother        colon  . Hypertension Mother   . Cancer Father        lung  . Hyperlipidemia Brother   . Breast cancer Neg Hx     Review of Systems  Constitutional: Negative.   HENT: Negative.   Eyes: Negative.   Respiratory: Negative.   Cardiovascular: Negative.   Gastrointestinal: Negative.   Genitourinary: Negative.   Musculoskeletal: Negative.   Skin: Negative.   Neurological: Negative.   Psychiatric/Behavioral: Negative.      Physical Exam Vitals: BP 128/84   Ht 5\' 7"  (1.702 m)   Wt 150 lb (68 kg)   LMP  (LMP Unknown)   BMI 23.49 kg/m   Physical Exam  Constitutional: She is oriented to person, place, and time. She appears well-developed and well-nourished. No distress.  Genitourinary: Uterus normal. Pelvic exam was performed with patient  supine. There is no rash, tenderness, lesion or injury on the right labia. There is no rash, tenderness, lesion or injury on the left labia. No erythema, tenderness or bleeding in the vagina. No signs of injury around the vagina. No vaginal discharge found. Right adnexum does not display mass, does not display tenderness and does not display fullness. Left adnexum does  not display mass, does not display tenderness and does not display fullness. Cervix does not exhibit motion tenderness, lesion, discharge or polyp.   Uterus is mobile and anteverted. Uterus is not enlarged, tender or exhibiting a mass.  HENT:  Head: Normocephalic and atraumatic.  Eyes: EOM are normal. No scleral icterus.  Neck: Normal range of motion. Neck supple. No thyromegaly present.  Cardiovascular: Normal rate and regular rhythm. Exam reveals no gallop and no friction rub.  No murmur heard. Pulmonary/Chest: Effort normal and breath sounds normal. No respiratory distress. She has no wheezes. She has no rales. Right breast exhibits no inverted nipple, no mass, no nipple discharge, no skin change and no tenderness. Left breast exhibits no inverted nipple, no mass, no nipple discharge, no skin change and no tenderness.  Abdominal: Soft. Bowel sounds are normal. She exhibits no distension and no mass. There is no tenderness. There is no rebound and no guarding.  Musculoskeletal: Normal range of motion. She exhibits no edema or tenderness.  Lymphadenopathy:    She has no cervical adenopathy.       Right: No inguinal adenopathy present.       Left: No inguinal adenopathy present.  Neurological: She is alert and oriented to person, place, and time. No cranial nerve deficit.  Skin: Skin is warm and dry. No rash noted. No erythema.  Psychiatric: She has a normal mood and affect. Her behavior is normal. Judgment normal.     Female chaperone present for pelvic and breast  portions of the physical exam  Results: AUDIT Questionnaire  (screen for alcoholism): 6 PHQ-9: 0   Assessment and Plan:  72 y.o. female here for routine annual gynecologic examination  Plan: Problem List Items Addressed This Visit    None    Visit Diagnoses    Women's annual routine gynecological examination    -  Primary   Relevant Orders   Cytology - PAP   Screening for depression       Screening for alcoholism       Pap smear for cervical cancer screening       LGSIL on Pap smear of cervix       Relevant Orders   Cytology - PAP   High risk human papilloma virus (HPV) infection of cervix       Relevant Orders   Cytology - PAP      Screening: -- Blood pressure screen managed by PCP -- Colonoscopy - managed by PCP -- Mammogram - not due -- Weight screening: normal -- Depression screening negative (PHQ-9) -- Nutrition: normal -- cholesterol screening: per PCP -- osteoporosis screening: not due. Patient diagnosed last year and is taking Boniva (though not regularly, per her) -- tobacco screening: not using -- alcohol screening: AUDIT questionnaire indicates low-risk usage. -- family history of breast cancer screening: done. not at high risk. -- no evidence of domestic violence or intimate partner violence. -- STD screening: gonorrhea/chlamydia NAAT not collected per patient request. -- pap smear collected per ASCCP guidelines -- flu vaccine plans to get at [facility] -- HPV vaccination series: not eligilbe  Prentice Docker, MD 10/05/2018 9:57 AM

## 2018-10-09 LAB — CYTOLOGY - PAP
DIAGNOSIS: NEGATIVE
HPV (WINDOPATH): NOT DETECTED

## 2019-03-05 ENCOUNTER — Other Ambulatory Visit: Payer: Self-pay | Admitting: Family Medicine

## 2019-03-05 ENCOUNTER — Other Ambulatory Visit: Payer: Self-pay | Admitting: Unknown Physician Specialty

## 2019-03-05 DIAGNOSIS — M810 Age-related osteoporosis without current pathological fracture: Secondary | ICD-10-CM

## 2019-03-06 NOTE — Telephone Encounter (Signed)
Requested Prescriptions  Pending Prescriptions Disp Refills  . metoprolol succinate (TOPROL-XL) 50 MG 24 hr tablet [Pharmacy Med Name: METOPROLOL SUCC ER 50 MG TAB] 45 tablet 0    Sig: TAKE 1/2 TABLET DAILY     Cardiovascular:  Beta Blockers Passed - 03/05/2019  2:13 PM      Passed - Last BP in normal range    BP Readings from Last 1 Encounters:  10/05/18 128/84         Passed - Last Heart Rate in normal range    Pulse Readings from Last 1 Encounters:  09/25/18 69         Passed - Valid encounter within last 6 months    Recent Outpatient Visits          5 months ago Dysuria   [facility] Volney American, [place]   10 months ago Annual physical exam   [facility] Kathrine Haddock, NP   1 year ago Left arm pain   [facility] Merrie Roof [place], [place]   1 year ago Acute suppurative otitis media of left ear without spontaneous rupture of tympanic membrane, recurrence not specified   [facility] Kathrine Haddock, NP   1 year ago Maxillary sinusitis, unspecified chronicity   [facility] Kathrine Haddock, NP      Future Appointments            In 12 month Orene Desanctis, Lilia Argue, [place], [place]   In 1 month  [employer], [place]

## 2019-03-06 NOTE — Telephone Encounter (Signed)
Requested medication (s) are due for refill today: yes  Requested medication (s) are on the active medication list: yes  Last refill:  10/27/18  Future visit scheduled: yes  Notes to clinic:  last calcium 2019    Requested Prescriptions  Pending Prescriptions Disp Refills   ibandronate (BONIVA) 150 MG tablet [Pharmacy Med Name: IBANDRONATE SODIUM 150 MG TAB] 3 tablet 0    Sig: Take one tab. every 30 days,Take in the am with a 8oz glass of water, an empty stomach,nothing by mouth for 30 min, dont lie down for 30 min     Endocrinology:  Bisphosphonates Failed - 03/05/2019 12:22 PM      Failed - Vitamin D in normal range and within 360 days    No results found for: [id number], [id number], [id number], 25OHVITD3, 25OHVITD2, 25OHVITD3, 25OHVITD2, 25OHVITD1, 25OHVITD2, 25OHVITD3, VD25OH       Passed - Ca in normal range and within 360 days    Calcium  Date Value Ref Range Status  05/01/2018 9.5 8.7 - 10.3 mg/dL Final         Passed - Valid encounter within last 12 months    Recent Outpatient Visits          5 months ago [place], [place], [place]   10 months ago Annual physical exam   [facility] Kathrine Haddock, NP   1 year ago Left arm pain   [facility] Merrie Roof [place], [place]   1 year ago Acute suppurative otitis media of left ear without spontaneous rupture of tympanic membrane, recurrence not specified   [facility] Kathrine Haddock, NP   1 year ago Maxillary sinusitis, unspecified chronicity   [facility] Kathrine Haddock, NP      Future Appointments            In 1 month Orene Desanctis, Lilia Argue, PA-C [employer], [place]   In 1 month  [employer], [place]

## 2019-03-29 NOTE — Telephone Encounter (Signed)
I think an issue that has been going on since December does not require an immediate response so I'll defer to [place] since he know the patient

## 2019-04-04 ENCOUNTER — Ambulatory Visit: Payer: Medicare Other | Admitting: Obstetrics and Gynecology

## 2019-04-04 ENCOUNTER — Other Ambulatory Visit (HOSPITAL_COMMUNITY)
Admission: RE | Admit: 2019-04-04 | Discharge: 2019-04-04 | Disposition: A | Discharge status: Home / Self Care | Payer: Medicare Other | Source: Ambulatory Visit | Attending: Obstetrics and Gynecology | Admitting: Obstetrics and Gynecology

## 2019-04-04 ENCOUNTER — Other Ambulatory Visit: Payer: Self-pay

## 2019-04-04 ENCOUNTER — Ambulatory Visit (INDEPENDENT_AMBULATORY_CARE_PROVIDER_SITE_OTHER): Payer: Medicare Other | Admitting: Obstetrics and Gynecology

## 2019-04-04 ENCOUNTER — Encounter: Payer: Self-pay | Admitting: Obstetrics and Gynecology

## 2019-04-04 VITALS — BP 124/74 | Wt 153.0 lb

## 2019-04-04 DIAGNOSIS — N763 Subacute and chronic vulvitis: Secondary | ICD-10-CM | POA: Insufficient documentation

## 2019-04-04 DIAGNOSIS — N904 Leukoplakia of vulva: Secondary | ICD-10-CM | POA: Diagnosis not present

## 2019-04-04 NOTE — Progress Notes (Signed)
Obstetrics & Gynecology Office Visit   Chief Complaint  Patient presents with  . Vaginal Itching   History of Present Illness: 73 y.o. menopausal female who presents with a 3 to 39-month history of vulvar itching.  She states the itching is predominantly on her right side and her periclitoral area.  She has noticed a couple of small bumps on that side.  She has tried hydrocortisone and Vaseline.  The hydrocortisone does not seem to help.  The Vaseline does appear to help somewhat.  She has tried changing soaps and detergents without relief.  She has no other history of vulvar conditions.  She has no associated symptoms.  She is greatly distressed by the symptoms.  She rates the itching as fairly bothersome, but not overly so.   Past Medical History:  Diagnosis Date  . Anxiety   . Cancer (HCC)    skin- squamous  . HOH (hard of hearing)    WEARS AIDS  . Hyperlipidemia   . Hypertension    CONTROLLED ON MEDS  . Hypothyroidism   . IBS (irritable bowel syndrome)   . MVP (mitral valve prolapse)   . OA (osteoarthritis) of neck    JOINTS  . Osteoporosis   . Thyroid disease     Past Surgical History:  Procedure Laterality Date  . ANKLE FRACTURE SURGERY Left   . BREAST CYST ASPIRATION Right 90s  . COLONOSCOPY WITH PROPOFOL N/A 04/12/2016   Procedure: COLONOSCOPY WITH PROPOFOL;  Surgeon: Manya Silvas, MD;  Location: [facility] ENDOSCOPY;  Service: Endoscopy;  Laterality: N/A;  . FRACTURE SURGERY     ANKLE  . HAMMER TOE SURGERY Right 02/08/2018   Procedure: HAMMER TOE CORRECTION-5TH TOE, Excision soft tissue mass dorsal lateral right fifth toe and exostectomy dorsal lateral DIPJ fifth toe;  Surgeon: Samara Deist, DPM;  Location: [place];  Service: Podiatry;  Laterality: Right;  IVA LOCAL  . moses surgery  09/2015  . SINUS SURGERY WITH INSTATRAK  10/2017   DEVIATED SEPTUM  . 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 Patient is postmenopausal.  Obstetric History: No obstetric history on file.  Family History  Problem Relation Age of Onset  . Cancer Mother        colon  . Hypertension Mother   . Cancer Father        lung  . Hyperlipidemia Brother   . Breast cancer Neg Hx     Social History   Socioeconomic History  . Marital status: Married    Spouse name: Not on file  . Number of children: Not on file  . Years of education: Not on file  . Highest education level: Not on file  Occupational History  . Not on file  Social Needs  . Financial resource strain: Not hard at all  . Food insecurity:    Worry: Never true    Inability: Never true  . Transportation needs:    Medical: No    Non-medical: No  Tobacco Use  . Smoking status: Former Smoker    Packs/day: 1.00    Years: 4.00    Pack years: 4.00    Types: Cigarettes    Last attempt to quit: 1970    Years since quitting: 50.3  . Smokeless tobacco: Never Used  Substance and Sexual Activity  . Alcohol use: Yes    Alcohol/week: 14.0 standard drinks    Types: 14 Glasses of wine per  week  . Drug use: No  . Sexual activity: Yes  Lifestyle  . Physical activity:    Days per week: 7 days    Minutes per session: 40 min  . Stress: Not at all  Relationships  . Social connections:    Talks on phone: More than three times a week    Gets together: More than three times a week    Attends religious service: Never    Active member of club or organization: No    Attends meetings of clubs or organizations: Never    Relationship status: Married  . Intimate partner violence:    Fear of current or ex partner: No    Emotionally abused: No    Physically abused: No    Forced sexual activity: No  Other Topics Concern  . Not on file  Social History Narrative  . Not on file    Allergies  Allergen Reactions  . Amoxil [Amoxicillin] Itching  . Penicillins Other (See Comments)    "passed out"    Prior to Admission medications    Medication Sig Start Date End Date Taking? Authorizing Provider  ALPRAZolam (XANAX) 0.25 MG tablet Take 1 tablet (0.25 mg total) by mouth at bedtime as needed for anxiety. 05/01/18   Kathrine Haddock, NP  atorvastatin (LIPITOR) 20 MG tablet Take 1 tablet (20 mg total) by mouth daily. AM 05/01/18   Kathrine Haddock, NP  ciprofloxacin (CIPRO) 250 MG tablet Take 1 tablet (250 mg total) by mouth 2 (two) times daily. 09/25/18   Volney American, PA-C  fluticasone [facility]) 50 MCG/ACT nasal spray Place 2 sprays into both nostrils daily. Patient taking differently: Place 1 spray into both nostrils daily.  09/13/17   Kathrine Haddock, NP  ibandronate (BONIVA) 150 MG tablet Take one tab. every 30 days,Take in the am with a 8oz glass of water,an empty stomach,nothing by mouth for 30 min, dont lie down for 30 min 03/06/19   Volney American, PA-C  levothyroxine (SYNTHROID) 75 MCG tablet Take 1 tablet (75 mcg total) by mouth daily before breakfast. 05/01/18   Kathrine Haddock, NP  metoprolol succinate (TOPROL-XL) 50 MG 24 hr tablet TAKE 1/2 TABLET DAILY 03/06/19   Cannady, Henrine Screws T, NP  scopolamine (TRANSDERM-SCOP, 1.5 MG,) 1 MG/3DAYS Place 1 patch (1.5 mg total) onto the skin every 3 (three) days. 05/01/18   Kathrine Haddock, NP    Review of Systems  Constitutional: Negative.   HENT: Negative.   Eyes: Negative.   Respiratory: Negative.   Cardiovascular: Negative.   Gastrointestinal: Negative.   Genitourinary: Negative.        See HPI  Musculoskeletal: Negative.   Skin: Negative.   Neurological: Negative.   Psychiatric/Behavioral: Negative.      Physical Exam BP 124/74   Wt 153 lb (69.4 kg)   LMP  (LMP Unknown)   BMI 23.96 kg/m  No LMP recorded (lmp unknown). Patient is postmenopausal. Physical Exam Constitutional:      General: She is not in acute distress.    Appearance: Normal appearance.  Genitourinary:     Pelvic exam was performed with patient in the lithotomy position.     Inguinal canal  and urethra normal.     Vulval lesion and rash present.     No vulval condylomata, tenderness or ulcerations noted.     No signs of labial injury.  There is labial fusion.    No posterior fourchette injury or lesion present.     HENT:  Head: Normocephalic and atraumatic.  Eyes:     General: No scleral icterus.    Conjunctiva/sclera: Conjunctivae normal.  Neurological:     General: No focal deficit present.     Mental Status: She is alert and oriented to person, place, and time.     Cranial Nerves: No cranial nerve deficit.  Psychiatric:        Mood and Affect: Mood normal.        Behavior: Behavior normal.        Judgment: Judgment normal.    VULVAR BIOPSY NOTE The indications for vulvar biopsy (rule out neoplasia, establish lichen sclerosus diagnosis) were reviewed.   Risks of the biopsy including pain, bleeding, infection, inadequate specimen, scarring and need for additional procedures  were discussed. The patient stated understanding and agreed to undergo procedure today. Consent was signed,  time out performed.   The patient's vulva was prepped with Betadine. 1% lidocaine was injected into area of concern. A 4 -mm punch biopsy was done, biopsy tissue was picked up with sterile forceps and sterile scissors were used to excise the lesion.  Small bleeding was noted and hemostasis was achieved using a 3-0 vicryl tied in a figure-of-eight stitch.  The patient tolerated the procedure well. Post-procedure instructions  (pelvic rest for one week) were given to the patient. The patient is to call with heavy bleeding, fever greater than 100.4, foul smelling vaginal discharge or other concerns.    Female chaperone present for pelvic and breast  portions of the physical exam  Assessment: 73 y.o. No obstetric history on file. female here for  1. Chronic vulvitis      Plan: Problem List Items Addressed This Visit    None    Visit Diagnoses    Chronic vulvitis    -  Primary    Relevant Orders   Surgical pathology     Discussed the differential diagnosis, which includes neoplasia, lichen sclerosis, lichen simplex, etc.  Will await results from pathology for treatment.  Discussed conservative measures at home, such as; no usage of soap, avoid scratching when possible, use of an emollient to protect the area. Will call with results.   20 minutes spent in face to face discussion with > 50% spent in counseling,management, and coordination of care of her chronic vulvitis.   Prentice Docker, MD 04/04/2019 5:03 PM

## 2019-04-11 ENCOUNTER — Telehealth: Payer: Self-pay | Admitting: Obstetrics and Gynecology

## 2019-04-11 NOTE — Telephone Encounter (Signed)
Unable to leave generic VM. Will send a message through [place]

## 2019-04-12 ENCOUNTER — Other Ambulatory Visit: Payer: Self-pay | Admitting: Obstetrics and Gynecology

## 2019-04-12 DIAGNOSIS — N9089 Other specified noninflammatory disorders of vulva and perineum: Secondary | ICD-10-CM

## 2019-04-12 MED ORDER — TRIAMCINOLONE ACETONIDE 0.1 % EX OINT
1.0000 | TOPICAL_OINTMENT | Freq: Two times a day (BID) | CUTANEOUS | 0 refills | Status: DC
Start: 2019-04-12 — End: 2019-05-03

## 2019-04-12 NOTE — Telephone Encounter (Signed)
Patient is calling to speak to Dr. Glennon Mac about results and prescription called in

## 2019-04-24 NOTE — Telephone Encounter (Signed)
Would you mind calling Savannah [name] to see if she still has questions? thanks

## 2019-04-25 ENCOUNTER — Telehealth: Payer: Self-pay

## 2019-04-25 NOTE — Telephone Encounter (Signed)
Patient scheduled for an AWV on 05/03/2019 with [facility], Due to Covid-19 pandemic this is unable to be done in office, called patient to see if they are able to do this virtually/telephonically. Left message for patient to call back.  If patient can do virtual visit schedule for 05/10/2019 in AM  Direct call back [phone]

## 2019-05-01 ENCOUNTER — Other Ambulatory Visit: Payer: Self-pay

## 2019-05-01 NOTE — Telephone Encounter (Signed)
Patient has appointment 05/03/19.

## 2019-05-02 MED ORDER — LEVOTHYROXINE SODIUM 75 MCG PO TABS: 75.0000 ug | ORAL_TABLET | Freq: Every day | ORAL | 0 refills | Status: DC

## 2019-05-03 ENCOUNTER — Ambulatory Visit: Payer: Self-pay

## 2019-05-03 ENCOUNTER — Encounter: Payer: Self-pay | Admitting: Family Medicine

## 2019-05-03 ENCOUNTER — Other Ambulatory Visit: Payer: Self-pay

## 2019-05-03 ENCOUNTER — Ambulatory Visit (INDEPENDENT_AMBULATORY_CARE_PROVIDER_SITE_OTHER): Payer: Medicare Other | Admitting: Family Medicine

## 2019-05-03 VITALS — BP 134/78 | HR 68 | Temp 96.6°F | Ht 66.0 in | Wt 146.1 lb

## 2019-05-03 DIAGNOSIS — R350 Frequency of micturition: Secondary | ICD-10-CM | POA: Diagnosis not present

## 2019-05-03 DIAGNOSIS — F419 Anxiety disorder, unspecified: Secondary | ICD-10-CM

## 2019-05-03 DIAGNOSIS — M810 Age-related osteoporosis without current pathological fracture: Secondary | ICD-10-CM | POA: Diagnosis not present

## 2019-05-03 DIAGNOSIS — I10 Essential (primary) hypertension: Secondary | ICD-10-CM | POA: Diagnosis not present

## 2019-05-03 DIAGNOSIS — E039 Hypothyroidism, unspecified: Secondary | ICD-10-CM

## 2019-05-03 DIAGNOSIS — E7800 Pure hypercholesterolemia, unspecified: Secondary | ICD-10-CM

## 2019-05-03 MED ORDER — SCOPOLAMINE 1 MG/3DAYS TD PT72: 1.0000 | MEDICATED_PATCH | TRANSDERMAL | 0 refills | Status: DC

## 2019-05-03 MED ORDER — IBANDRONATE SODIUM 150 MG PO TABS: ORAL_TABLET | ORAL | 1 refills | Status: DC

## 2019-05-03 MED ORDER — ATORVASTATIN CALCIUM 20 MG PO TABS: 20.0000 mg | ORAL_TABLET | Freq: Every day | ORAL | 1 refills | Status: DC

## 2019-05-03 MED ORDER — ALPRAZOLAM 0.25 MG PO TABS: 0.2500 mg | ORAL_TABLET | Freq: Every evening | ORAL | 0 refills | Status: DC | PRN

## 2019-05-03 MED ORDER — METOPROLOL SUCCINATE ER 50 MG PO TB24: ORAL_TABLET | ORAL | 1 refills | Status: DC

## 2019-05-03 MED ORDER — LEVOTHYROXINE SODIUM 75 MCG PO TABS: 75.0000 ug | ORAL_TABLET | Freq: Every day | ORAL | 1 refills | Status: DC

## 2019-05-03 NOTE — Progress Notes (Signed)
BP 134/78   Pulse 68   Temp (!) 96.6 F (35.9 C) (Oral)   Ht 5\' 6"  (1.676 m)   Wt 146 lb 1.6 oz (66.3 kg)   LMP  (LMP Unknown)   BMI 23.58 kg/m    Subjective:    Patient ID: Savannah [name], female    DOB: [birth year], 73 y.o.   MRN: [id number]  HPI: Savannah [name] is a 73 y.o. female  Chief Complaint  Patient presents with  . Hyperlipidemia  . Medication Refill    boniva    . This visit was completed via telephone due to the restrictions of the COVID-19 pandemic. All issues as above were discussed and addressed but no physical exam was performed. If it was felt that the patient should be evaluated in the office, they were directed there. The patient verbally consented to this visit. Patient was unable to complete an audio/visual visit due to Technical difficulties,Lack of internet. Due to the catastrophic nature of the COVID-19 pandemic, this visit was done through audio contact only. . Location of the patient: home . Location of the provider: work . Those involved with this call:  . Provider: Merrie Roof, PA-C . CMA: Lesle Chris, [place] . Front Desk/Registration: Jill Side  . Time spent on call: 25 minutes on the phone discussing health concerns. 10 minutes total spent in review of patient's record and preparation of their chart. I verified patient identity using two factors (patient name and date of birth). Patient consents verbally to being seen via telemedicine visit today.   Here today for 6 month f/u chronic conditions. States everything seems to be going well. Taking all medications faithfully without side effects. Denies CP, SOB, HAs, dizziness, claudication, myalgias.   Taking 75 mcg synthroid for hypothyroidism, levels have been stable on this dose. Asymptomatic.  Urinary frequency the past few weeks - month. Denies dysuria, hematuria, flank pain, fevers, abdominal pain but does want her urine checked just in case.   Home bps when checked are typically 120s-130s/70s.  Sometimes jump up to 140s/80s if anxious or stressed. Lipitor going well for HLD management. Stays very active and eats well.   Right chest/shoulder pain at mealtimes - GI gave her nexium recently which has done wonders for this pain. Taking regularly now.   Taking boniva for osteoporosis. Has been out for the last few months but typically consistently taking without issue.   Anxiety is under excellent control without medication typically. Keeps a small script of xanax on hand in case needed, but barely used any the past year and still has a practically full bottle from 12 months ago.   States she uses transderm patches for deep sea fishing trips, likes to keep some on hand for this purpose.   Depression screen [facility] 2/9 05/03/2019 05/01/2018 04/15/2017  Decreased Interest 0 0 0  Down, Depressed, Hopeless 0 0 0  PHQ - 2 Score 0 0 0  Altered sleeping 0 - 0  Tired, decreased energy 0 - 0  Change in appetite 0 - 0  Feeling bad or failure about yourself  0 - 0  Trouble concentrating 0 - 0  Moving slowly or fidgety/restless 0 - 0  Suicidal thoughts 0 - 0  PHQ-9 Score 0 - 0   GAD 7 : Generalized Anxiety Score 05/03/2019  Nervous, Anxious, on Edge 0  Control/stop worrying 0  Worry too much - different things 0  Trouble relaxing 0  Restless 0  Easily annoyed or irritable  0  Afraid - awful might happen 0  Total GAD 7 Score 0     Relevant past medical, surgical, family and social history reviewed and updated as indicated. Interim medical history since our last visit reviewed. Allergies and medications reviewed and updated.  Review of Systems  Per HPI unless specifically indicated above     Objective:    BP 134/78   Pulse 68   Temp (!) 96.6 F (35.9 C) (Oral)   Ht 5\' 6"  (1.676 m)   Wt 146 lb 1.6 oz (66.3 kg)   LMP  (LMP Unknown)   BMI 23.58 kg/m   Wt Readings from Last 3 Encounters:  05/03/19 146 lb 1.6 oz (66.3 kg)  04/04/19 153 lb (69.4 kg)  10/05/18 150 lb (68 kg)     Physical Exam  Unable to perform PE as her internet connection did not support video technology  Results for orders placed or performed in visit on 10/05/18  HM PAP SMEAR  Result Value Ref Range   HM Pap smear normal/neg   Cytology - PAP  Result Value Ref Range   Adequacy      Satisfactory for evaluation. The presence or absence of an endocervical / transformation zone component cannot be determined because of atrophy.   Diagnosis      NEGATIVE FOR INTRAEPITHELIAL LESIONS OR MALIGNANCY.   HPV NOT DETECTED    Material Submitted CervicoVaginal Pap [ThinPrep Imaged]       Assessment & Plan:   Problem List Items Addressed This Visit      Cardiovascular and Mediastinum   Essential hypertension    Stable and under good control, continue current regimen and good lifestyle habits      Relevant Medications   atorvastatin (LIPITOR) 20 MG tablet   metoprolol succinate (TOPROL-XL) 50 MG 24 hr tablet   Other Relevant Orders   CBC with Differential/Platelet (Completed)   Comprehensive metabolic panel (Completed)     Endocrine   Hypothyroidism - Primary    Stable and under good control, recheck TSH and continue current regimen      Relevant Medications   levothyroxine (SYNTHROID) 75 MCG tablet   metoprolol succinate (TOPROL-XL) 50 MG 24 hr tablet   Other Relevant Orders   TSH (Completed)     Musculoskeletal and Integument   Osteoporosis    Stable on boniva, continue current regimen      Relevant Medications   ibandronate (BONIVA) 150 MG tablet     Other   Hyperlipidemia    Recheck lipids, adjust as needed. Continue current regimen      Relevant Medications   atorvastatin (LIPITOR) 20 MG tablet   metoprolol succinate (TOPROL-XL) 50 MG 24 hr tablet   Other Relevant Orders   Lipid Panel w/o Chol/HDL Ratio (Completed)   Chronic anxiety    Under excellent control with very rare prn use of xanax. 1 script typically lasts more than 1 year      Relevant Medications    ALPRAZolam (XANAX) 0.25 MG tablet    Other Visit Diagnoses    Urinary frequency       Will check U/A, tx as needed. Push fluids, probiotics recommended   Relevant Orders   UA/M w/rflx Culture, Routine (Completed)       Follow up plan: Return in about 6 months (around 11/03/2019) for CPE.

## 2019-05-04 ENCOUNTER — Other Ambulatory Visit: Payer: Self-pay

## 2019-05-04 ENCOUNTER — Other Ambulatory Visit: Payer: Medicare Other

## 2019-05-04 ENCOUNTER — Telehealth: Payer: Self-pay | Admitting: Family Medicine

## 2019-05-04 DIAGNOSIS — R350 Frequency of micturition: Secondary | ICD-10-CM

## 2019-05-04 DIAGNOSIS — E039 Hypothyroidism, unspecified: Secondary | ICD-10-CM

## 2019-05-04 DIAGNOSIS — I10 Essential (primary) hypertension: Secondary | ICD-10-CM

## 2019-05-04 DIAGNOSIS — E7800 Pure hypercholesterolemia, unspecified: Secondary | ICD-10-CM

## 2019-05-04 MED ORDER — SULFAMETHOXAZOLE-TRIMETHOPRIM 800-160 MG PO TABS: 1.0000 | ORAL_TABLET | Freq: Two times a day (BID) | ORAL | 0 refills | Status: DC

## 2019-05-04 NOTE — Telephone Encounter (Signed)
For some reason it won't release this result to her - tried calling VM box full. Please try calling her again to let her know  Notes recorded by Volney American, PA-C on 05/04/2019 at 2:50 PM EDT Your urine is showing a small amount of bacteria so I'm waiting to see if the culture shows any abnormal bacterial growth. In the meantime, I'll have an antibiotic over at your pharmacy in case your symptoms worsen prior to getting your culture back. If you aren't having many symptoms, it is ok to wait until these results are back. I tried to call but your voicemail box was full. Feel free to return my call [phone]) or call the office number if you have questions!

## 2019-05-04 NOTE — Telephone Encounter (Signed)
Tried to call patient, no answer, unable to leave a message. 

## 2019-05-05 LAB — CBC WITH DIFFERENTIAL/PLATELET
Basophils Absolute: 0 10*3/uL (ref 0.0–0.2)
Basos: 1 %
EOS (ABSOLUTE): 0.1 10*3/uL (ref 0.0–0.4)
Eos: 2 %
Hematocrit: 43.9 % (ref 34.0–46.6)
Hemoglobin: 14.7 g/dL (ref 11.1–15.9)
Immature Grans (Abs): 0 10*3/uL (ref 0.0–0.1)
Immature Granulocytes: 0 %
Lymphocytes Absolute: 1.2 10*3/uL (ref 0.7–3.1)
Lymphs: 23 %
MCH: 31.5 pg (ref 26.6–33.0)
MCHC: 33.5 g/dL (ref 31.5–35.7)
MCV: 94 fL (ref 79–97)
Monocytes Absolute: 0.4 10*3/uL (ref 0.1–0.9)
Monocytes: 8 %
Neutrophils Absolute: 3.4 10*3/uL (ref 1.4–7.0)
Neutrophils: 66 %
Platelets: 201 10*3/uL (ref 150–450)
RBC: 4.66 x10E6/uL (ref 3.77–5.28)
RDW: 12 % (ref 11.7–15.4)
WBC: 5.2 10*3/uL (ref 3.4–10.8)

## 2019-05-05 LAB — COMPREHENSIVE METABOLIC PANEL
ALT: 16 IU/L (ref 0–32)
AST: 22 IU/L (ref 0–40)
Albumin/Globulin Ratio: 2.4 — ABNORMAL HIGH (ref 1.2–2.2)
Albumin: 4.7 g/dL (ref 3.7–4.7)
Alkaline Phosphatase: 57 IU/L (ref 39–117)
BUN/Creatinine Ratio: 12 (ref 12–28)
BUN: 11 mg/dL (ref 8–27)
Bilirubin Total: 0.7 mg/dL (ref 0.0–1.2)
CO2: 25 mmol/L (ref 20–29)
Calcium: 9.4 mg/dL (ref 8.7–10.3)
Chloride: 100 mmol/L (ref 96–106)
Creatinine, Ser: 0.9 mg/dL (ref 0.57–1.00)
GFR calc Af Amer: 73 mL/min/{1.73_m2} (ref >59)
GFR calc non Af Amer: 63 mL/min/{1.73_m2} (ref >59)
Globulin, Total: 2 g/dL (ref 1.5–4.5)
Glucose: 97 mg/dL (ref 65–99)
Potassium: 3.9 mmol/L (ref 3.5–5.2)
Sodium: 139 mmol/L (ref 134–144)
Total Protein: 6.7 g/dL (ref 6.0–8.5)

## 2019-05-05 LAB — LIPID PANEL W/O CHOL/HDL RATIO
Cholesterol, Total: 204 mg/dL — ABNORMAL HIGH (ref 100–199)
HDL: 71 mg/dL (ref >39)
LDL Calculated: 110 mg/dL — ABNORMAL HIGH (ref 0–99)
Triglycerides: 117 mg/dL (ref 0–149)
VLDL Cholesterol Cal: 23 mg/dL (ref 5–40)

## 2019-05-05 LAB — TSH: TSH: 3.08 u[IU]/mL (ref 0.450–4.500)

## 2019-05-06 LAB — UA/M W/RFLX CULTURE, ROUTINE
Bilirubin, UA: NEGATIVE
Glucose, UA: NEGATIVE
Ketones, UA: NEGATIVE
Nitrite, UA: NEGATIVE
Protein,UA: NEGATIVE
RBC, UA: NEGATIVE
Specific Gravity, UA: 1.015 (ref 1.005–1.030)
Urobilinogen, Ur: 0.2 mg/dL (ref 0.2–1.0)
pH, UA: 7 (ref 5.0–7.5)

## 2019-05-06 LAB — URINE CULTURE, REFLEX

## 2019-05-06 LAB — MICROSCOPIC EXAMINATION: RBC, Urine: NONE SEEN /HPF (ref 0–2)

## 2019-05-06 NOTE — Assessment & Plan Note (Signed)
Recheck lipids, adjust as needed. Continue current regimen 

## 2019-05-06 NOTE — Assessment & Plan Note (Signed)
Under excellent control with very rare prn use of xanax. 1 script typically lasts more than 1 year

## 2019-05-06 NOTE — Assessment & Plan Note (Signed)
Stable and under good control, recheck TSH and continue current regimen

## 2019-05-06 NOTE — Assessment & Plan Note (Signed)
Stable on boniva, continue current regimen

## 2019-05-06 NOTE — Assessment & Plan Note (Signed)
Stable and under good control, continue current regimen and good lifestyle habits

## 2019-05-07 ENCOUNTER — Other Ambulatory Visit: Payer: Self-pay | Admitting: Family Medicine

## 2019-05-07 DIAGNOSIS — E7800 Pure hypercholesterolemia, unspecified: Secondary | ICD-10-CM

## 2019-05-07 NOTE — Telephone Encounter (Signed)
Patient called back and notified we're still waiting on culture. Primary number changed. Patient doesn't have any symptoms.   Patient wants to know why in October she had few in her urine that she saw on MyChart but she doesn't remember it was addressed. Patient wants to know where the infection is coming from in her urine and wants to know if it's been there since October. She's aware there was only a small amount.   Patient said you can call her personally if needed. Still waiting on her lab panel results.

## 2019-05-07 NOTE — Telephone Encounter (Signed)
Called patient and discussed labs as well as sent mychart results message.

## 2019-05-07 NOTE — Telephone Encounter (Signed)
Tried calling patient again, no answer and no VM set up yet.  Send letter?

## 2019-05-10 ENCOUNTER — Ambulatory Visit (INDEPENDENT_AMBULATORY_CARE_PROVIDER_SITE_OTHER): Payer: Medicare Other

## 2019-05-10 VITALS — BP 134/68 | HR 69 | Temp 97.4°F | Ht 66.0 in | Wt 148.0 lb

## 2019-05-10 DIAGNOSIS — Z1239 Encounter for other screening for malignant neoplasm of breast: Secondary | ICD-10-CM | POA: Diagnosis not present

## 2019-05-10 DIAGNOSIS — Z0000 Encounter for general adult medical examination without abnormal findings: Secondary | ICD-10-CM

## 2019-05-10 NOTE — Progress Notes (Signed)
Subjective:   Savannah Savannah [name] is a 73 y.o. female who presents for Medicare Annual (Subsequent) preventive examination.  This visit is being conducted via phone call  - after an attmept to do on video chat - due to the COVID-19 pandemic. This Savannah [name] has given me verbal consent via phone to conduct this visit, Savannah [name] states they are participating from their home address. Some vital signs may be absent or Savannah [name] reported.   Savannah [name] identification: identified by name, DOB, and current address.    Review of Systems:   Cardiac Risk Factors include: advanced age (>64men, >58 women)     Objective:     Vitals: BP 134/68 Comment: Savannah [name] reported  Pulse 69 Comment: Savannah [name] reported  Temp (!) 97.4 F (36.3 C) Comment: Savannah [name] reported  Ht 5\' 6"  (1.676 m) Comment: Savannah [name] reported  Wt 148 lb (67.1 kg) Comment: Savannah [name] reported  LMP  (LMP Unknown)   BMI 23.89 kg/m   Body mass index is 23.89 kg/m.  Advanced Directives 05/01/2018 02/08/2018 04/17/2017 04/15/2017 04/13/2016 02/01/2016 09/16/2015  Does Savannah [name] Have a Medical Advance Directive? Yes Yes Yes Yes Yes Yes Yes  Type of Paramedic of [place];Living will [place];Living will [place];Living will [place];Living will [place];Living will - [place];Living will  Does Savannah [name] want to make changes to medical advance directive? - - No - Savannah [name] declined - - - No - Savannah [name] declined  Copy of [place] in Chart? No - copy requested Yes No - copy requested - - - No - copy requested  Would Savannah [name] like information on creating a medical advance directive? - - - - - - No - Savannah [name] declined information    Tobacco Social History   Tobacco Use  Smoking Status Former Smoker  . Packs/day: 1.00  . Years: 4.00  . Pack years: 4.00  . Types: Cigarettes  . Last attempt to quit: 1970  . Years since quitting: 50.4    Smokeless Tobacco Never Used     Counseling given: Not Answered   Clinical Intake:  Pre-visit preparation completed: Yes  Pain : No/denies pain     Nutritional Status: BMI of 19-24  Normal Nutritional Risks: None Diabetes: No  How often do you need to have someone help you when you read instructions, pamphlets, or other written materials from your doctor or pharmacy?: 1 - Never What is the last grade level you completed in school?: masters   Interpreter Needed?: No  Information entered by :: Jakevious Hollister,LPN   Past Medical History:  Diagnosis Date  . Anxiety   . Cancer (HCC)    skin- squamous  . HOH (hard of hearing)    WEARS AIDS  . Hyperlipidemia   . Hypertension    CONTROLLED ON MEDS  . Hypothyroidism   . IBS (irritable bowel syndrome)   . MVP (mitral valve prolapse)   . OA (osteoarthritis) of neck    JOINTS  . Osteoporosis   . Thyroid disease    Past Surgical History:  Procedure Laterality Date  . ANKLE FRACTURE SURGERY Left   . BREAST CYST ASPIRATION Right 90s  . COLONOSCOPY WITH PROPOFOL N/A 04/12/2016   Procedure: COLONOSCOPY WITH PROPOFOL;  Surgeon: Manya Silvas, MD;  Location: [facility] ENDOSCOPY;  Service: Endoscopy;  Laterality: N/A;  . FRACTURE SURGERY     ANKLE  . HAMMER TOE SURGERY Right 02/08/2018   Procedure: HAMMER TOE CORRECTION-5TH  TOE, Excision soft tissue mass dorsal lateral right fifth toe and exostectomy dorsal lateral DIPJ fifth toe;  Surgeon: Samara Deist, DPM;  Location: [facility];  Service: Podiatry;  Laterality: Right;  IVA LOCAL  . moses surgery  09/2015  . SINUS SURGERY WITH INSTATRAK  10/2017   DEVIATED SEPTUM  . 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 Cancer Mother        colon  . Hypertension Mother   . Cancer Father        lung  . Hyperlipidemia Brother   . Breast cancer Neg Hx    Social History   Socioeconomic History  . Marital status:  Married    Spouse name: Not on file  . Number of children: Not on file  . Years of education: Not on file  . Highest education level: Master's degree (e.g., MA, MS, MEng, MEd, MSW, MBA)  Occupational History  . Occupation: retired  Scientific laboratory technician  . Financial resource strain: Not hard at all  . Food insecurity:    Worry: Never true    Inability: Never true  . Transportation needs:    Medical: No    Non-medical: No  Tobacco Use  . Smoking status: Former Smoker    Packs/day: 1.00    Years: 4.00    Pack years: 4.00    Types: Cigarettes    Last attempt to quit: 1970    Years since quitting: 50.4  . Smokeless tobacco: Never Used  Substance and Sexual Activity  . Alcohol use: Yes    Alcohol/week: 10.0 standard drinks    Types: 10 Glasses of wine per week  . Drug use: No  . Sexual activity: Yes  Lifestyle  . Physical activity:    Days per week: 7 days    Minutes per session: 40 min  . Stress: Not at all  Relationships  . Social connections:    Talks on phone: More than three times a week    Gets together: More than three times a week    Attends religious service: Never    Active member of club or organization: No    Attends meetings of clubs or organizations: Never    Relationship status: Married  Other Topics Concern  . Not on file  Social History Narrative  . Not on file    Outpatient Encounter Medications as of 05/10/2019  Medication Sig  . ALPRAZolam (XANAX) 0.25 MG tablet Take 1 tablet (0.25 mg total) by mouth at bedtime as needed for anxiety.  Marland Kitchen atorvastatin (LIPITOR) 20 MG tablet Take 1 tablet (20 mg total) by mouth daily. AM  . esomeprazole (NEXIUM) 40 MG capsule Take by mouth.  . fluticasone (FLONASE) 50 MCG/ACT nasal spray Place 2 sprays into both nostrils daily. (Savannah [name] taking differently: Place 1 spray into both nostrils daily. )  . ibandronate (BONIVA) 150 MG tablet Take in the morning with a full glass of water, on an empty stomach, and do not take anything  else by mouth or lie down for the next 30 min.  Marland Kitchen levothyroxine (SYNTHROID) 75 MCG tablet Take 1 tablet (75 mcg total) by mouth daily before breakfast.  . metoprolol succinate (TOPROL-XL) 50 MG 24 hr tablet Take 1/2 tablet (25 mg) daily with or immediately following a meal.  . scopolamine (TRANSDERM-SCOP, 1.5 MG,) 1 MG/3DAYS Place 1 patch (1.5 mg total) onto the skin every 3 (three) days.  . [  DISCONTINUED] sulfamethoxazole-trimethoprim (BACTRIM DS) 800-160 MG tablet Take 1 tablet by mouth 2 (two) times daily. (Savannah [name] not taking: Reported on 05/10/2019)   No facility-administered encounter medications on file as of 05/10/2019.     Activities of Daily Living In your present state of health, do you have any difficulty performing the following activities: 05/10/2019  Hearing? Y  Comment hearing aids   Vision? N  Difficulty concentrating or making decisions? N  Walking or climbing stairs? N  Dressing or bathing? N  Doing errands, shopping? N  Preparing Food and eating ? N  Using the Toilet? N  In the past six months, have you accidently leaked urine? N  Do you have problems with loss of bowel control? N  Managing your Medications? N  Managing your Finances? N  Housekeeping or managing your Housekeeping? N  Some recent data might be hidden    Savannah [name] Care Team: Volney American, PA-C as PCP - General (Family Medicine) Beverly Gust, MD (Unknown Physician Specialty) Manya Silvas, MD (Gastroenterology) Corey Skains, MD as Consulting Physician (Cardiology) Oneta Rack, MD (Dermatology) Vladimir Crofts, MD (Neurology) Will Bonnet, MD as Attending Physician (Obstetrics and Gynecology) Anell Barr, OD (Optometry)    Assessment:   This is a routine wellness examination for [facility].  Exercise Activities and Dietary recommendations Current Exercise Habits: Home exercise routine, Type of exercise: walking, Frequency (Times/Week): 5, Intensity: Mild, Exercise  limited by: None identified  Goals    . DIET - INCREASE WATER INTAKE     Recommend drinking at least 6-8 glasses of water a day        Fall Risk: Fall Risk  05/10/2019 05/03/2019 05/01/2018 04/15/2017 04/13/2016  Falls in the past year? 0 0 No No No  Number falls in past yr: - 0 - - -  Injury with Fall? - 0 - - -    FALL RISK PREVENTION PERTAINING TO THE HOME:  Any stairs in or around the home? Yes  If so, are there any without handrails? No   Home free of loose throw rugs in walkways, pet beds, electrical cords, etc? Yes  Adequate lighting in your home to reduce risk of falls? Yes   ASSISTIVE DEVICES UTILIZED TO PREVENT FALLS:  Life alert? No  Use of a cane, walker or w/c? No  Grab bars in the bathroom? No  Shower chair or bench in shower? No  Elevated toilet seat or a handicapped toilet? No   DME ORDERS:  DME order needed?  No   TIMED UP AND GO:  Unable to perform    Depression Screen PHQ 2/9 Scores 05/10/2019 05/03/2019 05/01/2018 04/15/2017  PHQ - 2 Score 0 0 0 0  PHQ- 9 Score - 0 - 0     Cognitive Function     6CIT Screen 05/10/2019 05/01/2018  What Year? 0 points 0 points  What month? 0 points 0 points  What time? 0 points 0 points  Count back from 20 0 points 0 points  Months in reverse 0 points 0 points  Repeat phrase 0 points 0 points  Total Score 0 0    Immunization History  Administered Date(s) Administered  . Influenza, High Dose Seasonal PF 09/07/2018  . Influenza-Unspecified 10/20/2015, 11/03/2017  . Pneumococcal Conjugate-13 09/16/2015  . Pneumococcal Polysaccharide-23 07/03/2013  . Td 01/31/2007  . Tdap 05/01/2018  . Zoster 02/18/2009    Qualifies for Shingles Vaccine? Yes  Zostavax completed 02/18/2009. Due for Shingrix. Education has been  provided regarding the importance of this vaccine. Pt has been advised to call insurance company to determine out of pocket expense. Advised may also receive vaccine at local pharmacy or Health Dept.  Verbalized acceptance and understanding.  Tdap: up to date   Flu Vaccine: up to date   Pneumococcal Vaccine: up to date   Screening Tests Health Maintenance  Topic Date Due  . INFLUENZA VACCINE  07/21/2019  . MAMMOGRAM  07/13/2020  . COLONOSCOPY  04/12/2021  . TETANUS/TDAP  05/01/2028  . DEXA SCAN  Completed  . Hepatitis C Screening  Completed  . PNA vac Low Risk Adult  Completed    Cancer Screenings:  Colorectal Screening: Completed 04/12/2016. Repeat every 5 years due to family history   Mammogram: Completed 07/13/2018. Repeat every year  Bone Density: Completed 11/01/2017.  Lung Cancer Screening: (Low Dose CT Chest recommended if Age 54-80 years, 30 pack-year currently smoking OR have quit w/in 15years.) does not qualify.     Additional Screening:  Hepatitis C Screening: does qualify; Completed 09/16/2015  Vision Screening: Recommended annual ophthalmology exams for early detection of glaucoma and other disorders of the eye. Is the Savannah [name] up to date with their annual eye exam?  Yes  Who is the provider or what is the name of the office in which the pt attends annual eye exams? Dr.Thurmond    Dental Screening: Recommended annual dental exams for proper oral hygiene  Community Resource Referral:  CRR required this visit?  No       Plan:  I have personally reviewed and addressed the Medicare Annual Wellness questionnaire and have noted the following in the Savannah [name]'s chart:  A. Medical and social history B. Use of alcohol, tobacco or illicit drugs  C. Current medications and supplements D. Functional ability and status E.  Nutritional status F.  Physical activity G. Advance directives H. List of other physicians I.  Hospitalizations, surgeries, and ER visits in previous 12 months J.  [place] such as hearing and vision if needed, cognitive and depression L. Referrals and appointments   In addition, I have reviewed and discussed with Savannah [name]  certain preventive protocols, quality metrics, and best practice recommendations. A written personalized care plan for preventive services as well as general preventive health recommendations were provided to Savannah [name].   Signed,    Bevelyn Ngo, LPN  0/12/7492 Nurse Health Advisor   Notes:none

## 2019-06-05 ENCOUNTER — Other Ambulatory Visit: Payer: Medicare Other

## 2019-06-05 ENCOUNTER — Other Ambulatory Visit: Payer: Self-pay

## 2019-06-05 ENCOUNTER — Other Ambulatory Visit: Payer: Self-pay | Admitting: Family Medicine

## 2019-06-05 DIAGNOSIS — E7800 Pure hypercholesterolemia, unspecified: Secondary | ICD-10-CM

## 2019-06-05 DIAGNOSIS — R350 Frequency of micturition: Secondary | ICD-10-CM

## 2019-06-06 ENCOUNTER — Other Ambulatory Visit: Payer: Self-pay | Admitting: Family Medicine

## 2019-06-06 LAB — LIPID PANEL W/O CHOL/HDL RATIO
Cholesterol, Total: 178 mg/dL (ref 100–199)
HDL: 74 mg/dL (ref >39)
LDL Calculated: 89 mg/dL (ref 0–99)
Triglycerides: 76 mg/dL (ref 0–149)
VLDL Cholesterol Cal: 15 mg/dL (ref 5–40)

## 2019-06-06 MED ORDER — SULFAMETHOXAZOLE-TRIMETHOPRIM 800-160 MG PO TABS: 1.0000 | ORAL_TABLET | Freq: Two times a day (BID) | ORAL | 0 refills | Status: DC

## 2019-06-08 LAB — UA/M W/RFLX CULTURE, ROUTINE
Bilirubin, UA: NEGATIVE
Glucose, UA: NEGATIVE
Ketones, UA: NEGATIVE
Nitrite, UA: NEGATIVE
Protein,UA: NEGATIVE
Specific Gravity, UA: 1.015 (ref 1.005–1.030)
Urobilinogen, Ur: 0.2 mg/dL (ref 0.2–1.0)
pH, UA: 6 (ref 5.0–7.5)

## 2019-06-08 LAB — MICROSCOPIC EXAMINATION

## 2019-06-08 LAB — URINE CULTURE, REFLEX

## 2019-07-20 ENCOUNTER — Ambulatory Visit
Admission: RE | Admit: 2019-07-20 | Discharge: 2019-07-20 | Disposition: A | Discharge status: Home / Self Care | Payer: Medicare Other | Source: Ambulatory Visit | Attending: Family Medicine | Admitting: Family Medicine

## 2019-07-20 DIAGNOSIS — Z1231 Encounter for screening mammogram for malignant neoplasm of breast: Secondary | ICD-10-CM | POA: Insufficient documentation

## 2019-07-20 DIAGNOSIS — Z1239 Encounter for other screening for malignant neoplasm of breast: Secondary | ICD-10-CM

## 2019-08-03 ENCOUNTER — Encounter: Payer: Self-pay | Admitting: Obstetrics and Gynecology

## 2019-08-03 ENCOUNTER — Ambulatory Visit (INDEPENDENT_AMBULATORY_CARE_PROVIDER_SITE_OTHER): Payer: Medicare Other | Admitting: Obstetrics and Gynecology

## 2019-08-03 ENCOUNTER — Other Ambulatory Visit: Payer: Self-pay

## 2019-08-03 VITALS — BP 122/78 | Ht 66.0 in | Wt 150.0 lb

## 2019-08-03 DIAGNOSIS — N763 Subacute and chronic vulvitis: Secondary | ICD-10-CM

## 2019-08-03 MED ORDER — TRIAMCINOLONE ACETONIDE 0.1 % EX OINT: 1.0000 "application " | TOPICAL_OINTMENT | Freq: Two times a day (BID) | CUTANEOUS | 0 refills | Status: DC

## 2019-08-03 NOTE — Progress Notes (Signed)
Obstetrics & Gynecology Office Visit   Chief Complaint  Patient presents with  . Vaginal Itching   History of Present Illness: 73 y.o. Female with a history of chronic vaginitis. She was last seen in clinic in April where a biopsy was taken that showed chronic inflammation.  She states that she took the medication and her symptoms improved greatly. She has continued to use vasoline to treat intermittent symptoms. However, she finds her symptoms are more often present at night and that she has to use the vasoline nightly.  She has not identified a causative agent that is leading to her reaction.  She denies vaginal bleeding and any new sore spots.   Past Medical History:  Diagnosis Date  . Anxiety   . Cancer (HCC)    skin- squamous  . HOH (hard of hearing)    WEARS AIDS  . Hyperlipidemia   . Hypertension    CONTROLLED ON MEDS  . Hypothyroidism   . IBS (irritable bowel syndrome)   . MVP (mitral valve prolapse)   . OA (osteoarthritis) of neck    JOINTS  . Osteoporosis   . Thyroid disease     Past Surgical History:  Procedure Laterality Date  . ANKLE FRACTURE SURGERY Left   . BREAST CYST ASPIRATION Right 90s  . COLONOSCOPY WITH PROPOFOL N/A 04/12/2016   Procedure: COLONOSCOPY WITH PROPOFOL;  Surgeon: Manya Silvas, MD;  Location: [facility] ENDOSCOPY;  Service: Endoscopy;  Laterality: N/A;  . FRACTURE SURGERY     ANKLE  . HAMMER TOE SURGERY Right 02/08/2018   Procedure: HAMMER TOE CORRECTION-5TH TOE, Excision soft tissue mass dorsal lateral right fifth toe and exostectomy dorsal lateral DIPJ fifth toe;  Surgeon: Samara Deist, DPM;  Location: [place];  Service: Podiatry;  Laterality: Right;  IVA LOCAL  . moses surgery  09/2015  . SINUS SURGERY WITH INSTATRAK  10/2017   DEVIATED SEPTUM  . 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 Patient is postmenopausal.  Obstetric History: No obstetric history on  file.  Family History  Problem Relation Age of Onset  . Cancer Mother        colon  . Hypertension Mother   . Cancer Father        lung  . Hyperlipidemia Brother   . Breast cancer Neg Hx     Social History   Socioeconomic History  . Marital status: Married    Spouse name: Not on file  . Number of children: Not on file  . Years of education: Not on file  . Highest education level: Master's degree (e.g., MA, MS, MEng, MEd, MSW, MBA)  Occupational History  . Occupation: retired  Scientific laboratory technician  . Financial resource strain: Not hard at all  . Food insecurity    Worry: Never true    Inability: Never true  . Transportation needs    Medical: No    Non-medical: No  Tobacco Use  . Smoking status: Former Smoker    Packs/day: 1.00    Years: 4.00    Pack years: 4.00    Types: Cigarettes    Quit date: 1970    Years since quitting: 50.6  . Smokeless tobacco: Never Used  Substance and Sexual Activity  . Alcohol use: Yes    Alcohol/week: 10.0 standard drinks    Types: 10 Glasses of wine per week  . Drug use: No  . Sexual activity: Yes  Lifestyle  .  Physical activity    Days per week: 7 days    Minutes per session: 40 min  . Stress: Not at all  Relationships  . Social connections    Talks on phone: More than three times a week    Gets together: More than three times a week    Attends religious service: Never    Active member of club or organization: No    Attends meetings of clubs or organizations: Never    Relationship status: Married  . Intimate partner violence    Fear of current or ex partner: No    Emotionally abused: No    Physically abused: No    Forced sexual activity: No  Other Topics Concern  . Not on file  Social History Narrative  . Not on file    Allergies  Allergen Reactions  . Amoxil [Amoxicillin] Itching  . Penicillins Other (See Comments)    "passed out"    Prior to Admission medications   Medication Sig Start Date End Date Taking?  Authorizing Provider  ALPRAZolam (XANAX) 0.25 MG tablet Take 1 tablet (0.25 mg total) by mouth at bedtime as needed for anxiety. 05/03/19  Yes Volney American, PA-C  atorvastatin (LIPITOR) 20 MG tablet Take 1 tablet (20 mg total) by mouth daily. AM 05/03/19  Yes Volney American, PA-C  esomeprazole (NEXIUM) 40 MG capsule Take by mouth. 04/10/19  Yes [provider]  fluticasone (FLONASE) 50 MCG/ACT nasal spray Place 2 sprays into both nostrils daily. Patient taking differently: Place 1 spray into both nostrils daily.  09/13/17  Yes Kathrine Haddock, NP  levothyroxine (SYNTHROID) 75 MCG tablet Take 1 tablet (75 mcg total) by mouth daily before breakfast. 05/03/19  Yes Volney American, PA-C  metoprolol succinate (TOPROL-XL) 50 MG 24 hr tablet Take 1/2 tablet (25 mg) daily with or immediately following a meal. 05/03/19  Yes Volney American, PA-C  ibandronate (BONIVA) 150 MG tablet Take in the morning with a full glass of water, on an empty stomach, and do not take anything else by mouth or lie down for the next 30 min. Patient not taking: Reported on 08/03/2019 05/03/19   Volney American, PA-C  scopolamine (TRANSDERM-SCOP, 1.5 MG,) 1 MG/3DAYS Place 1 patch (1.5 mg total) onto the skin every 3 (three) days. Patient not taking: Reported on 08/03/2019 05/03/19   Volney American, PA-C  sulfamethoxazole-trimethoprim (BACTRIM DS) 800-160 MG tablet Take 1 tablet by mouth 2 (two) times daily. Patient not taking: Reported on 08/03/2019 06/06/19   Volney American, PA-C    Review of Systems  Constitutional: Negative.   HENT: Negative.   Eyes: Negative.   Respiratory: Negative.   Cardiovascular: Negative.   Gastrointestinal: Negative.   Genitourinary: Negative.        See HPI  Musculoskeletal: Negative.   Skin: Negative.   Neurological: Negative.   Psychiatric/Behavioral: Negative.      Physical Exam BP 122/78   Ht 5\' 6"  (1.676 m)   Wt 150 lb (68 kg)    LMP  (LMP Unknown)   BMI 24.21 kg/m  No LMP recorded (lmp unknown). Patient is postmenopausal. Physical Exam Constitutional:      General: She is not in acute distress.    Appearance: Normal appearance.  Genitourinary:     Pelvic exam was performed with patient in the lithotomy position.     HENT:     Head: Normocephalic and atraumatic.  Eyes:     General: No scleral icterus.  Conjunctiva/sclera: Conjunctivae normal.  Neurological:     General: No focal deficit present.     Mental Status: She is alert and oriented to person, place, and time.     Cranial Nerves: No cranial nerve deficit.  Psychiatric:        Mood and Affect: Mood normal.        Behavior: Behavior normal.        Judgment: Judgment normal.     Female chaperone present for pelvic and breast  portions of the physical exam  Assessment: 73 y.o. No obstetric history on file. female here for  1. Chronic vulvitis      Plan: Problem List Items Addressed This Visit    None    Visit Diagnoses    Chronic vulvitis    -  Primary   Relevant Medications   triamcinolone ointment (KENALOG) 0.1 %     Refill steroid.  After taper, use once weekly with vaseline prn  Keep follow up for annual in about six months.    Prentice Docker, MD 08/03/2019 12:41 PM

## 2019-09-13 DIAGNOSIS — R197 Diarrhea, unspecified: Secondary | ICD-10-CM | POA: Insufficient documentation

## 2019-09-13 DIAGNOSIS — G8929 Other chronic pain: Secondary | ICD-10-CM | POA: Insufficient documentation

## 2019-09-24 ENCOUNTER — Other Ambulatory Visit: Payer: Self-pay

## 2019-09-24 ENCOUNTER — Encounter: Payer: Self-pay | Admitting: Family Medicine

## 2019-09-24 ENCOUNTER — Ambulatory Visit: Payer: Medicare Other | Admitting: Family Medicine

## 2019-09-24 VITALS — BP 124/76 | HR 67 | Temp 97.9°F | Ht 66.0 in | Wt 145.0 lb

## 2019-09-24 DIAGNOSIS — Z809 Family history of malignant neoplasm, unspecified: Secondary | ICD-10-CM | POA: Diagnosis not present

## 2019-09-24 DIAGNOSIS — R109 Unspecified abdominal pain: Secondary | ICD-10-CM

## 2019-09-24 LAB — MICROSCOPIC EXAMINATION
Bacteria, UA: NONE SEEN
WBC, UA: NONE SEEN /hpf (ref 0–5)

## 2019-09-24 LAB — UA/M W/RFLX CULTURE, ROUTINE
Bilirubin, UA: NEGATIVE
Glucose, UA: NEGATIVE
Ketones, UA: NEGATIVE
Leukocytes,UA: NEGATIVE
Nitrite, UA: NEGATIVE
Protein,UA: NEGATIVE
Specific Gravity, UA: <1.005 — ABNORMAL LOW (ref 1.005–1.030)
Urobilinogen, Ur: 0.2 mg/dL (ref 0.2–1.0)
pH, UA: 5 (ref 5.0–7.5)

## 2019-09-24 NOTE — Progress Notes (Signed)
BP 124/76 (BP Location: Left Arm, Patient Position: Sitting, Cuff Size: Normal)   Pulse 67   Temp 97.9 F (36.6 C) (Oral)   Ht 5\' 6"  (1.676 m)   Wt 145 lb (65.8 kg)   LMP  (LMP Unknown)   SpO2 98%   BMI 23.40 kg/m    Subjective:    Patient ID: Savannah [name], female    DOB: December 25, 1945, 73 y.o.   MRN: [id number]  HPI: Savannah [name] is a 73 y.o. female  Chief Complaint  Patient presents with  . Flank Pain    Patient saw gastroenterologist. Had urine done and patient states suspicion for UTI. Sherrill Raring put patient on Cipro. Patient states she's in pain every day. Requesting U/S of Kidney. Right side.  . Urinary Tract Infection   Right mid back pain, localized, daily ongoing for months. Tylenol does ease it off temporarily. Movement seems to exacerbate it, sitting still helps. Following with GI currently for diarrhea daily to work that up and currently being treated with cipro through GI for possible UTI and bacterial stool infection. Denies weight loss, night sweats, anorexia, fatigue. No hematuria, hx of kidney stones, fevers, chills, sweats, anorexia, abnormal weight loss, fatigue. Feels well overall otherwise.   Mother dx'd with colon cancer at 40, brother died 80 years ago at 67 years old of gastric cancer and father died in 39 of some sort of cancer. Very concerned about her cancer risks especially with the current sxs she's been dealing with.   Relevant past medical, surgical, family and social history reviewed and updated as indicated. Interim medical history since our last visit reviewed. Allergies and medications reviewed and updated.  Review of Systems  Per HPI unless specifically indicated above     Objective:    BP 124/76 (BP Location: Left Arm, Patient Position: Sitting, Cuff Size: Normal)   Pulse 67   Temp 97.9 F (36.6 C) (Oral)   Ht 5\' 6"  (1.676 m)   Wt 145 lb (65.8 kg)   LMP  (LMP Unknown)   SpO2 98%   BMI 23.40 kg/m   Wt Readings from Last 3 Encounters:   09/24/19 145 lb (65.8 kg)  08/03/19 150 lb (68 kg)  05/10/19 148 lb (67.1 kg)    Physical Exam Vitals signs and nursing note reviewed.  Constitutional:      Appearance: Normal appearance. She is not ill-appearing.  HENT:     Head: Atraumatic.  Eyes:     Extraocular Movements: Extraocular movements intact.     Conjunctiva/sclera: Conjunctivae normal.  Neck:     Musculoskeletal: Normal range of motion and neck supple.  Cardiovascular:     Rate and Rhythm: Normal rate and regular rhythm.     Heart sounds: Normal heart sounds.  Pulmonary:     Effort: Pulmonary effort is normal.     Breath sounds: Normal breath sounds.  Abdominal:     General: Bowel sounds are normal. There is no distension.     Palpations: Abdomen is soft. There is no mass.     Tenderness: There is no abdominal tenderness. There is no right CVA tenderness, left CVA tenderness or guarding.  Musculoskeletal: Normal range of motion.        General: No tenderness or deformity.  Skin:    General: Skin is warm and dry.  Neurological:     Mental Status: She is alert and oriented to person, place, and time.  Psychiatric:        Mood  and Affect: Mood normal.        Thought Content: Thought content normal.        Judgment: Judgment normal.     Results for orders placed or performed in visit on 09/24/19  Microscopic Examination   URINE  Result Value Ref Range   WBC, UA None seen 0 - 5 /hpf   RBC 0-2 0 - 2 /hpf   Epithelial Cells (non renal) 0-10 0 - 10 /hpf   Bacteria, UA None seen None seen/Few  UA/M w/rflx Culture, Routine   Specimen: Urine   URINE  Result Value Ref Range   Specific Gravity, UA <1.005 (L) 1.005 - 1.030   pH, UA 5.0 5.0 - 7.5   Color, UA Yellow Yellow   Appearance Ur Clear Clear   Leukocytes,UA Negative Negative   Protein,UA Negative Negative/Trace   Glucose, UA Negative Negative   Ketones, UA Negative Negative   RBC, UA Trace (A) Negative   Bilirubin, UA Negative Negative    Urobilinogen, Ur 0.2 0.2 - 1.0 mg/dL   Nitrite, UA Negative Negative   Microscopic Examination See below:       Assessment & Plan:   Problem List Items Addressed This Visit    None    Visit Diagnoses    Right flank pain    -  Primary   Will obtain u/s, continue following with GI and complete course of cipro given. Monitor closely for sxs changing or worsening in meantime   Relevant Orders   UA/M w/rflx Culture, Routine (Completed)   US Abdomen Complete   Family history of cancer       Will refer for genetic counseling given patient concern and fhx   Relevant Orders   Ambulatory referral to Genetics    25 minutes spent today in direct care and counseling with patient   Follow up plan: Return if symptoms worsen or fail to improve.

## 2019-10-01 ENCOUNTER — Encounter: Payer: Self-pay | Admitting: Emergency Medicine

## 2019-10-01 ENCOUNTER — Emergency Department
Admission: EM | Admit: 2019-10-01 | Discharge: 2019-10-01 | Disposition: A | Discharge status: Home / Self Care | Payer: Medicare Other | Attending: Student in an Organized Health Care Education/Training Program | Admitting: Student in an Organized Health Care Education/Training Program

## 2019-10-01 ENCOUNTER — Other Ambulatory Visit: Payer: Self-pay

## 2019-10-01 ENCOUNTER — Emergency Department: Payer: Medicare Other

## 2019-10-01 DIAGNOSIS — E039 Hypothyroidism, unspecified: Secondary | ICD-10-CM | POA: Insufficient documentation

## 2019-10-01 DIAGNOSIS — Z87891 Personal history of nicotine dependence: Secondary | ICD-10-CM | POA: Insufficient documentation

## 2019-10-01 DIAGNOSIS — I10 Essential (primary) hypertension: Secondary | ICD-10-CM | POA: Diagnosis not present

## 2019-10-01 DIAGNOSIS — Z85828 Personal history of other malignant neoplasm of skin: Secondary | ICD-10-CM | POA: Insufficient documentation

## 2019-10-01 DIAGNOSIS — Z79899 Other long term (current) drug therapy: Secondary | ICD-10-CM | POA: Diagnosis not present

## 2019-10-01 DIAGNOSIS — R109 Unspecified abdominal pain: Secondary | ICD-10-CM

## 2019-10-01 DIAGNOSIS — R1031 Right lower quadrant pain: Secondary | ICD-10-CM | POA: Insufficient documentation

## 2019-10-01 LAB — URINALYSIS, COMPLETE (UACMP) WITH MICROSCOPIC
Bacteria, UA: NONE SEEN
Bilirubin Urine: NEGATIVE
Glucose, UA: NEGATIVE mg/dL
Hgb urine dipstick: NEGATIVE
Ketones, ur: NEGATIVE mg/dL
Leukocytes,Ua: NEGATIVE
Nitrite: NEGATIVE
Protein, ur: NEGATIVE mg/dL
Specific Gravity, Urine: >1.046 — ABNORMAL HIGH (ref 1.005–1.030)
Squamous Epithelial / HPF: NONE SEEN (ref 0–5)
WBC, UA: NONE SEEN WBC/hpf (ref 0–5)
pH: 5 (ref 5.0–8.0)

## 2019-10-01 LAB — CBC
HCT: 43.1 % (ref 36.0–46.0)
Hemoglobin: 14.2 g/dL (ref 12.0–15.0)
MCH: 31.6 pg (ref 26.0–34.0)
MCHC: 32.9 g/dL (ref 30.0–36.0)
MCV: 95.8 fL (ref 80.0–100.0)
Platelets: 215 10*3/uL (ref 150–400)
RBC: 4.5 MIL/uL (ref 3.87–5.11)
RDW: 12.2 % (ref 11.5–15.5)
WBC: 5.4 10*3/uL (ref 4.0–10.5)
nRBC: 0 % (ref 0.0–0.2)

## 2019-10-01 LAB — COMPREHENSIVE METABOLIC PANEL
ALT: 14 U/L (ref 0–44)
AST: 15 U/L (ref 15–41)
Albumin: 4.2 g/dL (ref 3.5–5.0)
Alkaline Phosphatase: 54 U/L (ref 38–126)
Anion gap: 6 (ref 5–15)
BUN: 13 mg/dL (ref 8–23)
CO2: 27 mmol/L (ref 22–32)
Calcium: 9 mg/dL (ref 8.9–10.3)
Chloride: 106 mmol/L (ref 98–111)
Creatinine, Ser: 0.95 mg/dL (ref 0.44–1.00)
GFR calc Af Amer: >60 mL/min (ref >60)
GFR calc non Af Amer: 59 mL/min — ABNORMAL LOW (ref >60)
Glucose, Bld: 128 mg/dL — ABNORMAL HIGH (ref 70–99)
Potassium: 3.7 mmol/L (ref 3.5–5.1)
Sodium: 139 mmol/L (ref 135–145)
Total Bilirubin: 0.6 mg/dL (ref 0.3–1.2)
Total Protein: 6.7 g/dL (ref 6.5–8.1)

## 2019-10-01 LAB — LIPASE, BLOOD: Lipase: 19 U/L (ref 11–51)

## 2019-10-01 MED ORDER — SODIUM CHLORIDE 0.9 % IV BOLUS: 500.0000 mL | Freq: Once | INTRAVENOUS | Status: DC

## 2019-10-01 MED ORDER — OXYCODONE HCL 5 MG PO TABS
5.0000 mg | ORAL_TABLET | ORAL | Status: DC | PRN
  Administered 2019-10-01: 5 mg via ORAL
  Filled 2019-10-01: qty 1

## 2019-10-01 MED ORDER — IOHEXOL 350 MG/ML SOLN
100.0000 mL | Freq: Once | INTRAVENOUS | Status: AC | PRN
  Administered 2019-10-01: 100 mL via INTRAVENOUS

## 2019-10-01 MED ORDER — HYDROCODONE-ACETAMINOPHEN 5-325 MG PO TABS: 1.0000 | ORAL_TABLET | ORAL | 0 refills | Status: DC | PRN

## 2019-10-01 NOTE — ED Triage Notes (Signed)
First Nurse Note: C/O right lower back pain x 3 weeks.  States has seen PCP and GI for complaints and has an outpatient [place] appointment scheduled for Wednesday this week of kidney, liver and pancreas.    Patient is AAOx3.  Skin warm and dry.  NAD.  Ambulates with easy and steady gait.

## 2019-10-01 NOTE — ED Provider Notes (Signed)
[facility] Emergency Department Provider Note    First MD Initiated Contact with Patient 10/01/19 1754     (approximate)  I have reviewed the triage vital signs and the nursing notes.   HISTORY  Chief Complaint Back Pain and Abdominal Pain    HPI Savannah [name] is a 73 y.o. female close past medical history presents the ER for evaluation of 3 weeks of progressively worsening right flank pain.  States that she is been seen by her PCP as well as GI doctor for similar symptoms and is scheduled for outpatient ultrasound and colonoscopy but pain is becoming more severe.  States she did have some shortness of breath with this today.  Did have recent travel out to [place] where she was hunting.  Denies any injury then.  Denies any fevers.  No cough.  Is never had pain like this before.    Past Medical History:  Diagnosis Date  . Anxiety   . Cancer (HCC)    skin- squamous  . HOH (hard of hearing)    WEARS AIDS  . Hyperlipidemia   . Hypertension    CONTROLLED ON MEDS  . Hypothyroidism   . IBS (irritable bowel syndrome)   . MVP (mitral valve prolapse)   . OA (osteoarthritis) of neck    JOINTS  . Osteoporosis   . Thyroid disease    Family History  Problem Relation Age of Onset  . Cancer Mother        colon  . Hypertension Mother   . Cancer Father        lung  . Hyperlipidemia Brother   . Breast cancer Neg Hx    Past Surgical History:  Procedure Laterality Date  . ANKLE FRACTURE SURGERY Left   . BREAST CYST ASPIRATION Right 90s  . COLONOSCOPY WITH PROPOFOL N/A 04/12/2016   Procedure: COLONOSCOPY WITH PROPOFOL;  Surgeon: Manya Silvas, MD;  Location: [facility] ENDOSCOPY;  Service: Endoscopy;  Laterality: N/A;  . FRACTURE SURGERY     ANKLE  . HAMMER TOE SURGERY Right 02/08/2018   Procedure: HAMMER TOE CORRECTION-5TH TOE, Excision soft tissue mass dorsal lateral right fifth toe and exostectomy dorsal lateral DIPJ fifth toe;  Surgeon: Samara Deist, DPM;   Location: [place];  Service: Podiatry;  Laterality: Right;  IVA LOCAL  . moses surgery  09/2015  . SINUS SURGERY WITH INSTATRAK  10/2017   DEVIATED SEPTUM  . 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 Right knee pain 05/01/2018  . Advance care planning 04/15/2017  . Family history of colon cancer 04/13/2016  . Chronic anxiety 04/13/2016  . Hypothyroidism 09/15/2015  . MVP (mitral valve prolapse) 09/15/2015  . Osteoporosis 09/15/2015  . OA (osteoarthritis) of neck 09/15/2015  . Hyperlipidemia 09/15/2015  . Essential hypertension 09/15/2015  . Migraines 09/15/2015      Prior to Admission medications   Medication Sig Start Date End Date Taking? Authorizing Provider  ALPRAZolam (XANAX) 0.25 MG tablet Take 1 tablet (0.25 mg total) by mouth at bedtime as needed for anxiety. 05/03/19   Volney American, PA-C  atorvastatin (LIPITOR) 20 MG tablet Take 1 tablet (20 mg total) by mouth daily. AM 05/03/19   Volney American, PA-C  esomeprazole (NEXIUM) 40 MG capsule Take by mouth. 04/10/19   [provider]  fluticasone (FLONASE) 50 MCG/ACT nasal spray Place 2 sprays into both nostrils daily. Patient taking  differently: Place 1 spray into both nostrils daily.  09/13/17   Kathrine Haddock, NP  ibandronate (BONIVA) 150 MG tablet Take in the morning with a full glass of water, on an empty stomach, and do not take anything else by mouth or lie down for the next 30 min. 05/03/19   Volney American, PA-C  levothyroxine (SYNTHROID) 75 MCG tablet Take 1 tablet (75 mcg total) by mouth daily before breakfast. 05/03/19   Volney American, PA-C  metoprolol succinate (TOPROL-XL) 50 MG 24 hr tablet Take 1/2 tablet (25 mg) daily with or immediately following a meal. 05/03/19   Volney American, PA-C  scopolamine (TRANSDERM-SCOP, 1.5 MG,) 1 MG/3DAYS Place 1 patch (1.5 mg total) onto the skin every 3 (three)  days. Patient not taking: Reported on 08/03/2019 05/03/19   Volney American, PA-C    Allergies Amoxil [amoxicillin] and Penicillins    Social History Social History   Tobacco Use  . Smoking status: Former Smoker    Packs/day: 1.00    Years: 4.00    Pack years: 4.00    Types: Cigarettes    Quit date: 1970    Years since quitting: 50.8  . Smokeless tobacco: Never Used  Substance Use Topics  . Alcohol use: Yes    Alcohol/week: 10.0 standard drinks    Types: 10 Glasses of wine per week  . Drug use: No    Review of Systems Patient denies headaches, rhinorrhea, blurry vision, numbness, shortness of breath, chest pain, edema, cough, abdominal pain, nausea, vomiting, diarrhea, dysuria, fevers, rashes or hallucinations unless otherwise stated above in HPI. ____________________________________________   PHYSICAL EXAM:  VITAL SIGNS: Vitals:   10/01/19 1624 10/01/19 2057  BP: (!) 179/95 (!) 171/82  Pulse: 82 (!) 58  Resp: 18 15  Temp: 97.8 F (36.6 C)   SpO2: 97% 98%    Constitutional: Alert and oriented.  Eyes: Conjunctivae are normal.  Head: Atraumatic. Nose: No congestion/rhinnorhea. Mouth/Throat: Mucous membranes are moist.   Neck: No stridor. Painless ROM.  Cardiovascular: Normal rate, regular rhythm. Grossly normal heart sounds.  Good peripheral circulation. Respiratory: Normal respiratory effort.  No retractions. Lungs CTAB. Gastrointestinal: Soft and nontender. No distention. No abdominal bruits. No CVA tenderness. Genitourinary:  Musculoskeletal: No lower extremity tenderness nor edema.  No joint effusions. Neurologic:  Normal speech and language. No gross focal neurologic deficits are appreciated. No facial droop Skin:  Skin is warm, dry and intact. No rash noted. Psychiatric: Mood and affect are normal. Speech and behavior are normal.  ____________________________________________   LABS (all labs ordered are listed, but only abnormal results are  displayed)  Results for orders placed or performed during the hospital encounter of 10/01/19 (from the past 24 hour(s))  Lipase, blood     Status: None   Collection Time: 10/01/19  4:27 PM  Result Value Ref Range   Lipase 19 11 - 51 U/L  Comprehensive metabolic panel     Status: Abnormal   Collection Time: 10/01/19  4:27 PM  Result Value Ref Range   Sodium 139 135 - 145 mmol/L   Potassium 3.7 3.5 - 5.1 mmol/L   Chloride 106 98 - 111 mmol/L   CO2 27 22 - 32 mmol/L   Glucose, Bld 128 (H) 70 - 99 mg/dL   BUN 13 8 - 23 mg/dL   Creatinine, Ser 0.95 0.44 - 1.00 mg/dL   Calcium 9.0 8.9 - 10.3 mg/dL   Total Protein 6.7 6.5 - 8.1 g/dL   Albumin  4.2 3.5 - 5.0 g/dL   AST 15 15 - 41 U/L   ALT 14 0 - 44 U/L   Alkaline Phosphatase 54 38 - 126 U/L   Total Bilirubin 0.6 0.3 - 1.2 mg/dL   GFR calc non Af Amer 59 (L) >60 mL/min   GFR calc Af Amer >60 >60 mL/min   Anion gap 6 5 - 15  CBC     Status: None   Collection Time: 10/01/19  4:27 PM  Result Value Ref Range   WBC 5.4 4.0 - 10.5 K/uL   RBC 4.50 3.87 - 5.11 MIL/uL   Hemoglobin 14.2 12.0 - 15.0 g/dL   HCT 43.1 36.0 - 46.0 %   MCV 95.8 80.0 - 100.0 fL   MCH 31.6 26.0 - 34.0 pg   MCHC 32.9 30.0 - 36.0 g/dL   RDW 12.2 11.5 - 15.5 %   Platelets 215 150 - 400 K/uL   nRBC 0.0 0.0 - 0.2 %  Urinalysis, Complete w Microscopic     Status: Abnormal   Collection Time: 10/01/19  8:35 PM  Result Value Ref Range   Color, Urine COLORLESS (A) YELLOW   APPearance CLEAR (A) CLEAR   Specific Gravity, Urine >1.046 (H) 1.005 - 1.030   pH 5.0 5.0 - 8.0   Glucose, UA NEGATIVE NEGATIVE mg/dL   Hgb urine dipstick NEGATIVE NEGATIVE   Bilirubin Urine NEGATIVE NEGATIVE   Ketones, ur NEGATIVE NEGATIVE mg/dL   Protein, ur NEGATIVE NEGATIVE mg/dL   Nitrite NEGATIVE NEGATIVE   Leukocytes,Ua NEGATIVE NEGATIVE   WBC, UA NONE SEEN 0 - 5 WBC/hpf   Bacteria, UA NONE SEEN NONE SEEN   Squamous Epithelial / LPF NONE SEEN 0 - 5    ____________________________________________  EKG My review and personal interpretation at Time: 20:46   Indication: flank pain  Rate: 65  Rhythm: sinus Axis: normal Other: normal intervals, no stemi ____________________________________________  RADIOLOGY  I personally reviewed all radiographic images ordered to evaluate for the above acute complaints and reviewed radiology reports and findings.  These findings were personally discussed with the patient.  Please see medical record for radiology report.  ____________________________________________   PROCEDURES  Procedure(s) performed:  Procedures    Critical Care performed: no ____________________________________________   INITIAL IMPRESSION / ASSESSMENT AND PLAN / ED COURSE  Pertinent labs & imaging results that were available during my care of the patient were reviewed by me and considered in my medical decision making (see chart for details).   DDX: stone, msk strain, colitis, pancreatitis, biliary pathology, pyelo, pe, pna  Savannah [name] is a 73 y.o. who presents to the ED with symptoms as described above.  Having right flank pain.  Difficult to differentiate between pulmonary and intra-abdominal process.  Blood work is reassuring.  Does not seem consistent with ACS.  Will order CT imaging provide pain medication and reassess.  Clinical Course as of Sep 30 2146  Mon Oct 01, 2019  2143 Patient reassessed.  Well-appearing.  [place] is very reassuring work-up here.  CT imaging without acute pathology.  Not c/w pe, dissection, acs, pna.  Likely musculoskeletal strain.  Felt significant improvement after pain medication.  At this point do believe she stable and appropriate for outpatient follow-up.   [PR]    Clinical Course User Index [PR] Merlyn Lot, MD    The patient was evaluated in Emergency Department today for the symptoms described in the history of present illness. He/she was evaluated in the context of the  global COVID-19 pandemic, which necessitated consideration that the patient might be at risk for infection with the SARS-CoV-2 virus that causes COVID-19. Institutional protocols and algorithms that pertain to the evaluation of patients at risk for COVID-19 are in a state of rapid change based on information released by regulatory bodies including the CDC and federal and state organizations. These policies and algorithms were followed during the patient's care in the ED.  As part of my medical decision making, I reviewed the following data within the [place] notes reviewed and incorporated, Labs reviewed, notes from prior ED visits and [place] Controlled Substance Database   ____________________________________________   FINAL CLINICAL IMPRESSION(S) / ED DIAGNOSES  Final diagnoses:  Acute right flank pain      NEW MEDICATIONS STARTED DURING THIS VISIT:  New Prescriptions   No medications on file     Note:  This document was prepared using Dragon voice recognition software and may include unintentional dictation errors.    Merlyn Lot, MD 10/01/19 2147

## 2019-10-01 NOTE — ED Triage Notes (Addendum)
Pt arrives with complaints of mid/lower back pain that started 3 weeks prior. Pt reports she has a [place] scheduled for Wednesday but is unable to handle the pain at home. No known injury.

## 2019-10-02 ENCOUNTER — Telehealth: Payer: Self-pay | Admitting: Family Medicine

## 2019-10-02 NOTE — Telephone Encounter (Signed)
I did want complete to look at surrounding structures, however please see if patient still wants to go forward with imaging as per review she went to ED yesterday and had full CT abdomen which was negative. The u/s is somewhat redundant based on this

## 2019-10-02 NOTE — Telephone Encounter (Signed)
Called and left a message for patient to return my call to see if she wants to still do the ultrasound.

## 2019-10-02 NOTE — Telephone Encounter (Signed)
Routing to provider to advise.  

## 2019-10-02 NOTE — Telephone Encounter (Signed)
Savannah [name], with [facility] ultrasound department, calling to inquire if the order for "abdomen complete" for right flank pain is correct or if PCP wanted them to just look at kidneys. Patient is scheduled for ultrasound 10/03/2019 at 9:30.

## 2019-10-03 ENCOUNTER — Ambulatory Visit: Payer: Medicare Other

## 2019-10-03 NOTE — Telephone Encounter (Signed)
Noted, thank you

## 2019-10-03 NOTE — Telephone Encounter (Signed)
Called and spoke to patient. She states she called and cancelled the [place] yesterday.  Patient states she is in a lot more pain than she was on Monday. States she had a full work up at the ER. States she was given hydrocodone, did not take it yesterday but she did take it this morning. She states she does not know what to do. She states she is hoping the hydrocodone will help. States she just wanted to make Apolonio Schneiders aware and will call back if she is still having pain or issues after taking the hydrocodone.

## 2019-10-04 ENCOUNTER — Telehealth: Payer: Self-pay | Admitting: Family Medicine

## 2019-10-04 NOTE — Telephone Encounter (Signed)
Copied from [place] [id number]. Topic: General - Other >> Oct 04, 2019 11:58 AM Ivar Drape wrote: Reason for CRM: Patient went to [facility] ER on Monday for back pain.  She was given 6 Hydrocodone tablets but her pain is worse. The medication helps a little.  She would like to know where to go from here. >> Oct 04, 2019  2:12 PM Jill Side wrote: Called pt to schedule appt, no answer, no vm

## 2019-10-04 NOTE — Telephone Encounter (Signed)
Patient went to [facility] ER on Monday for back pain. She was given 6 Hydrocodone tablets but her pain is worse. The medication helps a little. She would like to know where to go from here.

## 2019-10-08 ENCOUNTER — Encounter: Payer: Self-pay | Admitting: Family Medicine

## 2019-10-08 ENCOUNTER — Other Ambulatory Visit: Payer: Self-pay

## 2019-10-08 ENCOUNTER — Ambulatory Visit
Admission: RE | Admit: 2019-10-08 | Discharge: 2019-10-08 | Disposition: A | Discharge status: Home / Self Care | Payer: Medicare Other | Source: Ambulatory Visit | Attending: Family Medicine | Admitting: Family Medicine

## 2019-10-08 ENCOUNTER — Ambulatory Visit: Payer: Medicare Other | Admitting: Family Medicine

## 2019-10-08 VITALS — BP 159/70 | HR 61 | Temp 98.9°F

## 2019-10-08 DIAGNOSIS — R109 Unspecified abdominal pain: Secondary | ICD-10-CM

## 2019-10-08 MED ORDER — NAPROXEN 500 MG PO TABS: 500.0000 mg | ORAL_TABLET | Freq: Two times a day (BID) | ORAL | 0 refills | Status: DC

## 2019-10-08 MED ORDER — CYCLOBENZAPRINE HCL 10 MG PO TABS: 10.0000 mg | ORAL_TABLET | Freq: Three times a day (TID) | ORAL | 0 refills | Status: DC | PRN

## 2019-10-08 NOTE — Telephone Encounter (Signed)
Patient called in stating she is unhappy no one has called her and she is going to the ED since she is having some more pain and is not confident in service office that the is providing.

## 2019-10-08 NOTE — Telephone Encounter (Signed)
Spoke with pt and she stated that she was unhappy because she prefers to call straight to the office instead of calling to who knows where speaking to some random person. She stated that she didn't understand why nobody had called her. Explained to the pt that Ms Vaughan Basta, [facility] agent, took the call and routed the message to [place] and Laural Roes tried to call but did not get an answer and was unable to leave a voicemail. Offered pt an appt but she declined and said she was going to the ER so that she could get her referral pushed through quicker.

## 2019-10-08 NOTE — Telephone Encounter (Signed)
Pt in office for evaluation now.

## 2019-10-08 NOTE — Progress Notes (Signed)
BP (!) 159/70   Pulse 61   Temp 98.9 F (37.2 C)   LMP  (LMP Unknown)   SpO2 99%    Subjective:    Patient ID: Savannah [name], female    DOB: August 27, 1946, 73 y.o.   MRN: [id number]  HPI: Savannah [name] is a 73 y.o. female  Chief Complaint  Patient presents with  . Back Pain    Patient states that it has been 3 weeks and that she would like to know what the next step is, she is in a lot of pain.    Patient here today for ER f/u for her ongoing right flank pain extending toward mid back. Labs benign during ER workup, CT abdomen pelvis performed without notable abnormalities. ER Provider states likely musculoskeletal origin of pain. Seems positional, able sometimes to find positions that relieve it. Hydrocodone from the ER did help her feel normal the past week when taking. Now taking NSAIDs or tylenol with relief temporarily. Denies any new sxs, but the pain does seem to be worsening. No fevers, chills, hematuria, radiation down legs, N/V/D, abdominal pain.   Relevant past medical, surgical, family and social history reviewed and updated as indicated. Interim medical history since our last visit reviewed. Allergies and medications reviewed and updated.  Review of Systems  Per HPI unless specifically indicated above     Objective:    BP (!) 159/70   Pulse 61   Temp 98.9 F (37.2 C)   LMP  (LMP Unknown)   SpO2 99%   Wt Readings from Last 3 Encounters:  10/01/19 145 lb 8.1 oz (66 kg)  09/24/19 145 lb (65.8 kg)  08/03/19 150 lb (68 kg)    Physical Exam Vitals signs and nursing note reviewed.  Constitutional:      Appearance: Normal appearance. She is not ill-appearing.  HENT:     Head: Atraumatic.  Eyes:     Extraocular Movements: Extraocular movements intact.     Conjunctiva/sclera: Conjunctivae normal.  Neck:     Musculoskeletal: Normal range of motion and neck supple.  Cardiovascular:     Rate and Rhythm: Normal rate and regular rhythm.     Heart sounds: Normal heart  sounds.  Pulmonary:     Effort: Pulmonary effort is normal.     Breath sounds: Normal breath sounds.  Abdominal:     General: Bowel sounds are normal. There is no distension.     Palpations: Abdomen is soft.     Tenderness: There is no abdominal tenderness. There is no right CVA tenderness, left CVA tenderness or guarding.  Musculoskeletal: Normal range of motion.        General: No swelling or deformity.  Skin:    General: Skin is warm and dry.  Neurological:     Mental Status: She is alert and oriented to person, place, and time.  Psychiatric:        Mood and Affect: Mood normal.        Thought Content: Thought content normal.        Judgment: Judgment normal.     Results for orders placed or performed during the hospital encounter of 10/01/19  Lipase, blood  Result Value Ref Range   Lipase 19 11 - 51 U/L  Comprehensive metabolic panel  Result Value Ref Range   Sodium 139 135 - 145 mmol/L   Potassium 3.7 3.5 - 5.1 mmol/L   Chloride 106 98 - 111 mmol/L   CO2 27 22 -  32 mmol/L   Glucose, Bld 128 (H) 70 - 99 mg/dL   BUN 13 8 - 23 mg/dL   Creatinine, Ser 0.95 0.44 - 1.00 mg/dL   Calcium 9.0 8.9 - 10.3 mg/dL   Total Protein 6.7 6.5 - 8.1 g/dL   Albumin 4.2 3.5 - 5.0 g/dL   AST 15 15 - 41 U/L   ALT 14 0 - 44 U/L   Alkaline Phosphatase 54 38 - 126 U/L   Total Bilirubin 0.6 0.3 - 1.2 mg/dL   GFR calc non Af Amer 59 (L) >60 mL/min   GFR calc Af Amer >60 >60 mL/min   Anion gap 6 5 - 15  CBC  Result Value Ref Range   WBC 5.4 4.0 - 10.5 K/uL   RBC 4.50 3.87 - 5.11 MIL/uL   Hemoglobin 14.2 12.0 - 15.0 g/dL   HCT 43.1 36.0 - 46.0 %   MCV 95.8 80.0 - 100.0 fL   MCH 31.6 26.0 - 34.0 pg   MCHC 32.9 30.0 - 36.0 g/dL   RDW 12.2 11.5 - 15.5 %   Platelets 215 150 - 400 K/uL   nRBC 0.0 0.0 - 0.2 %  Urinalysis, Complete w Microscopic  Result Value Ref Range   Color, Urine COLORLESS (A) YELLOW   APPearance CLEAR (A) CLEAR   Specific Gravity, Urine >1.046 (H) 1.005 - 1.030   pH  5.0 5.0 - 8.0   Glucose, UA NEGATIVE NEGATIVE mg/dL   Hgb urine dipstick NEGATIVE NEGATIVE   Bilirubin Urine NEGATIVE NEGATIVE   Ketones, ur NEGATIVE NEGATIVE mg/dL   Protein, ur NEGATIVE NEGATIVE mg/dL   Nitrite NEGATIVE NEGATIVE   Leukocytes,Ua NEGATIVE NEGATIVE   WBC, UA NONE SEEN 0 - 5 WBC/hpf   Bacteria, UA NONE SEEN NONE SEEN   Squamous Epithelial / LPF NONE SEEN 0 - 5      Assessment & Plan:   Problem List Items Addressed This Visit    None    Visit Diagnoses    Right flank pain    -  Primary   Relevant Orders   DG Ribs Unilateral Right (Completed)   DG Thoracic Spine W/Swimmers (Completed)    Will obtain x-rays of right rib and thoracic spine. Potentially muscle spasms or nerve impingement in these areas causing her pain. Internal/abdominal causation so far seeming unlikely given negative workup thus far. Will start muscle relaxers and naproxen prn, massage, heat/ice alternating. Pt will consider PT if issues not resolving with this regimen. Return precautions given.   25 minute spent today in direct care and counseling with patient  Follow up plan: Return if symptoms worsen or fail to improve.

## 2019-10-08 NOTE — Patient Instructions (Signed)
[facility] [address], [place], [place] [address] [phone] 

## 2019-10-15 DIAGNOSIS — R109 Unspecified abdominal pain: Secondary | ICD-10-CM

## 2019-10-22 ENCOUNTER — Ambulatory Visit
Admission: RE | Admit: 2019-10-22 | Discharge: 2019-10-22 | Disposition: A | Discharge status: Home / Self Care | Payer: Medicare Other | Source: Ambulatory Visit | Attending: Family Medicine | Admitting: Family Medicine

## 2019-10-22 ENCOUNTER — Other Ambulatory Visit: Payer: Self-pay

## 2019-10-22 ENCOUNTER — Encounter: Payer: Self-pay | Admitting: Family Medicine

## 2019-10-22 ENCOUNTER — Ambulatory Visit (INDEPENDENT_AMBULATORY_CARE_PROVIDER_SITE_OTHER): Payer: Medicare Other | Admitting: Family Medicine

## 2019-10-22 VITALS — BP 139/82 | HR 88 | Temp 98.6°F

## 2019-10-22 DIAGNOSIS — J90 Pleural effusion, not elsewhere classified: Secondary | ICD-10-CM | POA: Diagnosis present

## 2019-10-22 NOTE — Progress Notes (Signed)
BP 139/82   Pulse 88   Temp 98.6 F (37 C) (Oral)   LMP  (LMP Unknown)   SpO2 99%    Subjective:    Patient ID: Savannah [name], female    DOB: [birth year], 73 y.o.   MRN: [id number]  HPI: Savannah [name] is a 73 y.o. female  Chief Complaint  Patient presents with  . Results    MRI results   Patient here today concerned about recent MRI spine done through Orthopedics. Has not yet received results call from ordering provider, but results were released to her mychart account for viewing. Saw portion of impression about b/l pleural effusions and became very concerned, started researching online. Has been dealing with this persistent right flank pain that has been worked up by several specialties at this point without clear cause, and very concerned about this new finding particularly given her fhx of cancers. Denies cough, SOB, recent illness, hx of pulmonary issues, orthopnea, leg edema, CP. Of note, in the last 2-3 weeks has had CT abdomen pelvis as well as CTA chest, both of which came back benign and without evidence of pleural effusion.   Relevant past medical, surgical, family and social history reviewed and updated as indicated. Interim medical history since our last visit reviewed. Allergies and medications reviewed and updated.  Review of Systems  Per HPI unless specifically indicated above     Objective:    BP 139/82   Pulse 88   Temp 98.6 F (37 C) (Oral)   LMP  (LMP Unknown)   SpO2 99%   Wt Readings from Last 3 Encounters:  10/01/19 145 lb 8.1 oz (66 kg)  09/24/19 145 lb (65.8 kg)  08/03/19 150 lb (68 kg)    Physical Exam Vitals signs and nursing note reviewed.  Constitutional:      Appearance: Normal appearance. She is not ill-appearing.  HENT:     Head: Atraumatic.  Eyes:     Extraocular Movements: Extraocular movements intact.     Conjunctiva/sclera: Conjunctivae normal.  Neck:     Musculoskeletal: Normal range of motion and neck supple.  Cardiovascular:   Rate and Rhythm: Normal rate and regular rhythm.     Heart sounds: Normal heart sounds.  Pulmonary:     Effort: Pulmonary effort is normal. No respiratory distress.     Breath sounds: Normal breath sounds. No wheezing or rales.  Abdominal:     General: Bowel sounds are normal. There is no distension.     Palpations: Abdomen is soft.     Tenderness: There is no abdominal tenderness. There is no guarding.  Musculoskeletal: Normal range of motion.        General: No swelling.  Skin:    General: Skin is warm and dry.  Neurological:     Mental Status: She is alert and oriented to person, place, and time.  Psychiatric:        Mood and Affect: Mood normal.        Thought Content: Thought content normal.        Judgment: Judgment normal.     Results for orders placed or performed in visit on 10/22/19  B Nat Peptide  Result Value Ref Range   BNP 15.7 0.0 - 100.0 pg/mL  CBC with Differential/Platelet  Result Value Ref Range   WBC 6.2 3.4 - 10.8 x10E3/uL   RBC 4.84 3.77 - 5.28 x10E6/uL   Hemoglobin 15.3 11.1 - 15.9 g/dL   Hematocrit 45.8 34.0 -  46.6 %   MCV 95 79 - 97 fL   MCH 31.6 26.6 - 33.0 pg   MCHC 33.4 31.5 - 35.7 g/dL   RDW 11.8 11.7 - 15.4 %   Platelets 193 150 - 450 x10E3/uL   Neutrophils 76 Not Estab. %   Lymphs 16 Not Estab. %   Monocytes 7 Not Estab. %   Eos 1 Not Estab. %   Basos 0 Not Estab. %   Neutrophils Absolute 4.7 1.4 - 7.0 x10E3/uL   Lymphocytes Absolute 1.0 0.7 - 3.1 x10E3/uL   Monocytes Absolute 0.4 0.1 - 0.9 x10E3/uL   EOS (ABSOLUTE) 0.1 0.0 - 0.4 x10E3/uL   Basophils Absolute 0.0 0.0 - 0.2 x10E3/uL   Immature Granulocytes 0 Not Estab. %   Immature Grans (Abs) 0.0 0.0 - 0.1 x10E3/uL  Comprehensive metabolic panel  Result Value Ref Range   Glucose 106 (H) 65 - 99 mg/dL   BUN 9 8 - 27 mg/dL   Creatinine, Ser 0.97 0.57 - 1.00 mg/dL   GFR calc non Af Amer 58 (L) >59 mL/min/1.73   GFR calc Af Amer 67 >59 mL/min/1.73   BUN/Creatinine Ratio 9 (L) 12 - 28    Sodium 145 (H) 134 - 144 mmol/L   Potassium 4.1 3.5 - 5.2 mmol/L   Chloride 103 96 - 106 mmol/L   CO2 28 20 - 29 mmol/L   Calcium 9.7 8.7 - 10.3 mg/dL   Total Protein 7.3 6.0 - 8.5 g/dL   Albumin 5.0 (H) 3.7 - 4.7 g/dL   Globulin, Total 2.3 1.5 - 4.5 g/dL   Albumin/Globulin Ratio 2.2 1.2 - 2.2   Bilirubin Total 0.5 0.0 - 1.2 mg/dL   Alkaline Phosphatase 60 39 - 117 IU/L   AST 11 0 - 40 IU/L   ALT 9 0 - 32 IU/L      Assessment & Plan:   Problem List Items Addressed This Visit    None    Visit Diagnoses    Pleural effusion    -  Primary   Relevant Orders   DG Chest 2 View (Completed)   B Nat Peptide (Completed)   CBC with Differential/Platelet (Completed)   Comprehensive metabolic panel (Completed)    Noted incidentally on MRI spine, causing patient significant anxiety. Exam benign, vitals stable, recent imaging several weeks ago benign. Check labs and CXR, suspect transient finding.  Will f/u based on results. Reassurance given  25 minutes spent today in direct care and counseling with patient  Follow up plan: Return for as scheduled.

## 2019-10-23 LAB — COMPREHENSIVE METABOLIC PANEL
ALT: 9 IU/L (ref 0–32)
AST: 11 IU/L (ref 0–40)
Albumin/Globulin Ratio: 2.2 (ref 1.2–2.2)
Albumin: 5 g/dL — ABNORMAL HIGH (ref 3.7–4.7)
Alkaline Phosphatase: 60 IU/L (ref 39–117)
BUN/Creatinine Ratio: 9 — ABNORMAL LOW (ref 12–28)
BUN: 9 mg/dL (ref 8–27)
Bilirubin Total: 0.5 mg/dL (ref 0.0–1.2)
CO2: 28 mmol/L (ref 20–29)
Calcium: 9.7 mg/dL (ref 8.7–10.3)
Chloride: 103 mmol/L (ref 96–106)
Creatinine, Ser: 0.97 mg/dL (ref 0.57–1.00)
GFR calc Af Amer: 67 mL/min/{1.73_m2} (ref >59)
GFR calc non Af Amer: 58 mL/min/{1.73_m2} — ABNORMAL LOW (ref >59)
Globulin, Total: 2.3 g/dL (ref 1.5–4.5)
Glucose: 106 mg/dL — ABNORMAL HIGH (ref 65–99)
Potassium: 4.1 mmol/L (ref 3.5–5.2)
Sodium: 145 mmol/L — ABNORMAL HIGH (ref 134–144)
Total Protein: 7.3 g/dL (ref 6.0–8.5)

## 2019-10-23 LAB — CBC WITH DIFFERENTIAL/PLATELET
Basophils Absolute: 0 10*3/uL (ref 0.0–0.2)
Basos: 0 %
EOS (ABSOLUTE): 0.1 10*3/uL (ref 0.0–0.4)
Eos: 1 %
Hematocrit: 45.8 % (ref 34.0–46.6)
Hemoglobin: 15.3 g/dL (ref 11.1–15.9)
Immature Grans (Abs): 0 10*3/uL (ref 0.0–0.1)
Immature Granulocytes: 0 %
Lymphocytes Absolute: 1 10*3/uL (ref 0.7–3.1)
Lymphs: 16 %
MCH: 31.6 pg (ref 26.6–33.0)
MCHC: 33.4 g/dL (ref 31.5–35.7)
MCV: 95 fL (ref 79–97)
Monocytes Absolute: 0.4 10*3/uL (ref 0.1–0.9)
Monocytes: 7 %
Neutrophils Absolute: 4.7 10*3/uL (ref 1.4–7.0)
Neutrophils: 76 %
Platelets: 193 10*3/uL (ref 150–450)
RBC: 4.84 x10E6/uL (ref 3.77–5.28)
RDW: 11.8 % (ref 11.7–15.4)
WBC: 6.2 10*3/uL (ref 3.4–10.8)

## 2019-10-23 LAB — BRAIN NATRIURETIC PEPTIDE: BNP: 15.7 pg/mL (ref 0.0–100.0)

## 2019-10-25 ENCOUNTER — Other Ambulatory Visit: Payer: Self-pay | Admitting: Family Medicine

## 2019-10-25 DIAGNOSIS — J90 Pleural effusion, not elsewhere classified: Secondary | ICD-10-CM

## 2019-10-29 ENCOUNTER — Ambulatory Visit: Payer: Medicare Other | Admitting: Obstetrics and Gynecology

## 2019-10-30 ENCOUNTER — Telehealth: Payer: Self-pay | Admitting: General Practice

## 2019-10-30 NOTE — Telephone Encounter (Signed)
LM to try and schedule genetics referral-ltg

## 2019-11-07 ENCOUNTER — Encounter: Payer: Medicare Other | Admitting: Family Medicine

## 2019-11-08 ENCOUNTER — Ambulatory Visit: Payer: Medicare Other | Admitting: Pulmonary Disease

## 2019-11-08 ENCOUNTER — Other Ambulatory Visit
Admission: RE | Admit: 2019-11-08 | Discharge: 2019-11-08 | Disposition: A | Discharge status: Home / Self Care | Payer: Medicare Other | Source: Ambulatory Visit | Attending: Pulmonary Disease | Admitting: Pulmonary Disease

## 2019-11-08 ENCOUNTER — Telehealth: Payer: Self-pay | Admitting: Pulmonary Disease

## 2019-11-08 ENCOUNTER — Encounter: Payer: Self-pay | Admitting: Pulmonary Disease

## 2019-11-08 ENCOUNTER — Other Ambulatory Visit: Payer: Self-pay

## 2019-11-08 VITALS — BP 136/78 | HR 77 | Temp 97.2°F | Ht 67.0 in | Wt 148.0 lb

## 2019-11-08 DIAGNOSIS — J90 Pleural effusion, not elsewhere classified: Secondary | ICD-10-CM

## 2019-11-08 DIAGNOSIS — M549 Dorsalgia, unspecified: Secondary | ICD-10-CM | POA: Insufficient documentation

## 2019-11-08 DIAGNOSIS — M25559 Pain in unspecified hip: Secondary | ICD-10-CM | POA: Diagnosis not present

## 2019-11-08 LAB — SEDIMENTATION RATE: Sed Rate: 4 mm/h (ref 0–30)

## 2019-11-08 LAB — C-REACTIVE PROTEIN: CRP: 1.5 mg/dL — ABNORMAL HIGH (ref <1.0)

## 2019-11-08 NOTE — Patient Instructions (Signed)
1.  I have reviewed your MRI, CT scan and chest film that was done after the MRI.  The very small pleural effusions seen on the MRI appear to be within normal.  2.  We have ordered some blood work and will refer you to rheumatology.  3.  I will see you back in 4 to 6 weeks time you may see my nurse practitioner at that time.

## 2019-11-08 NOTE — Telephone Encounter (Signed)
Disc has been placed in outgoing mail.  Pt is aware and voiced her understanding.  Nothing further is needed.

## 2019-11-09 LAB — ANA W/REFLEX: Anti Nuclear Antibody (ANA): NEGATIVE

## 2019-11-09 LAB — RHEUMATOID FACTOR: Rheumatoid fact SerPl-aCnc: <10 IU/mL (ref 0.0–13.9)

## 2019-11-13 LAB — HLA-B27 ANTIGEN: HLA-B27: NEGATIVE

## 2019-11-21 ENCOUNTER — Encounter: Payer: Self-pay | Admitting: Family Medicine

## 2019-11-21 ENCOUNTER — Telehealth: Payer: Self-pay | Admitting: Pulmonary Disease

## 2019-11-21 NOTE — Telephone Encounter (Signed)
Pt is aware that disc will be placed in outgoing mail today.  Explained delay to pt, as our office did not have a big enough envelope to mail disc. Pt voiced her understanding.

## 2019-11-21 NOTE — Telephone Encounter (Signed)
Will route message to CG for lab results. There is a delay with the local mail. Will inform pt when we call with lab results.

## 2019-11-22 ENCOUNTER — Encounter: Payer: Self-pay | Admitting: Pulmonary Disease

## 2019-11-22 ENCOUNTER — Other Ambulatory Visit: Payer: Self-pay | Admitting: Family Medicine

## 2019-11-22 DIAGNOSIS — J90 Pleural effusion, not elsewhere classified: Secondary | ICD-10-CM

## 2019-11-22 NOTE — Progress Notes (Signed)
Subjective:    Patient ID: Savannah [name], female    DOB: 1946/08/03, 73 y.o.   MRN: [id number]  HPI  The patient is a 73 year old remote former smoker (quit 1970 about 4 pack years) who presents for evaluation of very small bilateral pleural effusions noted on MRI.  She is kindly referred by Volney American, PA-C.  The patient noted some back pain in mid September that led her to have a visit to the ED on October 12.  She had mid lower back pain and right flank pain and aortic dissection was considered.  A CT angio chest and abdomen and pelvis CT that showed no acute intrathoracic, abdominal or pelvic pathology.  There was no pulmonary embolism.  No evidence of aortic dissection.  Specifically, there was no pleural effusion.  She persisted in having this abdominal pain and underwent an MRI on October 30 at [facility].  This MRI showed very small bilateral pleural effusions almost imperceptible. I have personally reviewed this film.  The patient has not had any fevers, chills or sweats.  No cough or sputum production.  She continues to have issues with the back pain and has difficulties sometimes standing erect because of it.  Has had some other joint pains as well.  She has noted increasing back stiffness.  She has had no orthopnea, paroxysmal nocturnal dyspnea or lower extremity edema.  No hemoptysis.  As noted the patient has a remote history of smoking.  Past medical history, surgical history and family history have been reviewed.  She has no significant occupational exposure.  Review of Systems  Constitutional: Negative for activity change (Decreased due to pain).  HENT: Negative.   Eyes: Negative.   Respiratory: Negative.   Cardiovascular: Negative.   Gastrointestinal: Negative.   Endocrine: Negative.   Genitourinary: Negative.   Musculoskeletal: Positive for arthralgias and back pain.  Skin: Negative.   Allergic/Immunologic: Negative.   Neurological: Negative.   Hematological:  Negative.   Psychiatric/Behavioral: Negative.   All other systems reviewed and are negative.      Objective:   Physical Exam Vitals signs and nursing note reviewed.  Constitutional:      General: She is not in acute distress.    Appearance: Normal appearance. She is normal weight. She is not ill-appearing.  HENT:     Head: Normocephalic and atraumatic.     Right Ear: External ear normal.     Left Ear: External ear normal.     Nose:     Comments: Nose/mouth/throat not examined due to masking requirements for COVID 19. Eyes:     General: No scleral icterus.    Conjunctiva/sclera: Conjunctivae normal.     Pupils: Pupils are equal, round, and reactive to light.  Neck:     Musculoskeletal: Neck supple.     Thyroid: No thyromegaly.     Vascular: No JVD.     Trachea: Trachea and phonation normal.  Cardiovascular:     Rate and Rhythm: Normal rate.     Pulses: Normal pulses.     Heart sounds: Normal heart sounds. No murmur.  Pulmonary:     Effort: Pulmonary effort is normal.     Breath sounds: Normal breath sounds.  Abdominal:     General: Abdomen is flat. There is no distension.  Musculoskeletal:     Right lower leg: No edema.     Left lower leg: No edema.     Comments: Limited range of motion with stiff gait.  Lymphadenopathy:  Cervical: No cervical adenopathy.  Skin:    General: Skin is warm and dry.  Neurological:     General: No focal deficit present.     Mental Status: She is alert and oriented to person, place, and time.  Psychiatric:        Mood and Affect: Mood normal.        Behavior: Behavior normal.    I have reviewed the MRI from [facility] performed on 19 October 2019.  With regards to the thoracic findings the patient has very small bilateral pleural effusions that are not amenable to thoracentesis or other intervention and likely represent physiologic amount of pleural fluid.  Of note a follow-up chest x-ray performed on 2 November for the same thoracic pain  showed no pleural effusions whatsoever, film as below:   This film was shown to the patient.    Assessment & Plan:   1.  Bilateral pleural effusions: Pleural effusions particularly bilateral, are a mirror of systemic disease.  However, in this situation it appears that the imaging captured physiologic amount of fluid that is normally present in the pleural space.  A follow-up chest x-ray failed to show the pleural effusion.  MRI is highly sensitive and likely caught the physiologic amount of fluid normally present in the pleural cavity.  This fluid is not amenable for evaluation by thoracentesis or any other means.  The amount is minute.  The patient was reassured in this regard.  Note that the effusions were also not present on a prior CT of the chest.  The only imaging that has shown this is the MRI which is highly sensitive.  No further work-up is necessary in this regard.  2.  Joint pain, hip pain and back pain: Will obtain rheumatoid markers and recommend evaluation by rheumatology.  These issues are beyond my expertise.  Will obtain sed rate, CRP, ANA, rheumatoid factor, and HLA-B27.  These so that they can be available for when she is evaluated at rheumatology.  3.  We will see the patient in follow-up 1 more time in 4 to 6 weeks time she may see our nurse practitioner at that time.  She is to contact us prior to that time should any new difficulties arise.   Thank you for allowing me to participate in this patient's care.  Renold Don, MD [facility]   This note was dictated using voice recognition software/Dragon.  Despite best efforts to proofread, errors can occur which can change the meaning.  Any change was purely unintentional.

## 2019-11-22 NOTE — Telephone Encounter (Signed)
All laboratory data was negative.  Referral to rheumatology may take some time due to COVID-19 restrictions.

## 2019-11-23 NOTE — Telephone Encounter (Signed)
Pt is aware of below message and voiced her understanding. Nothing further is needed. 

## 2019-11-28 ENCOUNTER — Telehealth: Payer: Self-pay | Admitting: Pulmonary Disease

## 2019-11-28 NOTE — Telephone Encounter (Signed)
Called and spoke to pt, who states that she has not receive disc in the mail as of yet.  Pt is aware that disc was mailed out on 11/22/2019. Please see 11/21/2019 phone note.  Pt is aware that there may be delay in receiving mail due to covid.  Pt will call back if disc has still not been received in the next few days.  Nothing further is needed.

## 2019-12-06 ENCOUNTER — Encounter: Payer: Self-pay | Admitting: Family Medicine

## 2019-12-10 ENCOUNTER — Telehealth: Payer: Self-pay | Admitting: Pulmonary Disease

## 2019-12-10 NOTE — Telephone Encounter (Signed)
I recommend that she go to a rheumatologist within the [facility] group that she is seeing.  She will need to do this on her own as the rheumatologist has declined the consult.  I will also not refer to this rheumatologist in the future.

## 2019-12-10 NOTE — Telephone Encounter (Signed)
Referral sent to Dr. Estanislado Pandy.  Each referral is reviewed by physician and upon review Dr. Estanislado Pandy has declined to see patient.  Please advise if you would like to send referral to anyone else.  Rhonda J Cobb

## 2019-12-11 ENCOUNTER — Ambulatory Visit: Payer: Self-pay | Admitting: *Deleted

## 2019-12-11 ENCOUNTER — Emergency Department: Payer: Medicare Other

## 2019-12-11 ENCOUNTER — Other Ambulatory Visit: Payer: Self-pay

## 2019-12-11 ENCOUNTER — Emergency Department
Admission: EM | Admit: 2019-12-11 | Discharge: 2019-12-11 | Disposition: A | Discharge status: Home / Self Care | Payer: Medicare Other | Attending: Student in an Organized Health Care Education/Training Program | Admitting: Student in an Organized Health Care Education/Training Program

## 2019-12-11 DIAGNOSIS — E039 Hypothyroidism, unspecified: Secondary | ICD-10-CM | POA: Diagnosis not present

## 2019-12-11 DIAGNOSIS — I10 Essential (primary) hypertension: Secondary | ICD-10-CM | POA: Diagnosis present

## 2019-12-11 DIAGNOSIS — Z85828 Personal history of other malignant neoplasm of skin: Secondary | ICD-10-CM | POA: Insufficient documentation

## 2019-12-11 DIAGNOSIS — R519 Headache, unspecified: Secondary | ICD-10-CM | POA: Insufficient documentation

## 2019-12-11 DIAGNOSIS — Z87891 Personal history of nicotine dependence: Secondary | ICD-10-CM | POA: Insufficient documentation

## 2019-12-11 DIAGNOSIS — Z79899 Other long term (current) drug therapy: Secondary | ICD-10-CM | POA: Diagnosis not present

## 2019-12-11 LAB — CBC
HCT: 41.3 % (ref 36.0–46.0)
Hemoglobin: 14.2 g/dL (ref 12.0–15.0)
MCH: 31.6 pg (ref 26.0–34.0)
MCHC: 34.4 g/dL (ref 30.0–36.0)
MCV: 91.8 fL (ref 80.0–100.0)
Platelets: 195 10*3/uL (ref 150–400)
RBC: 4.5 MIL/uL (ref 3.87–5.11)
RDW: 12.4 % (ref 11.5–15.5)
WBC: 5.6 10*3/uL (ref 4.0–10.5)
nRBC: 0 % (ref 0.0–0.2)

## 2019-12-11 LAB — BASIC METABOLIC PANEL
Anion gap: 12 (ref 5–15)
BUN: 9 mg/dL (ref 8–23)
CO2: 24 mmol/L (ref 22–32)
Calcium: 8.9 mg/dL (ref 8.9–10.3)
Chloride: 106 mmol/L (ref 98–111)
Creatinine, Ser: 0.62 mg/dL (ref 0.44–1.00)
GFR calc Af Amer: >60 mL/min (ref >60)
GFR calc non Af Amer: >60 mL/min (ref >60)
Glucose, Bld: 100 mg/dL — ABNORMAL HIGH (ref 70–99)
Potassium: 3.7 mmol/L (ref 3.5–5.1)
Sodium: 142 mmol/L (ref 135–145)

## 2019-12-11 LAB — TROPONIN I (HIGH SENSITIVITY): Troponin I (High Sensitivity): 3 ng/L (ref <18)

## 2019-12-11 MED ORDER — AMLODIPINE BESYLATE 5 MG PO TABS
5.0000 mg | ORAL_TABLET | Freq: Once | ORAL | Status: AC
  Administered 2019-12-11: 5 mg via ORAL
  Filled 2019-12-11: qty 1

## 2019-12-11 MED ORDER — METOCLOPRAMIDE HCL 5 MG/ML IJ SOLN
10.0000 mg | Freq: Once | INTRAMUSCULAR | Status: AC
  Administered 2019-12-11: 10 mg via INTRAVENOUS
  Filled 2019-12-11: qty 2

## 2019-12-11 MED ORDER — DIPHENHYDRAMINE HCL 50 MG/ML IJ SOLN
12.5000 mg | Freq: Once | INTRAMUSCULAR | Status: AC
  Administered 2019-12-11: 12.5 mg via INTRAVENOUS
  Filled 2019-12-11: qty 1

## 2019-12-11 MED ORDER — AMLODIPINE BESYLATE 2.5 MG PO TABS: 2.5000 mg | ORAL_TABLET | Freq: Every day | ORAL | 0 refills | Status: DC

## 2019-12-11 NOTE — Telephone Encounter (Signed)
LMOVM for pt to return call about referral. Catha Gosselin

## 2019-12-11 NOTE — ED Provider Notes (Signed)
[facility] Emergency Department Provider Note    First MD Initiated Contact with Patient 12/11/19 1422     (approximate)  I have reviewed the triage vital signs and the nursing notes.   HISTORY  Chief Complaint Hypertension    HPI Savannah [name] is a 73 y.o. female [place] past medical history presents the ER for evaluation of elevated blood pressure and headache.  States she does have a history of migraine headaches last one being about 30 months ago.  Says she is also been dealing with chronic back pain was actually neurosurgery clinic today noted to be hypertensive.  Denies any numbness or tingling.  No blurry vision.  Denies any chest pain or shortness of breath.  No nausea or vomiting.  No recent fevers.  Does take metoprolol.  Not currently taking Norvasc although it is in her home medication list.  Has been taking Advil for her back pain.    Past Medical History:  Diagnosis Date  . Anxiety   . Cancer (HCC)    skin- squamous  . HOH (hard of hearing)    WEARS AIDS  . Hyperlipidemia   . Hypertension    CONTROLLED ON MEDS  . Hypothyroidism   . IBS (irritable bowel syndrome)   . MVP (mitral valve prolapse)   . OA (osteoarthritis) of neck    JOINTS  . Osteoporosis   . Thyroid disease    Family History  Problem Relation Age of Onset  . Cancer Mother        colon  . Hypertension Mother   . Cancer Father        lung  . Hyperlipidemia Brother   . Breast cancer Neg Hx    Past Surgical History:  Procedure Laterality Date  . ANKLE FRACTURE SURGERY Left   . BREAST CYST ASPIRATION Right 90s  . COLONOSCOPY WITH PROPOFOL N/A 04/12/2016   Procedure: COLONOSCOPY WITH PROPOFOL;  Surgeon: Manya Silvas, MD;  Location: [facility] ENDOSCOPY;  Service: Endoscopy;  Laterality: N/A;  . FRACTURE SURGERY     ANKLE  . HAMMER TOE SURGERY Right 02/08/2018   Procedure: HAMMER TOE CORRECTION-5TH TOE, Excision soft tissue mass dorsal lateral right fifth toe and  exostectomy dorsal lateral DIPJ fifth toe;  Surgeon: Samara Deist, DPM;  Location: [place];  Service: Podiatry;  Laterality: Right;  IVA LOCAL  . moses surgery  09/2015  . SINUS SURGERY WITH INSTATRAK  10/2017   DEVIATED SEPTUM  . 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 Right knee pain 05/01/2018  . Advance care planning 04/15/2017  . Family history of colon cancer 04/13/2016  . Chronic anxiety 04/13/2016  . Hypothyroidism 09/15/2015  . MVP (mitral valve prolapse) 09/15/2015  . Osteoporosis 09/15/2015  . OA (osteoarthritis) of neck 09/15/2015  . Hyperlipidemia 09/15/2015  . Essential hypertension 09/15/2015  . Migraines 09/15/2015      Prior to Admission medications   Medication Sig Start Date End Date Taking? Authorizing Provider  ALPRAZolam (XANAX) 0.25 MG tablet Take 1 tablet (0.25 mg total) by mouth at bedtime as needed for anxiety. 05/03/19   Volney American, PA-C  amLODipine (NORVASC) 2.5 MG tablet Take 1 tablet (2.5 mg total) by mouth daily. 12/11/19   Volney American, PA-C  atorvastatin (LIPITOR) 20 MG tablet Take 1 tablet (20 mg total) by mouth daily. AM 05/03/19   Volney American, PA-C  esomeprazole (NEXIUM) 40 MG capsule Take by mouth as needed.  04/10/19   [provider]  fluticasone (FLONASE) 50 MCG/ACT nasal spray Place 2 sprays into both nostrils daily. 09/13/17   Kathrine Haddock, NP  ibandronate (BONIVA) 150 MG tablet Take in the morning with a full glass of water, on an empty stomach, and do not take anything else by mouth or lie down for the next 30 min. 05/03/19   Volney American, PA-C  levothyroxine (SYNTHROID) 75 MCG tablet Take 1 tablet (75 mcg total) by mouth daily before breakfast. 05/03/19   Volney American, PA-C  metoprolol succinate (TOPROL-XL) 50 MG 24 hr tablet Take 1/2 tablet (25 mg) daily with or immediately following a meal. 05/03/19    Volney American, PA-C  scopolamine (TRANSDERM-SCOP, 1.5 MG,) 1 MG/3DAYS Place 1 patch (1.5 mg total) onto the skin every 3 (three) days. 05/03/19   Volney American, PA-C    Allergies Amoxil [amoxicillin] and Penicillins    Social History Social History   Tobacco Use  . Smoking status: Former Smoker    Packs/day: 1.00    Years: 4.00    Pack years: 4.00    Types: Cigarettes    Quit date: 1970    Years since quitting: 51.0  . Smokeless tobacco: Never Used  Substance Use Topics  . Alcohol use: Yes    Alcohol/week: 10.0 standard drinks    Types: 10 Glasses of wine per week  . Drug use: No    Review of Systems Patient denies headaches, rhinorrhea, blurry vision, numbness, shortness of breath, chest pain, edema, cough, abdominal pain, nausea, vomiting, diarrhea, dysuria, fevers, rashes or hallucinations unless otherwise stated above in HPI. ____________________________________________   PHYSICAL EXAM:  VITAL SIGNS: Vitals:   12/11/19 1301 12/11/19 1425  BP: (!) 183/86 (!) 188/77  Pulse: 83 80  Resp: 20   Temp: 98.8 F (37.1 C) 98.7 F (37.1 C)  SpO2: 99% 99%    Constitutional: Alert and oriented.  Eyes: Conjunctivae are normal.  Head: Atraumatic. Nose: No congestion/rhinnorhea. Mouth/Throat: Mucous membranes are moist.   Neck: No stridor. Painless ROM.  Cardiovascular: Normal rate, regular rhythm. Grossly normal heart sounds.  Good peripheral circulation. Respiratory: Normal respiratory effort.  No retractions. Lungs CTAB. Gastrointestinal: Soft and nontender. No distention. No abdominal bruits. No CVA tenderness. Genitourinary:  Musculoskeletal: No lower extremity tenderness nor edema.  No joint effusions. Neurologic:  CN- intact.  No facial droop, Normal FNF.  Normal heel to shin.  Sensation intact bilaterally. Normal speech and language. No gross focal neurologic deficits are appreciated. No gait instability. Skin:  Skin is warm, dry and intact. No  rash noted. Psychiatric: Mood and affect are normal. Speech and behavior are normal.  ____________________________________________   LABS (all labs ordered are listed, but only abnormal results are displayed)  Results for orders placed or performed during the hospital encounter of 12/11/19 (from the past 24 hour(s))  Basic metabolic panel     Status: Abnormal   Collection Time: 12/11/19  1:10 PM  Result Value Ref Range   Sodium 142 135 - 145 mmol/L   Potassium 3.7 3.5 - 5.1 mmol/L   Chloride 106 98 - 111 mmol/L   CO2 24 22 - 32 mmol/L   Glucose, Bld 100 (H) 70 - 99 mg/dL   BUN 9 8 - 23 mg/dL   Creatinine, Ser 0.62 0.44 - 1.00 mg/dL   Calcium 8.9 8.9 - 10.3 mg/dL   GFR calc non Af Amer >  60 >60 mL/min   GFR calc Af Amer >60 >60 mL/min   Anion gap 12 5 - 15  CBC     Status: None   Collection Time: 12/11/19  1:10 PM  Result Value Ref Range   WBC 5.6 4.0 - 10.5 K/uL   RBC 4.50 3.87 - 5.11 MIL/uL   Hemoglobin 14.2 12.0 - 15.0 g/dL   HCT 41.3 36.0 - 46.0 %   MCV 91.8 80.0 - 100.0 fL   MCH 31.6 26.0 - 34.0 pg   MCHC 34.4 30.0 - 36.0 g/dL   RDW 12.4 11.5 - 15.5 %   Platelets 195 150 - 400 K/uL   nRBC 0.0 0.0 - 0.2 %  Troponin I (High Sensitivity)     Status: None   Collection Time: 12/11/19  1:10 PM  Result Value Ref Range   Troponin I (High Sensitivity) 3 <18 ng/L   ____________________________________________  EKG My review and personal interpretation at Time: 13:11   Indication: htn  Rate: 70  Rhythm: sinus Axis: normal Other: normal intervals, no stemi ____________________________________________  RADIOLOGY  I personally reviewed all radiographic images ordered to evaluate for the above acute complaints and reviewed radiology reports and findings.  These findings were personally discussed with the patient.  Please see medical record for radiology report.  ____________________________________________   PROCEDURES  Procedure(s) performed:   Procedures    Critical Care performed: no ____________________________________________   INITIAL IMPRESSION / ASSESSMENT AND PLAN / ED COURSE  Pertinent labs & imaging results that were available during my care of the patient were reviewed by me and considered in my medical decision making (see chart for details).   DDX: htnive urgency, chf, sah, iph, arf, medication effect  THOMASA SOLIS is a 73 y.o. who presents to the ED with symptoms as described above.  She is exceedingly well-appearing clinically.  Does seem mildly anxious but in no acute distress.  Blood pressures were elevated.  Is describing mild headache.  No meningismus.  Neuro exam is reassuring and nonfocal.  CT imaging will be ordered to evaluate for by differential however seems clinically less likely [facility] IPH or other acute intracranial abnormality.-Chest pain or shortness of breath.  Will evaluate for any signs of endorgan damage given her elevated blood pressure provide symptomatic management and reassess.  Clinical Course as of Dec 10 1605  Tue Dec 11, 2019  1603 Patient reassessed.  She remains exceedingly well-appearing given her reassuring work-up I do believe she is appropriate for outpatient follow-up.  She was previously on Norvasc and given her elevated blood pressure will reorder this.  She does have close outpatient follow-up.  Have discussed with the patient and available family all diagnostics and treatments performed thus far and all questions were answered to the best of my ability. The patient demonstrates understanding and agreement with plan.    [PR]    Clinical Course User Index [PR] Merlyn Lot, MD    The patient was evaluated in Emergency Department today for the symptoms described in the history of present illness. He/she was evaluated in the context of the global COVID-19 pandemic, which necessitated consideration that the patient might be at risk for infection with the SARS-CoV-2 virus that  causes COVID-19. Institutional protocols and algorithms that pertain to the evaluation of patients at risk for COVID-19 are in a state of rapid change based on information released by regulatory bodies including the CDC and federal and state organizations. These policies and algorithms were followed during  the patient's care in the ED.  As part of my medical decision making, I reviewed the following data within the [place] notes reviewed and incorporated, Labs reviewed, notes from prior ED visits and [place] Controlled Substance Database   ____________________________________________   FINAL CLINICAL IMPRESSION(S) / ED DIAGNOSES  Final diagnoses:  Hypertension, unspecified type      NEW MEDICATIONS STARTED DURING THIS VISIT:  New Prescriptions   No medications on file     Note:  This document was prepared using Dragon voice recognition software and may include unintentional dictation errors.    Merlyn Lot, MD 12/11/19 [phone]

## 2019-12-11 NOTE — Telephone Encounter (Addendum)
Pt called stating her BP was 174/99 on 12/10/2019 (in GI office); today her readings are 138/90 (left upper arm) at 0730 and 165/88 P 75 (right upper arm) at 0900; she is having a headache that woke her up at [facility]; she has no medication for her headache rated 3 out of 10; she has not missed any doses of her metoprolol; her SBP is normally in the 120s; recommendations made per nurse triage protocol; she verbalized understanding; the pt sees Merrie Roof, [facility]; spoke with Laural Roes, and she requests this information be sent to office for provider review; the pt can be contacted at [phone]; will route per request  Reason for Disposition . Systolic BP  >= 0000000 OR Diastolic >= [id number]  Answer Assessment - Initial Assessment Questions 1. BLOOD PRESSURE: "What is the blood pressure?" "Did you take at least two measurements 5 minutes apart?"     138/90 and 165/88 2. ONSET: "When did you take your blood pressure?"     12/11/2019 at 0730 and 090 3. HOW: "How did you obtain the blood pressure?" (e.g., visiting nurse, automatic home BP monitor)    Home cuff  4. HISTORY: "Do you have a history of high blood pressure?"     yes 5. MEDICATIONS: "Are you taking any medications for blood pressure?" "Have you missed any doses recently?"   no 6. OTHER SYMPTOMS: "Do you have any symptoms?" (e.g., headache, chest pain, blurred vision, difficulty breathing, weakness)   Headache  7. PREGNANCY: "Is there any chance you are pregnant?" "When was your last menstrual period?"     no  Protocols used: HIGH BLOOD PRESSURE-A-AH

## 2019-12-11 NOTE — Telephone Encounter (Signed)
Sent over low dose amlodipine she can take on days it's reading high, and let's see her next week to see how her readings and symptoms have been doing on that regimen. Call back if not improving in meantime

## 2019-12-11 NOTE — Addendum Note (Signed)
Addended by: Merrie Roof E on: 12/11/2019 11:22 AM   Modules accepted: Orders

## 2019-12-11 NOTE — Telephone Encounter (Signed)
Called patient. LVM for patient to return call to the office.

## 2019-12-11 NOTE — Telephone Encounter (Signed)
LMOVM for pt to return call. Rhonda J Cobb

## 2019-12-11 NOTE — ED Triage Notes (Signed)
Pt c/o high blood pressure x2 days. States it was 138/89 this morning and at nine am today 160/95, and at ten am was 180/110. 222/90's at pcp "ten minutes ago." Pt c/o headache. Denies CP, SOB, N/V, dizziness.  Pt aox4. nad noted.

## 2019-12-12 NOTE — Telephone Encounter (Signed)
Called and left patient a VM asking for her to please return my call.  

## 2019-12-13 NOTE — Telephone Encounter (Signed)
Patient notified, she went to the ER and was given meds, BP this morning was better. She states that she will keep you up to date and she has a follow up with Cardiology on the 13th.

## 2019-12-17 ENCOUNTER — Encounter: Payer: Self-pay | Admitting: Family Medicine

## 2019-12-18 ENCOUNTER — Other Ambulatory Visit: Payer: Self-pay | Admitting: Family Medicine

## 2019-12-18 ENCOUNTER — Telehealth: Payer: Self-pay | Admitting: Family Medicine

## 2019-12-18 MED ORDER — AMLODIPINE BESYLATE 2.5 MG PO TABS: 2.5000 mg | ORAL_TABLET | Freq: Every day | ORAL | 0 refills | Status: DC

## 2019-12-18 NOTE — Telephone Encounter (Signed)
Routing to provider  

## 2019-12-18 NOTE — Telephone Encounter (Signed)
Rx refilled.

## 2019-12-18 NOTE — Telephone Encounter (Signed)
amLODipine (NORVASC) 2.5 MG tablet     Patient requesting a partial refill of this medication. Patient has been two tablets. Patient states she will be out before appointment on 1/6. Please advise.     Pharmacy:  [facility], [place] Phone:  [phone]  Fax:  [phone]

## 2019-12-24 NOTE — Telephone Encounter (Signed)
Called and spoke with patient and she is aware that physician we referred her to is not seeing patient for that type of issue. Advised patient that Dr. Patsey Berthold advised patient to see the Rheumatologist at [facility]. Pt voiced understanding and thanked me for my call. Rhonda J Cobb

## 2019-12-25 ENCOUNTER — Ambulatory Visit: Payer: Self-pay | Admitting: *Deleted

## 2019-12-25 NOTE — Telephone Encounter (Signed)
Pt called stating her BP was 164/102 12/25/19 at 1140 on her left upper; it 177/84 on right upper arm at 1150; she also complains of headache for 3 hours; her pain is rated 5 out of 10; she just took 2 extra strength Tylenol; recommendations made per nurse triage protocol; the pt verbalized understanding; she would like to be seen by Dr Wynetta Emery today since her provider is gone for the day; the pt has an appt scheduled for 12/25/2018; she can be contacted at [phone]; will route to office for final disposition.  Reason for Disposition . Systolic BP  >= [id number] OR Diastolic >= A999333  Answer Assessment - Initial Assessment Questions 1. BLOOD PRESSURE: "What is the blood pressure?" "Did you take at least two measurements 5 minutes apart?"     164/102 and 177/84 2. ONSET: "When did you take your blood pressure?"    12/25/19 1140 and 1150 3. HOW: "How did you obtain the blood pressure?" (e.g., visiting nurse, automatic home BP monitor)     Home cuff left and right upper arm 4. HISTORY: "Do you have a history of high blood pressure?"     yes 5. MEDICATIONS: "Are you taking any medications for blood pressure?" "Have you missed any doses recently?"     Yes, no missed doses 6. OTHER SYMPTOMS: "Do you have any symptoms?" (e.g., headache, chest pain, blurred vision, difficulty breathing, weakness)    headache 7. PREGNANCY: "Is there any chance you are pregnant?" "When was your last menstrual period?"     no  Protocols used: HIGH BLOOD PRESSURE-A-AH

## 2019-12-25 NOTE — Telephone Encounter (Signed)
I did not have any availability. OK to schedule ASAP

## 2019-12-26 ENCOUNTER — Ambulatory Visit: Payer: Medicare PPO | Admitting: Family Medicine

## 2019-12-26 ENCOUNTER — Other Ambulatory Visit: Payer: Self-pay

## 2019-12-26 ENCOUNTER — Encounter: Payer: Self-pay | Admitting: Family Medicine

## 2019-12-26 VITALS — BP 127/75 | HR 78 | Temp 98.5°F | Ht 67.0 in | Wt 152.0 lb

## 2019-12-26 DIAGNOSIS — I10 Essential (primary) hypertension: Secondary | ICD-10-CM

## 2019-12-26 DIAGNOSIS — F419 Anxiety disorder, unspecified: Secondary | ICD-10-CM

## 2019-12-26 DIAGNOSIS — R109 Unspecified abdominal pain: Secondary | ICD-10-CM

## 2019-12-26 MED ORDER — AMLODIPINE BESYLATE 5 MG PO TABS: 5.0000 mg | ORAL_TABLET | Freq: Every day | ORAL | 1 refills | Status: DC

## 2019-12-26 NOTE — Assessment & Plan Note (Signed)
Will increase amlodipine to 7.5 mg daily, 5 mg in AM and adding the 2.5 mg dose in afternoon if BPs elevated. Continue metoprolol. Normal BP ranges and emergent range +sxs reviewed. Has f/u next week with Cardiologist, will discuss BPs with them as well at this time once already on new dose for a week.

## 2019-12-26 NOTE — Progress Notes (Signed)
BP 127/75   Pulse 78   Temp 98.5 F (36.9 C) (Oral)   Ht 5\' 7"  (1.702 m)   Wt 152 lb (68.9 kg)   LMP  (LMP Unknown)   SpO2 97%   BMI 23.81 kg/m    Subjective:    Patient ID: LESIE SETTER, female    DOB: 1946-03-18, 74 y.o.   MRN: [id number]  HPI: TEMIMA SEGARRA is a 74 y.o. female  Chief Complaint  Patient presents with  . Hypertension   Here today with concerns over her BPs. Tried 2.5 mg amlodipine but it didn't seem strong enough so now taking twice daily. Noting having posterior headaches when BPs are high. BPs have been staying 160s/100s or sometimes a bit higher. This causes increased anxiety for her which perpetuates things. Highest reading was 220/110 which she went to the ER for her. Still dealing with her flank pain for which she's seen numerous specialists since 08/2019 for and notes this has really increased her anxiety in general. Taking xanax rarely as needed to calm things down. Seeing Physiatry next week to discuss possible muscular causes for her flank pain which she is hopeful can give her some relief. Denies CP, SOB, visual changes, dizziness, syncope, extremity weakness. Per record review from recent ER visits, numerous blood tests have been run with normal findings, including autoimmune testing and renal function.   Relevant past medical, surgical, family and social history reviewed and updated as indicated. Interim medical history since our last visit reviewed. Allergies and medications reviewed and updated.  Review of Systems  Per HPI unless specifically indicated above     Objective:    BP 127/75   Pulse 78   Temp 98.5 F (36.9 C) (Oral)   Ht 5\' 7"  (1.702 m)   Wt 152 lb (68.9 kg)   LMP  (LMP Unknown)   SpO2 97%   BMI 23.81 kg/m   Wt Readings from Last 3 Encounters:  12/26/19 152 lb (68.9 kg)  12/11/19 150 lb (68 kg)  11/08/19 148 lb (67.1 kg)    Physical Exam Vitals and nursing note reviewed.  Constitutional:      Appearance: Normal appearance.  She is not ill-appearing.  HENT:     Head: Atraumatic.  Eyes:     Extraocular Movements: Extraocular movements intact.     Conjunctiva/sclera: Conjunctivae normal.  Cardiovascular:     Rate and Rhythm: Normal rate and regular rhythm.     Heart sounds: Normal heart sounds.  Pulmonary:     Effort: Pulmonary effort is normal.     Breath sounds: Normal breath sounds.  Musculoskeletal:        General: Normal range of motion.     Cervical back: Normal range of motion and neck supple.  Skin:    General: Skin is warm and dry.  Neurological:     Mental Status: She is alert and oriented to person, place, and time.  Psychiatric:        Thought Content: Thought content normal.        Judgment: Judgment normal.     Comments: anxious     Results for orders placed or performed during the hospital encounter of [id number]  Basic metabolic panel  Result Value Ref Range   Sodium 142 135 - 145 mmol/L   Potassium 3.7 3.5 - 5.1 mmol/L   Chloride 106 98 - 111 mmol/L   CO2 24 22 - 32 mmol/L   Glucose, Bld 100 (H) 70 -  99 mg/dL   BUN 9 8 - 23 mg/dL   Creatinine, Ser 0.62 0.44 - 1.00 mg/dL   Calcium 8.9 8.9 - 10.3 mg/dL   GFR calc non Af Amer >60 >60 mL/min   GFR calc Af Amer >60 >60 mL/min   Anion gap 12 5 - 15  CBC  Result Value Ref Range   WBC 5.6 4.0 - 10.5 K/uL   RBC 4.50 3.87 - 5.11 MIL/uL   Hemoglobin 14.2 12.0 - 15.0 g/dL   HCT 41.3 36.0 - 46.0 %   MCV 91.8 80.0 - 100.0 fL   MCH 31.6 26.0 - 34.0 pg   MCHC 34.4 30.0 - 36.0 g/dL   RDW 12.4 11.5 - 15.5 %   Platelets 195 150 - 400 K/uL   nRBC 0.0 0.0 - 0.2 %  Troponin I (High Sensitivity)  Result Value Ref Range   Troponin I (High Sensitivity) 3 <18 ng/L      Assessment & Plan:   Problem List Items Addressed This Visit      Cardiovascular and Mediastinum   Essential hypertension - Primary    Will increase amlodipine to 7.5 mg daily, 5 mg in AM and adding the 2.5 mg dose in afternoon if BPs elevated. Continue metoprolol. Normal  BP ranges and emergent range +sxs reviewed. Has f/u next week with Cardiologist, will discuss BPs with them as well at this time once already on new dose for a week.       Relevant Medications   amLODipine (NORVASC) 5 MG tablet     Other   Chronic anxiety    Exacerbated by health concerns. Reassurance given today, declines medication. Continue to monitor, work on thought redirection and breathing techniques       Other Visit Diagnoses    Right flank pain       Ongoing but notes less symptomatic than initially. Has seen numerous specialists at this point, await Physiatry consult next week       Follow up plan: Return in about 2 weeks (around 01/09/2020) for HTN if needed based on Cardiology f/u.

## 2019-12-26 NOTE — Assessment & Plan Note (Signed)
Exacerbated by health concerns. Reassurance given today, declines medication. Continue to monitor, work on thought redirection and breathing techniques

## 2020-01-02 DIAGNOSIS — I700 Atherosclerosis of aorta: Secondary | ICD-10-CM | POA: Insufficient documentation

## 2020-01-04 ENCOUNTER — Ambulatory Visit: Payer: Medicare PPO | Admitting: Family Medicine

## 2020-01-04 ENCOUNTER — Other Ambulatory Visit: Payer: Self-pay | Admitting: Family Medicine

## 2020-01-04 ENCOUNTER — Encounter: Payer: Self-pay | Admitting: Family Medicine

## 2020-01-04 ENCOUNTER — Other Ambulatory Visit: Payer: Self-pay

## 2020-01-04 VITALS — BP 138/81 | HR 74 | Temp 99.0°F | Ht 66.0 in | Wt 151.0 lb

## 2020-01-04 DIAGNOSIS — R519 Headache, unspecified: Secondary | ICD-10-CM | POA: Diagnosis not present

## 2020-01-04 DIAGNOSIS — S41111A Laceration without foreign body of right upper arm, initial encounter: Secondary | ICD-10-CM

## 2020-01-04 MED ORDER — TRAMADOL HCL 50 MG PO TABS: 50.0000 mg | ORAL_TABLET | Freq: Three times a day (TID) | ORAL | 0 refills | Status: DC | PRN

## 2020-01-04 MED ORDER — TRAMADOL HCL 50 MG PO TABS: 50.0000 mg | ORAL_TABLET | Freq: Three times a day (TID) | ORAL | 0 refills | Status: AC | PRN

## 2020-01-04 MED ORDER — SULFAMETHOXAZOLE-TRIMETHOPRIM 800-160 MG PO TABS: 1.0000 | ORAL_TABLET | Freq: Two times a day (BID) | ORAL | 0 refills | Status: DC

## 2020-01-04 NOTE — Progress Notes (Signed)
BP 138/81   Pulse 74   Temp 99 F (37.2 C) (Oral)   Ht 5\' 6"  (1.676 m)   Wt 151 lb (68.5 kg)   LMP  (LMP Unknown)   SpO2 98%   BMI 24.37 kg/m    Subjective:    Patient ID: Savannah [name], female    DOB: [birth year], 74 y.o.   MRN: [id number]  HPI: Savannah [name] is a 74 y.o. female  Chief Complaint  Patient presents with  . Wound Check    pts dog scratched her rigth forearm on Monday night. states its painful, red and swollen   Patient presenting today with a dog scratch wound to right forearm that happened 4 days ago. Having tenderness, redness, and mild swelling around the area and concerned for infection. No fevers chills, sweats, body aches. Has been keeping area clean and applying neosporin and gauze.   Daily headache at top of head the past few weeks. Had this in the past with a different statin and now wondering if her lipitor is causing this. BPs have been normal throughout the past week or so with no improvement in headaches. Denies visual changes, neck soreness, confusion.   Relevant past medical, surgical, family and social history reviewed and updated as indicated. Interim medical history since our last visit reviewed. Allergies and medications reviewed and updated.  Review of Systems  Per HPI unless specifically indicated above     Objective:    BP 138/81   Pulse 74   Temp 99 F (37.2 C) (Oral)   Ht 5\' 6"  (1.676 m)   Wt 151 lb (68.5 kg)   LMP  (LMP Unknown)   SpO2 98%   BMI 24.37 kg/m   Wt Readings from Last 3 Encounters:  01/04/20 151 lb (68.5 kg)  12/26/19 152 lb (68.9 kg)  12/11/19 150 lb (68 kg)    Physical Exam Vitals and nursing note reviewed.  Constitutional:      Appearance: Normal appearance. She is not ill-appearing.  HENT:     Head: Atraumatic.  Eyes:     Extraocular Movements: Extraocular movements intact.     Conjunctiva/sclera: Conjunctivae normal.  Cardiovascular:     Rate and Rhythm: Normal rate and regular rhythm.     Heart sounds:  Normal heart sounds.  Pulmonary:     Effort: Pulmonary effort is normal.     Breath sounds: Normal breath sounds.  Musculoskeletal:        General: Normal range of motion.     Cervical back: Normal range of motion and neck supple.  Skin:    General: Skin is warm.     Comments: 4 cm laceration to right forearm with 1 cm x 0.5 cm area of subcutaneous exposure at proximal end of laceration Mild erythema and edema at wound edges. No drainage  Neurological:     Mental Status: She is alert and oriented to person, place, and time.  Psychiatric:        Mood and Affect: Mood normal.        Thought Content: Thought content normal.        Judgment: Judgment normal.     Results for orders placed or performed during the hospital encounter of [id number]  Basic metabolic panel  Result Value Ref Range   Sodium 142 135 - 145 mmol/L   Potassium 3.7 3.5 - 5.1 mmol/L   Chloride 106 98 - 111 mmol/L   CO2 24 22 - 32 mmol/L  Glucose, Bld 100 (H) 70 - 99 mg/dL   BUN 9 8 - 23 mg/dL   Creatinine, Ser 0.62 0.44 - 1.00 mg/dL   Calcium 8.9 8.9 - 10.3 mg/dL   GFR calc non Af Amer >60 >60 mL/min   GFR calc Af Amer >60 >60 mL/min   Anion gap 12 5 - 15  CBC  Result Value Ref Range   WBC 5.6 4.0 - 10.5 K/uL   RBC 4.50 3.87 - 5.11 MIL/uL   Hemoglobin 14.2 12.0 - 15.0 g/dL   HCT 41.3 36.0 - 46.0 %   MCV 91.8 80.0 - 100.0 fL   MCH 31.6 26.0 - 34.0 pg   MCHC 34.4 30.0 - 36.0 g/dL   RDW 12.4 11.5 - 15.5 %   Platelets 195 150 - 400 K/uL   nRBC 0.0 0.0 - 0.2 %  Troponin I (High Sensitivity)  Result Value Ref Range   Troponin I (High Sensitivity) 3 <18 ng/L      Assessment & Plan:   Problem List Items Addressed This Visit    None    Visit Diagnoses    Arm laceration, right, initial encounter    -  Primary   Dressed wound with neosporin, nonstick gauze and paper tape. Home wound care reviewed with return precautions. Bactrim, prn tramadol sent   Nonintractable episodic headache, unspecified headache  type       Trial 4-6 week holiday from statin, tylenol prn. F/u if not improving or if worsening   Relevant Medications   traMADol (ULTRAM) 50 MG tablet       Follow up plan: Return in about 1 week (around 01/11/2020) for wound check if not resolving.

## 2020-01-14 ENCOUNTER — Encounter: Payer: Self-pay | Admitting: Family Medicine

## 2020-01-14 DIAGNOSIS — Z13820 Encounter for screening for osteoporosis: Secondary | ICD-10-CM

## 2020-01-14 MED ORDER — CYCLOBENZAPRINE HCL 10 MG PO TABS: 10.0000 mg | ORAL_TABLET | Freq: Three times a day (TID) | ORAL | 0 refills | Status: DC | PRN

## 2020-01-14 NOTE — Telephone Encounter (Signed)
Pt called in and ask if Ms. Lane could schedule her a bone density test.

## 2020-01-15 ENCOUNTER — Ambulatory Visit: Payer: Medicare Other

## 2020-01-23 ENCOUNTER — Ambulatory Visit
Admission: RE | Admit: 2020-01-23 | Discharge: 2020-01-23 | Disposition: A | Discharge status: Home / Self Care | Payer: Medicare PPO | Source: Ambulatory Visit | Attending: Family Medicine | Admitting: Family Medicine

## 2020-01-23 DIAGNOSIS — Z13820 Encounter for screening for osteoporosis: Secondary | ICD-10-CM | POA: Insufficient documentation

## 2020-01-23 DIAGNOSIS — Z780 Asymptomatic menopausal state: Secondary | ICD-10-CM | POA: Insufficient documentation

## 2020-01-24 ENCOUNTER — Ambulatory Visit: Payer: Medicare PPO

## 2020-01-28 ENCOUNTER — Telehealth: Payer: Self-pay | Admitting: Family Medicine

## 2020-01-28 NOTE — Telephone Encounter (Signed)
Patient notified

## 2020-01-28 NOTE — Telephone Encounter (Signed)
Please let her know that her bone density is the same as it was last time. Keep taking the boniva and we'll repeat it in 3 years. Thanks!

## 2020-02-01 ENCOUNTER — Ambulatory Visit: Payer: Medicare Other

## 2020-02-05 ENCOUNTER — Other Ambulatory Visit: Payer: Self-pay | Admitting: Family Medicine

## 2020-02-05 DIAGNOSIS — M810 Age-related osteoporosis without current pathological fracture: Secondary | ICD-10-CM

## 2020-02-13 ENCOUNTER — Other Ambulatory Visit: Payer: Self-pay | Admitting: Family Medicine

## 2020-02-13 ENCOUNTER — Encounter: Payer: Self-pay | Admitting: Family Medicine

## 2020-02-13 DIAGNOSIS — M810 Age-related osteoporosis without current pathological fracture: Secondary | ICD-10-CM

## 2020-02-13 MED ORDER — IBANDRONATE SODIUM 150 MG PO TABS: ORAL_TABLET | ORAL | 1 refills | Status: DC

## 2020-03-03 ENCOUNTER — Other Ambulatory Visit (HOSPITAL_COMMUNITY): Payer: Self-pay | Admitting: Student

## 2020-03-03 ENCOUNTER — Other Ambulatory Visit: Payer: Self-pay | Admitting: Student

## 2020-03-03 DIAGNOSIS — K7689 Other specified diseases of liver: Secondary | ICD-10-CM

## 2020-03-04 ENCOUNTER — Other Ambulatory Visit: Payer: Self-pay | Admitting: Family Medicine

## 2020-03-04 NOTE — Telephone Encounter (Signed)
Appointment 05/12/20- refilled per protocol

## 2020-03-04 NOTE — Telephone Encounter (Signed)
Requested Prescriptions  Pending Prescriptions Disp Refills  . metoprolol succinate (TOPROL-XL) 50 MG 24 hr tablet [Pharmacy Med Name: METOPROLOL SUCC ER 50 MG TAB] 45 tablet 0    Sig: Take 1/2 tablet (25 mg) daily with or immediately following a meal.     Cardiovascular:  Beta Blockers Passed - 03/04/2020 11:02 AM      Passed - Last BP in normal range    BP Readings from Last 1 Encounters:  01/04/20 138/81         Passed - Last Heart Rate in normal range    Pulse Readings from Last 1 Encounters:  01/04/20 74         Passed - Valid encounter within last 6 months    Recent Outpatient Visits          2 months ago Arm laceration, right, initial encounter   [facility] Volney American, [place]   2 months ago Essential hypertension   [place], [place], [place]   4 months ago Pleural effusion   [place], [place], [place]   4 months ago Right flank pain   [place], [place]   5 months ago Right flank pain   [place], Lilia Argue, [place]      Future Appointments            In 2 months  [employer], [place]   In 2 months [place], Lilia Argue, [place], [place]

## 2020-03-10 ENCOUNTER — Ambulatory Visit: Payer: Medicare PPO

## 2020-03-17 ENCOUNTER — Ambulatory Visit
Admission: RE | Admit: 2020-03-17 | Discharge: 2020-03-17 | Disposition: A | Discharge status: Home / Self Care | Payer: Medicare PPO | Source: Ambulatory Visit | Attending: Student | Admitting: Student

## 2020-03-17 ENCOUNTER — Other Ambulatory Visit: Payer: Self-pay

## 2020-03-17 DIAGNOSIS — K7689 Other specified diseases of liver: Secondary | ICD-10-CM | POA: Insufficient documentation

## 2020-03-31 ENCOUNTER — Other Ambulatory Visit: Payer: Self-pay | Admitting: Family Medicine

## 2020-03-31 NOTE — Telephone Encounter (Signed)
Requested Prescriptions  Pending Prescriptions Disp Refills  . levothyroxine (SYNTHROID) 75 MCG tablet [Pharmacy Med Name: LEVOTHYROXINE 75 MCG TABLET] 90 tablet 0    Sig: Take 1 tablet (75 mcg total) by mouth daily before breakfast.     Endocrinology:  Hypothyroid Agents Failed - 03/31/2020 11:09 AM      Failed - TSH needs to be rechecked within 3 months after an abnormal result. Refill until TSH is due.      Passed - TSH in normal range and within 360 days    TSH  Date Value Ref Range Status  05/04/2019 3.080 0.450 - 4.500 uIU/mL Final         Passed - Valid encounter within last 12 months    Recent Outpatient Visits          2 months ago Arm laceration, right, initial encounter   [facility] Volney American, [place]   3 months ago Essential hypertension   [place], [place], [place]   5 months ago Pleural effusion   [place], [place], [place]   5 months ago Right flank pain   [place], [place]   6 months ago Right flank pain   [place], Lilia Argue, [place]      Future Appointments            In 3 weeks [facility] [place], Herbert Seta, MD [place]   In 1 month  [facility], [place]   In 1 month [place], Lilia Argue, [place], [place]

## 2020-04-23 ENCOUNTER — Other Ambulatory Visit: Payer: Self-pay

## 2020-04-23 ENCOUNTER — Ambulatory Visit (INDEPENDENT_AMBULATORY_CARE_PROVIDER_SITE_OTHER): Payer: Medicare PPO | Admitting: Obstetrics and Gynecology

## 2020-04-23 ENCOUNTER — Ambulatory Visit: Payer: Medicare PPO | Admitting: Urology

## 2020-04-23 ENCOUNTER — Encounter: Payer: Self-pay | Admitting: Obstetrics and Gynecology

## 2020-04-23 ENCOUNTER — Encounter: Payer: Self-pay | Admitting: Urology

## 2020-04-23 VITALS — BP 148/82 | HR 83 | Ht 66.0 in | Wt 152.0 lb

## 2020-04-23 VITALS — BP 128/86 | Ht 66.0 in | Wt 152.0 lb

## 2020-04-23 DIAGNOSIS — Z124 Encounter for screening for malignant neoplasm of cervix: Secondary | ICD-10-CM

## 2020-04-23 DIAGNOSIS — Z1339 Encounter for screening examination for other mental health and behavioral disorders: Secondary | ICD-10-CM | POA: Diagnosis not present

## 2020-04-23 DIAGNOSIS — Z1331 Encounter for screening for depression: Secondary | ICD-10-CM

## 2020-04-23 DIAGNOSIS — D179 Benign lipomatous neoplasm, unspecified: Secondary | ICD-10-CM | POA: Diagnosis not present

## 2020-04-23 DIAGNOSIS — Z01419 Encounter for gynecological examination (general) (routine) without abnormal findings: Secondary | ICD-10-CM

## 2020-04-23 DIAGNOSIS — N763 Subacute and chronic vulvitis: Secondary | ICD-10-CM

## 2020-04-23 MED ORDER — TRIAMCINOLONE ACETONIDE 0.1 % EX CREA: 1.0000 "application " | TOPICAL_CREAM | Freq: Two times a day (BID) | CUTANEOUS | 1 refills | Status: DC

## 2020-04-23 NOTE — Progress Notes (Signed)
04/23/20 12:59 PM   Savannah [name] 06/28/1946 [id number]  CC: Right AML  HPI: I saw Savannah [name] in urology clinic today for evaluation of a small 6 mm renal cyst versus AML seen on recent renal ultrasound.  She is a 74 year old relatively healthy female who had 6 months of severe right-sided flank pain that has since resolved after physical therapy and has had extensive work-up.  A CT abdomen pelvis with contrast was performed in October 2020 and showed no hydronephrosis, urolithiasis, or significant renal lesions.  A recent ultrasound of the abdomen to follow-up the liver cyst suggested a possible 6 mm lesion in the superior pole of the right kidney that may represent either a small cyst or a small incidental angiomyolipoma.  She denies any gross hematuria.  Her flank pain has almost completely resolved from this fall.  She denies any family history of any urologic malignancies.  She is a non-smoker and denies any other carcinogenic exposures.  She does have a strong family history of colon cancer.  She denies any weight loss or weight gain.  There is no history of gross hematuria.   PMH: Past Medical History:  Diagnosis Date  . Anxiety   . Cancer (HCC)    skin- squamous  . HOH (hard of hearing)    WEARS AIDS  . Hyperlipidemia   . Hypertension    CONTROLLED ON MEDS  . Hypothyroidism   . IBS (irritable bowel syndrome)   . MVP (mitral valve prolapse)   . OA (osteoarthritis) of neck    JOINTS  . Osteoporosis   . Thyroid disease     Surgical History: Past Surgical History:  Procedure Laterality Date  . ANKLE FRACTURE SURGERY Left   . BREAST CYST ASPIRATION Right 90s  . COLONOSCOPY WITH PROPOFOL N/A 04/12/2016   Procedure: COLONOSCOPY WITH PROPOFOL;  Surgeon: Manya Silvas, MD;  Location: [facility] ENDOSCOPY;  Service: Endoscopy;  Laterality: N/A;  . FRACTURE SURGERY     ANKLE  . HAMMER TOE SURGERY Right 02/08/2018   Procedure: HAMMER TOE CORRECTION-5TH TOE, Excision soft tissue mass  dorsal lateral right fifth toe and exostectomy dorsal lateral DIPJ fifth toe;  Surgeon: Samara Deist, DPM;  Location: [place];  Service: Podiatry;  Laterality: Right;  IVA LOCAL  . moses surgery  09/2015  . SINUS SURGERY WITH INSTATRAK  10/2017   DEVIATED SEPTUM  . 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 Cancer Mother        colon  . Hypertension Mother   . Cancer Father        lung  . Hyperlipidemia Brother   . Breast cancer Neg Hx     Social History:  reports that she quit smoking about 51 years ago. Her smoking use included cigarettes. She has a 4.00 pack-year smoking history. She has never used smokeless tobacco. She reports current alcohol use of about 10.0 standard drinks of alcohol per week. She reports that she does not use drugs.  Physical Exam: LMP  (LMP Unknown)    Constitutional:  Alert and oriented, No acute distress. Cardiovascular: No clubbing, cyanosis, or edema. Respiratory: Normal respiratory effort, no increased work of breathing. GI: Abdomen is soft, nontender, nondistended, no abdominal masses GU: No CVA tenderness  Laboratory Data: Reviewed, see HPI  Pertinent Imaging: I have personally reviewed the prior CT and renal ultrasounds, there is a very small <  1 cm lesion in the right kidney that likely represents either an early cyst or a small angiomyolipoma.  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  We discussed that an AML is a benign lesion that does not require biopsy or excision unless they are enlarged significantly and risk for bleeding.  I recommended repeating an ultrasound in 9 to 12 months, and if stable no further imaging or follow-up as needed.  We discussed return precautions including gross hematuria or recurrence of severe flank pain.  RTC 9 months  with renal ultrasound  I spent 45 total minutes on the day of the encounter including pre-visit review of the medical record, face-to-face time with the patient, and post visit ordering of labs/imaging/tests.  Nickolas Madrid, MD 04/23/2020  [facility] [address], [place], [place] [address] [phone]

## 2020-04-23 NOTE — Progress Notes (Signed)
Routine Annual Gynecology Examination   PCP: Volney American, PA-C  Chief Complaint  Patient presents with  . Annual Exam   History of Present Illness: Patient is a 74 y.o. presents for annual exam. The patient has no complaints today.   Menopausal bleeding: denies  Menopausal symptoms: denies  Breast symptoms: denies  Last pap smear: 2 years ago.  Result Normal  Last mammogram: 07/20/2019. Result Normal   She took the treatment of steroids for a while and uses it intermittently for symptoms, as well as Vaseline.  She has intermittent symptoms.  Overall, she states that she is much improved ("90%").    Last colonoscopy: 11/2019, was told that she does not need another colonoscopy.  She will likely get another in 5 years.   Past Medical History:  Diagnosis Date  . Anxiety   . Cancer (HCC)    skin- squamous  . HOH (hard of hearing)    WEARS AIDS  . Hyperlipidemia   . Hypertension    CONTROLLED ON MEDS  . Hypothyroidism   . IBS (irritable bowel syndrome)   . MVP (mitral valve prolapse)   . OA (osteoarthritis) of neck    JOINTS  . Osteoporosis   . Thyroid disease     Past Surgical History:  Procedure Laterality Date  . ANKLE FRACTURE SURGERY Left   . BREAST CYST ASPIRATION Right 90s  . COLONOSCOPY WITH PROPOFOL N/A 04/12/2016   Procedure: COLONOSCOPY WITH PROPOFOL;  Surgeon: Manya Silvas, MD;  Location[facility] ENDOSCOPY;  Service: Endoscopy;  Laterality: N/A;  . FRACTURE SURGERY     ANKLE  . HAMMER TOE SURGERY Right 02/08/2018   Procedure: HAMMER TOE CORRECTION-5TH TOE, Excision soft tissue mass dorsal lateral right fifth toe and exostectomy dorsal lateral DIPJ fifth toe;  Surgeon: Samara Deist, DPM;  Location: [place];  Service: Podiatry;  Laterality: Right;  IVA LOCAL  . moses surgery  09/2015  . SINUS SURGERY WITH INSTATRAK  10/2017   DEVIATED SEPTUM  . 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 Authorizing Provider  ALPRAZolam (XANAX) 0.25 MG tablet Take 1 tablet (0.25 mg total) by mouth at bedtime as needed for anxiety. 05/03/19   Volney American, PA-C  amLODipine (NORVASC) 2.5 MG tablet Take 1 tablet (2.5 mg total) by mouth daily. 12/18/19   Volney American, PA-C  amLODipine (NORVASC) 5 MG tablet Take 1 tablet (5 mg total) by mouth daily. To be taken alongside the 2.5 mg dose daily 12/26/19   Volney American, PA-C  atorvastatin (LIPITOR) 20 MG tablet Take 1 tablet (20 mg total) by mouth daily. AM 05/03/19   Volney American, PA-C  cyclobenzaprine (FLEXERIL) 10 MG tablet Take 1 tablet (10 mg total) by mouth 3 (three) times daily as needed for muscle spasms. 01/14/20   Johnson, Megan P, DO  ibandronate (BONIVA) 150 MG tablet Take one tab. every 30 days,Take in the am with a 8oz glass of water,an empty stomach,nothing by mouth for 30 min, dont lie down for 30 min 02/13/20   Volney American, PA-C  levothyroxine (SYNTHROID) 75 MCG tablet Take 1 tablet (75 mcg total) by mouth daily before breakfast. 03/31/20   Volney American, PA-C  metoprolol succinate (TOPROL-XL) 50 MG 24 hr tablet Take 1/2 tablet (25 mg) daily with or immediately following a meal. 03/04/20   Volney American, PA-C  scopolamine (TRANSDERM-SCOP, 1.5 MG,) 1 MG/3DAYS Place 1 patch (1.5 mg total) onto the skin every 3 (three) days. 05/03/19   Volney American, PA-C  sulfamethoxazole-trimethoprim (BACTRIM DS) 800-160 MG tablet Take 1 tablet by mouth 2 (two) times daily. 01/04/20   Volney American, PA-C    Allergies  Allergen Reactions  . Amoxil [Amoxicillin] Itching  . Penicillins Other (See Comments)    "passed out"    Social History   Socioeconomic History  . Marital status: Married    Spouse name: Not on file  . Number of children: Not on file  . Years of education: Not on file  . Highest education level:  Master's degree (e.g., MA, MS, MEng, MEd, MSW, MBA)  Occupational History  . Occupation: retired  Tobacco Use  . Smoking status: Former Smoker    Packs/day: 1.00    Years: 4.00    Pack years: 4.00    Types: Cigarettes    Quit date: 1970    Years since quitting: 51.3  . Smokeless tobacco: Never Used  Substance and Sexual Activity  . Alcohol use: Yes    Alcohol/week: 10.0 standard drinks    Types: 10 Glasses of wine per week  . Drug use: No  . Sexual activity: Yes  Other Topics Concern  . Not on file  Social History Narrative  . Not on file   Social Determinants of Health   Financial Resource Strain:   . Difficulty of Paying Living Expenses:   Food Insecurity:   . Worried About Charity fundraiser in the Last Year:   . Arboriculturist in the Last Year:   Transportation Needs:   . Film/video editor (Medical):   Marland Kitchen Lack of Transportation (Non-Medical):   Physical Activity:   . Days of Exercise per Week:   . Minutes of Exercise per Session:   Stress:   . Feeling of Stress :   Social Connections:   . Frequency of Communication with Friends and Family:   . Frequency of Social Gatherings with Friends and Family:   . Attends Religious Services:   . Active Member of Clubs or Organizations:   . Attends Archivist Meetings:   Marland Kitchen Marital Status:   Intimate Partner Violence:   . Fear of Current or Ex-Partner:   . Emotionally Abused:   Marland Kitchen Physically Abused:   . Sexually Abused:     Family History  Problem Relation Age of Onset  . Cancer Mother        colon  . Hypertension Mother   . Cancer Father        lung  . Hyperlipidemia Brother   . Breast cancer Neg Hx     Review of Systems  Constitutional: Negative.   HENT: Negative.   Eyes: Negative.   Respiratory: Negative.   Cardiovascular: Negative.   Gastrointestinal: Negative.   Genitourinary: Negative.   Musculoskeletal: Negative.   Skin: Negative.   Neurological: Negative.   Psychiatric/Behavioral:  Negative.      Physical Exam Vitals: BP 128/86   Ht 5\' 6"  (1.676 m)   Wt 152 lb (68.9 kg)   LMP  (LMP Unknown)   BMI 24.53 kg/m   Physical Exam Constitutional:      General: She is not in acute distress.    Appearance: Normal appearance. She is well-developed.  Genitourinary:     Pelvic exam was performed with patient in the lithotomy position.     Vulva, urethra, bladder and  uterus normal.     No inguinal adenopathy present in the right or left side.       No signs of injury in the vagina.     No vaginal discharge, erythema, tenderness or bleeding.     No cervical motion tenderness, discharge, lesion or polyp.     Uterus is mobile.     Uterus is not enlarged or tender.     No uterine mass detected.    Uterus is anteverted.     No right or left adnexal mass present.     Right adnexa not tender or full.     Left adnexa not tender or full.  HENT:     Head: Normocephalic and atraumatic.  Eyes:     General: No scleral icterus.    Conjunctiva/sclera: Conjunctivae normal.  Neck:     Thyroid: No thyromegaly.  Cardiovascular:     Rate and Rhythm: Normal rate and regular rhythm.     Heart sounds: No murmur. No friction rub. No gallop.   Pulmonary:     Effort: Pulmonary effort is normal. No respiratory distress.     Breath sounds: Normal breath sounds. No wheezing or rales.  Chest:     Breasts:        Right: No inverted nipple, mass, nipple discharge, skin change or tenderness.        Left: No inverted nipple, mass, nipple discharge, skin change or tenderness.  Abdominal:     General: Bowel sounds are normal. There is no distension.     Palpations: Abdomen is soft. There is no mass.     Tenderness: There is no abdominal tenderness. There is no guarding or rebound.  Musculoskeletal:        General: No swelling or tenderness. Normal range of motion.     Cervical back: Normal range of motion and neck supple.  Lymphadenopathy:     Cervical: No cervical adenopathy.     Lower  Body: No right inguinal adenopathy. No left inguinal adenopathy.  Neurological:     General: No focal deficit present.     Mental Status: She is alert and oriented to person, place, and time.     Cranial Nerves: No cranial nerve deficit.  Skin:    General: Skin is warm and dry.     Findings: No erythema or rash.  Psychiatric:        Mood and Affect: Mood normal.        Behavior: Behavior normal.        Judgment: Judgment normal.      Female chaperone present for pelvic and breast  portions of the physical exam  Results: AUDIT Questionnaire (screen for alcoholism): 4 PHQ-9: 0   Assessment and Plan:  74 y.o. No obstetric history on file. female here for routine annual gynecologic examination  Plan: Problem List Items Addressed This Visit    None    Visit Diagnoses    Women's annual routine gynecological examination    -  Primary   Screening for depression       Screening for alcoholism       Pap smear for cervical cancer screening       Chronic vulvitis       Relevant Medications   triamcinolone cream (KENALOG) 0.1 %      Screening: -- Blood pressure screen normal -- Colonoscopy - not due -- Mammogram - not due -- Weight screening: normal -- Depression screening negative (PHQ-9) -- Nutrition: normal -- cholesterol  screening: per PCP -- osteoporosis screening: per PCP -- tobacco screening: not using -- alcohol screening: AUDIT questionnaire indicates low-risk usage. -- family history of breast cancer screening: done. not at high risk. -- no evidence of domestic violence or intimate partner violence. -- STD screening: gonorrhea/chlamydia NAAT not collected per patient request. -- pap smear collected per ASCCP guidelines. Sent to [facility].   Chronic Vulvitis: restart steroid taper.  Will follow up in 6 months.   Prentice Docker, MD 04/23/2020 9:51 AM

## 2020-04-28 ENCOUNTER — Encounter: Payer: Self-pay | Admitting: Family Medicine

## 2020-04-30 ENCOUNTER — Other Ambulatory Visit: Payer: Self-pay | Admitting: Family Medicine

## 2020-04-30 DIAGNOSIS — N763 Subacute and chronic vulvitis: Secondary | ICD-10-CM

## 2020-05-01 ENCOUNTER — Encounter: Payer: Self-pay | Admitting: Obstetrics and Gynecology

## 2020-05-12 ENCOUNTER — Other Ambulatory Visit: Payer: Self-pay

## 2020-05-12 ENCOUNTER — Ambulatory Visit (INDEPENDENT_AMBULATORY_CARE_PROVIDER_SITE_OTHER): Payer: Medicare PPO

## 2020-05-12 ENCOUNTER — Ambulatory Visit (INDEPENDENT_AMBULATORY_CARE_PROVIDER_SITE_OTHER): Payer: Medicare PPO | Admitting: Family Medicine

## 2020-05-12 VITALS — BP 123/78 | HR 64 | Temp 97.6°F | Ht 66.0 in | Wt 152.6 lb

## 2020-05-12 DIAGNOSIS — I10 Essential (primary) hypertension: Secondary | ICD-10-CM | POA: Diagnosis not present

## 2020-05-12 DIAGNOSIS — K219 Gastro-esophageal reflux disease without esophagitis: Secondary | ICD-10-CM | POA: Diagnosis not present

## 2020-05-12 DIAGNOSIS — Z0000 Encounter for general adult medical examination without abnormal findings: Secondary | ICD-10-CM

## 2020-05-12 DIAGNOSIS — E7800 Pure hypercholesterolemia, unspecified: Secondary | ICD-10-CM

## 2020-05-12 DIAGNOSIS — M810 Age-related osteoporosis without current pathological fracture: Secondary | ICD-10-CM | POA: Diagnosis not present

## 2020-05-12 DIAGNOSIS — F419 Anxiety disorder, unspecified: Secondary | ICD-10-CM

## 2020-05-12 DIAGNOSIS — Z1231 Encounter for screening mammogram for malignant neoplasm of breast: Secondary | ICD-10-CM

## 2020-05-12 DIAGNOSIS — E039 Hypothyroidism, unspecified: Secondary | ICD-10-CM | POA: Diagnosis not present

## 2020-05-12 LAB — UA/M W/RFLX CULTURE, ROUTINE
Bilirubin, UA: NEGATIVE
Glucose, UA: NEGATIVE
Ketones, UA: NEGATIVE
Leukocytes,UA: NEGATIVE
Nitrite, UA: NEGATIVE
Protein,UA: NEGATIVE
Specific Gravity, UA: 1.015 (ref 1.005–1.030)
Urobilinogen, Ur: 0.2 mg/dL (ref 0.2–1.0)
pH, UA: 5.5 (ref 5.0–7.5)

## 2020-05-12 LAB — MICROSCOPIC EXAMINATION
Bacteria, UA: NONE SEEN
WBC, UA: NONE SEEN /hpf (ref 0–5)

## 2020-05-12 MED ORDER — METOPROLOL SUCCINATE ER 50 MG PO TB24: ORAL_TABLET | ORAL | 1 refills | Status: DC

## 2020-05-12 MED ORDER — LOVASTATIN 20 MG PO TABS: ORAL_TABLET | ORAL | 1 refills | Status: DC

## 2020-05-12 MED ORDER — ALPRAZOLAM 0.25 MG PO TABS: 0.2500 mg | ORAL_TABLET | Freq: Every evening | ORAL | 0 refills | Status: DC | PRN

## 2020-05-12 MED ORDER — LEVOTHYROXINE SODIUM 75 MCG PO TABS: 75.0000 ug | ORAL_TABLET | Freq: Every day | ORAL | 1 refills | Status: DC

## 2020-05-12 NOTE — Progress Notes (Signed)
Subjective:   DENEAN SAMAND is a 74 y.o. female who presents for Medicare Annual (Subsequent) preventive examination.  Review of Systems:   Cardiac Risk Factors include: advanced age (>26men, >9 women);dyslipidemia;hypertension     Objective:     Vitals: BP 123/78 (BP Location: Left Arm, Patient Position: Sitting, Cuff Size: Normal)   Pulse 64   Temp 97.6 F (36.4 C) (Temporal)   Ht 5\' 6"  (1.676 m)   Wt 152 lb 9.6 oz (69.2 kg)   LMP  (LMP Unknown)   BMI 24.63 kg/m   Body mass index is 24.63 kg/m.  Advanced Directives 05/12/2020 12/11/2019 05/01/2018 02/08/2018 04/17/2017 04/15/2017 04/13/2016  Does Patient Have a Medical Advance Directive? Yes No Yes Yes Yes Yes Yes  Type of Advance Directive Living will;Healthcare Power of [place];Living will [place];Living will [place];Living will [place];Living will [place];Living will  Does patient want to make changes to medical advance directive? - - - - No - Patient declined - -  Copy of [place] in Chart? No - copy requested - No - copy requested Yes No - copy requested - -  Would patient like information on creating a medical advance directive? - No - Patient declined - - - - -    Tobacco Social History   Tobacco Use  Smoking Status Former Smoker  . Packs/day: 1.00  . Years: 4.00  . Pack years: 4.00  . Types: Cigarettes  . Quit date: 25  . Years since quitting: 51.4  Smokeless Tobacco Never Used     Counseling given: Not Answered   Clinical Intake:  Pre-visit preparation completed: Yes  Pain : No/denies pain     Nutritional Risks: None Diabetes: No  How often do you need to have someone help you when you read instructions, pamphlets, or other written materials from your doctor or pharmacy?: 1 - Never  Interpreter Needed?: No  Information entered by :: Wagner Tanzi,LPN  Past  Medical History:  Diagnosis Date  . Anxiety   . Cancer (HCC)    skin- squamous  . HOH (hard of hearing)    WEARS AIDS  . Hyperlipidemia   . Hypertension    CONTROLLED ON MEDS  . Hypothyroidism   . IBS (irritable bowel syndrome)   . MVP (mitral valve prolapse)   . OA (osteoarthritis) of neck    JOINTS  . Osteoporosis   . Thyroid disease    Past Surgical History:  Procedure Laterality Date  . ANKLE FRACTURE SURGERY Left   . BREAST CYST ASPIRATION Right 90s  . COLONOSCOPY WITH PROPOFOL N/A 04/12/2016   Procedure: COLONOSCOPY WITH PROPOFOL;  Surgeon: Manya Silvas, MD;  Location: [facility] ENDOSCOPY;  Service: Endoscopy;  Laterality: N/A;  . FRACTURE SURGERY     ANKLE  . HAMMER TOE SURGERY Right 02/08/2018   Procedure: HAMMER TOE CORRECTION-5TH TOE, Excision soft tissue mass dorsal lateral right fifth toe and exostectomy dorsal lateral DIPJ fifth toe;  Surgeon: Samara Deist, DPM;  Location: [facility];  Service: Podiatry;  Laterality: Right;  IVA LOCAL  . moses surgery  09/2015  . SINUS SURGERY WITH INSTATRAK  10/2017   DEVIATED SEPTUM  . 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 Hypertension Mother   . Colon cancer Mother   . Lung cancer Father   .  Brain cancer Father   . Hyperlipidemia Brother   . Breast cancer Neg Hx    Social History   Socioeconomic History  . Marital status: Married    Spouse name: Not on file  . Number of children: Not on file  . Years of education: Not on file  . Highest education level: Master's degree (e.g., MA, MS, MEng, MEd, MSW, MBA)  Occupational History  . Occupation: retired  Tobacco Use  . Smoking status: Former Smoker    Packs/day: 1.00    Years: 4.00    Pack years: 4.00    Types: Cigarettes    Quit date: 1970    Years since quitting: 51.4  . Smokeless tobacco: Never Used  Substance and Sexual Activity  . Alcohol use: Yes    Alcohol/week: 10.0 standard drinks     Types: 10 Glasses of wine per week  . Drug use: No  . Sexual activity: Yes  Other Topics Concern  . Not on file  Social History Narrative  . Not on file   Social Determinants of Health   Financial Resource Strain: Low Risk   . Difficulty of Paying Living Expenses: Not hard at all  Food Insecurity: No Food Insecurity  . Worried About Charity fundraiser in the Last Year: Never true  . Ran Out of Food in the Last Year: Never true  Transportation Needs: No Transportation Needs  . Lack of Transportation (Medical): No  . Lack of Transportation (Non-Medical): No  Physical Activity: Insufficiently Active  . Days of Exercise per Week: 3 days  . Minutes of Exercise per Session: 30 min  Stress:   . Feeling of Stress :   Social Connections: Somewhat Isolated  . Frequency of Communication with Friends and Family: More than three times a week  . Frequency of Social Gatherings with Friends and Family: More than three times a week  . Attends Religious Services: Never  . Active Member of Clubs or Organizations: No  . Attends Archivist Meetings: Never  . Marital Status: Married    Outpatient Encounter Medications as of 05/12/2020  Medication Sig  . ALPRAZolam (XANAX) 0.25 MG tablet Take 1 tablet (0.25 mg total) by mouth at bedtime as needed for anxiety.  . ibandronate (BONIVA) 150 MG tablet Take one tab. every 30 days,Take in the am with a 8oz glass of water,an empty stomach,nothing by mouth for 30 min, dont lie down for 30 min  . levothyroxine (SYNTHROID) 75 MCG tablet Take 1 tablet (75 mcg total) by mouth daily before breakfast.  . lovastatin (MEVACOR) 20 MG tablet lovastatin 20 mg tablet  . metoprolol succinate (TOPROL-XL) 50 MG 24 hr tablet Take 1/2 tablet (25 mg) daily with or immediately following a meal.  . scopolamine (TRANSDERM-SCOP, 1.5 MG,) 1 MG/3DAYS Place 1 patch (1.5 mg total) onto the skin every 3 (three) days.  Marland Kitchen triamcinolone cream (KENALOG) 0.1 % Apply 1  application topically 2 (two) times daily. AAA bid x 2 weeks, then QHS x 2 weeks, then M-W-F QHS x 2 weeks, then twice weekly  . [DISCONTINUED] fluocinonide (LIDEX) 0.05 % external solution    No facility-administered encounter medications on file as of 05/12/2020.    Activities of Daily Living In your present state of health, do you have any difficulty performing the following activities: 05/12/2020  Hearing? Y  Comment bilateral hearing aids  Vision? N  Comment reading glasses, thrumond eye  Difficulty concentrating or making decisions? N  Walking or climbing  stairs? N  Dressing or bathing? N  Doing errands, shopping? N  Preparing Food and eating ? N  Using the Toilet? N  In the past six months, have you accidently leaked urine? N  Do you have problems with loss of bowel control? N  Managing your Medications? N  Managing your Finances? N  Housekeeping or managing your Housekeeping? N  Some recent data might be hidden    Patient Care Team: Volney American, PA-C as PCP - General (Family Medicine) Beverly Gust, MD (Unknown Physician Specialty) Oneta Rack, MD (Dermatology) Vladimir Crofts, MD (Neurology) Will Bonnet, MD as Attending Physician (Obstetrics and Gynecology) Efrain Sella, MD as Consulting Physician (Gastroenterology) Odette Fraction (Optometry) Corey Skains, MD as Consulting Physician (Cardiology) Lollie Sails, MD (Inactive) as Consulting Physician (Gastroenterology) Billey Co, MD as Consulting Physician (Urology) Earnestine Leys, MD (Orthopedic Surgery)    Assessment:   This is a routine wellness examination for [facility].  Exercise Activities and Dietary recommendations Current Exercise Habits: Home exercise routine, Type of exercise: walking, Time (Minutes): 30, Frequency (Times/Week): 3, Weekly Exercise (Minutes/Week): 90, Intensity: Mild, Exercise limited by: orthopedic condition(s)  Goals Addressed   None      Fall Risk: Fall Risk  05/12/2020 05/10/2019 05/03/2019 05/01/2018 04/15/2017  Falls in the past year? 0 0 0 No No  Number falls in past yr: 0 - 0 - -  Injury with Fall? 0 - 0 - -    FALL RISK PREVENTION PERTAINING TO THE HOME:  Any stairs in or around the home? Yes  If so, are there any without handrails? No   Home free of loose throw rugs in walkways, pet beds, electrical cords, etc? Yes  Adequate lighting in your home to reduce risk of falls? Yes   ASSISTIVE DEVICES UTILIZED TO PREVENT FALLS:  Life alert? No  Use of a cane, walker or w/c? No  Grab bars in the bathroom? No  Shower chair or bench in shower? No  Elevated toilet seat or a handicapped toilet? No   DME ORDERS:  DME order needed?  No   TIMED UP AND GO:  Was the test performed? Yes .  Length of time to ambulate 10 feet: 8 sec.   GAIT:  Appearance of gait: Gait steady and fast without the use of an assistive device.  Education: Fall risk prevention has been discussed.  Intervention(s) required? No   DME/home health order needed?  No    Depression Screen PHQ 2/9 Scores 05/12/2020 05/10/2019 05/03/2019 05/01/2018  PHQ - 2 Score 0 0 0 0  PHQ- 9 Score - - 0 -     Cognitive Function     6CIT Screen 05/12/2020 05/10/2019 05/01/2018  What Year? 0 points 0 points 0 points  What month? 0 points 0 points 0 points  What time? 0 points 0 points 0 points  Count back from 20 0 points 0 points 0 points  Months in reverse 0 points 0 points 0 points  Repeat phrase 0 points 0 points 0 points  Total Score 0 0 0    Immunization History  Administered Date(s) Administered  . Influenza, High Dose Seasonal PF 09/07/2018, 08/17/2019  . Influenza-Unspecified 10/20/2015, 11/03/2017  . Moderna SARS-COVID-2 Vaccination 01/10/2020, 02/06/2020  . Pneumococcal Conjugate-13 09/16/2015  . Pneumococcal Polysaccharide-23 07/03/2013  . Td 01/31/2007  . Tdap 05/01/2018  . Zoster 02/18/2009    Qualifies for Shingles Vaccine? Yes   Zostavax completed 2010. Due for  Shingrix. Education has been provided regarding the importance of this vaccine. Pt has been advised to call insurance company to determine out of pocket expense. Advised may also receive vaccine at local pharmacy or Health Dept. Verbalized acceptance and understanding.  Tdap: up to date   Flu Vaccine: up to date   Pneumococcal Vaccine: completed series   Covid-19 Vaccine: Completed vaccines  CScreening Tests Health Maintenance  Topic Date Due  . INFLUENZA VACCINE  07/20/2020  . COLONOSCOPY  04/12/2021  . TETANUS/TDAP  05/01/2028  . DEXA SCAN  Completed  . COVID-19 Vaccine  Completed  . Hepatitis C Screening  Completed  . PNA vac Low Risk Adult  Completed    Cancer Screenings:  Colorectal Screening: Completed 11/23/2019. Repeat every 5 years  Mammogram: ordered    Bone Density: Completed 01/23/2020.   Lung Cancer Screening: (Low Dose CT Chest recommended if Age 75-80 years, 30 pack-year currently smoking OR have quit w/in 15years.) does not qualify.     Additional Screening:  Hepatitis C Screening: does qualify; Completed 2016  Vision Screening: Recommended annual ophthalmology exams for early detection of glaucoma and other disorders of the eye. Is the patient up to date with their annual eye exam?  Yes  Who is the provider or what is the name of the office in which the pt attends annual eye exams? Dr.Thurmond     Dental Screening: Recommended annual dental exams for proper oral hygiene  Community Resource Referral:  CRR required this visit?  No       Plan:  I have personally reviewed and addressed the Medicare Annual Wellness questionnaire and have noted the following in the patient's chart:  A. Medical and social history B. Use of alcohol, tobacco or illicit drugs  C. Current medications and supplements D. Functional ability and status E.  Nutritional status F.  Physical activity G. Advance directives H. List of other  physicians I.  Hospitalizations, surgeries, and ER visits in previous 12 months J.  [place] such as hearing and vision if needed, cognitive and depression L. Referrals and appointments   In addition, I have reviewed and discussed with patient certain preventive protocols, quality metrics, and best practice recommendations. A written personalized care plan for preventive services as well as general preventive health recommendations were provided to patient.  Signed,    Bevelyn Ngo, LPN  [phone] Nurse Health Advisor   Nurse Notes: none   NEEDS REFILL ON Duanne Moron, provider informed. cpe today.

## 2020-05-12 NOTE — Progress Notes (Signed)
BP 123/78   Pulse 64   Temp 97.6 F (36.4 C) (Temporal)   Ht 5\' 6"  (1.676 m)   Wt 152 lb 9.6 oz (69.2 kg)   LMP  (LMP Unknown)   BMI 24.63 kg/m    Subjective:    Patient ID: Savannah [name], female    DOB: 1946-06-11, 74 y.o.   MRN: [id number]  HPI: Savannah [name] is a 74 y.o. female presenting on 05/12/2020 for comprehensive medical examination. Current medical complaints include:see below  HTN - Home BPs 120-130/80s when checked. Taking medication faithfully without side effects. Denies CP, SOB, HAs, dizziness.   HLD - on lovastatin, well tolerated. Denies claudication, myalgias. Eating healthy diet and staying active.   Osteoporisis - on boniva and calcium, vit D. Weight bearing exercise regularly. No recent fractures.   Hypothyroid - Tolerating synthroid well, no new concerns  GERD - Stable on prn PPIs and diet management  Anxiety - gets a script for xanax typically once a year for airplanes or the occasional panic attack/nervousness. Rarely uses all the tablets prior to expiration.   She currently lives with: Menopausal Symptoms: no  Depression Screen done today and results listed below:  Depression screen [facility] 2/9 05/12/2020 05/10/2019 05/03/2019 05/01/2018 04/15/2017  Decreased Interest 0 0 0 0 0  Down, Depressed, Hopeless 0 0 0 0 0  PHQ - 2 Score 0 0 0 0 0  Altered sleeping - - 0 - 0  Tired, decreased energy - - 0 - 0  Change in appetite - - 0 - 0  Feeling bad or failure about yourself  - - 0 - 0  Trouble concentrating - - 0 - 0  Moving slowly or fidgety/restless - - 0 - 0  Suicidal thoughts - - 0 - 0  PHQ-9 Score - - 0 - 0    The patient does not have a history of falls. I did complete a risk assessment for falls. A plan of care for falls was documented.   Past Medical History:  Past Medical History:  Diagnosis Date  . Anxiety   . Cancer (HCC)    skin- squamous  . HOH (hard of hearing)    WEARS AIDS  . Hyperlipidemia   . Hypertension    CONTROLLED ON MEDS  .  Hypothyroidism   . IBS (irritable bowel syndrome)   . MVP (mitral valve prolapse)   . OA (osteoarthritis) of neck    JOINTS  . Osteoporosis   . Thyroid disease     Surgical History:  Past Surgical History:  Procedure Laterality Date  . ANKLE FRACTURE SURGERY Left   . BREAST CYST ASPIRATION Right 90s  . COLONOSCOPY WITH PROPOFOL N/A 04/12/2016   Procedure: COLONOSCOPY WITH PROPOFOL;  Surgeon: Manya Silvas, MD;  Location: [facility] ENDOSCOPY;  Service: Endoscopy;  Laterality: N/A;  . FRACTURE SURGERY     ANKLE  . HAMMER TOE SURGERY Right 02/08/2018   Procedure: HAMMER TOE CORRECTION-5TH TOE, Excision soft tissue mass dorsal lateral right fifth toe and exostectomy dorsal lateral DIPJ fifth toe;  Surgeon: Samara Deist, DPM;  Location: [facility];  Service: Podiatry;  Laterality: Right;  IVA LOCAL  . moses surgery  09/2015  . SINUS SURGERY WITH INSTATRAK  10/2017   DEVIATED SEPTUM  . 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 ibandronate (BONIVA) 150 MG tablet Take one tab.  every 30 days,Take in the am with a 8oz glass of water,an empty stomach,nothing by mouth for 30 min, dont lie down for 30 min  . scopolamine (TRANSDERM-SCOP, 1.5 MG,) 1 MG/3DAYS Place 1 patch (1.5 mg total) onto the skin every 3 (three) days.  Marland Kitchen triamcinolone cream (KENALOG) 0.1 % Apply 1 application topically 2 (two) times daily. AAA bid x 2 weeks, then QHS x 2 weeks, then M-W-F QHS x 2 weeks, then twice weekly   No current facility-administered medications on file prior to visit.    Allergies:  Allergies  Allergen Reactions  . Amoxil [Amoxicillin] Itching  . Penicillins Other (See Comments)    "passed out"    Social History:  Social History   Socioeconomic History  . Marital status: Married    Spouse name: Not on file  . Number of children: Not on file  . Years of education: Not on file  .  Highest education level: Master's degree (e.g., MA, MS, MEng, MEd, MSW, MBA)  Occupational History  . Occupation: retired  Tobacco Use  . Smoking status: Former Smoker    Packs/day: 1.00    Years: 4.00    Pack years: 4.00    Types: Cigarettes    Quit date: 1970    Years since quitting: 51.4  . Smokeless tobacco: Never Used  Substance and Sexual Activity  . Alcohol use: Yes    Alcohol/week: 10.0 standard drinks    Types: 10 Glasses of wine per week  . Drug use: No  . Sexual activity: Yes  Other Topics Concern  . Not on file  Social History Narrative  . Not on file   Social Determinants of Health   Financial Resource Strain: Low Risk   . Difficulty of Paying Living Expenses: Not hard at all  Food Insecurity: No Food Insecurity  . Worried About Charity fundraiser in the Last Year: Never true  . Ran Out of Food in the Last Year: Never true  Transportation Needs: No Transportation Needs  . Lack of Transportation (Medical): No  . Lack of Transportation (Non-Medical): No  Physical Activity: Insufficiently Active  . Days of Exercise per Week: 3 days  . Minutes of Exercise per Session: 30 min  Stress:   . Feeling of Stress :   Social Connections: Somewhat Isolated  . Frequency of Communication with Friends and Family: More than three times a week  . Frequency of Social Gatherings with Friends and Family: More than three times a week  . Attends Religious Services: Never  . Active Member of Clubs or Organizations: No  . Attends Archivist Meetings: Never  . Marital Status: Married  Human resources officer Violence:   . Fear of Current or Ex-Partner:   . Emotionally Abused:   Marland Kitchen Physically Abused:   . Sexually Abused:    Social History   Tobacco Use  Smoking Status Former Smoker  . Packs/day: 1.00  . Years: 4.00  . Pack years: 4.00  . Types: Cigarettes  . Quit date: 79  . Years since quitting: 51.4  Smokeless Tobacco Never Used   Social History   Substance  and Sexual Activity  Alcohol Use Yes  . Alcohol/week: 10.0 standard drinks  . Types: 10 Glasses of wine per week    Family History:  Family History  Problem Relation Age of Onset  . Hypertension Mother   . Colon cancer Mother   . Lung cancer Father   . Brain cancer Father   .  Hyperlipidemia Brother   . Breast cancer Neg Hx     Past medical history, surgical history, medications, allergies, family history and social history reviewed with patient today and changes made to appropriate areas of the chart.   Review of Systems - General ROS: negative Psychological ROS: negative Ophthalmic ROS: negative ENT ROS: negative Allergy and Immunology ROS: negative Hematological and Lymphatic ROS: negative Endocrine ROS: negative Breast ROS: negative for breast lumps Respiratory ROS: no cough, shortness of breath, or wheezing Cardiovascular ROS: no chest pain or dyspnea on exertion Gastrointestinal ROS: no abdominal pain, change in bowel habits, or black or bloody stools Genito-Urinary ROS: no dysuria, trouble voiding, or hematuria Musculoskeletal ROS: negative Neurological ROS: no TIA or stroke symptoms Dermatological ROS: negative All other ROS negative except what is listed above and in the HPI.      Objective:    BP 123/78   Pulse 64   Temp 97.6 F (36.4 C) (Temporal)   Ht 5\' 6"  (1.676 m)   Wt 152 lb 9.6 oz (69.2 kg)   LMP  (LMP Unknown)   BMI 24.63 kg/m   Wt Readings from Last 3 Encounters:  05/12/20 152 lb 9.6 oz (69.2 kg)  05/12/20 152 lb 9.6 oz (69.2 kg)  04/23/20 152 lb (68.9 kg)    Physical Exam Vitals and nursing note reviewed. Exam conducted with a chaperone present.  Constitutional:      General: She is not in acute distress.    Appearance: She is well-developed.  HENT:     Head: Atraumatic.     Right Ear: External ear normal.     Left Ear: External ear normal.     Nose: Nose normal.     Mouth/Throat:     Pharynx: No oropharyngeal exudate.  Eyes:      General: No scleral icterus.    Conjunctiva/sclera: Conjunctivae normal.     Pupils: Pupils are equal, round, and reactive to light.  Neck:     Thyroid: No thyromegaly.  Cardiovascular:     Rate and Rhythm: Normal rate and regular rhythm.     Heart sounds: Normal heart sounds.  Pulmonary:     Effort: Pulmonary effort is normal. No respiratory distress.     Breath sounds: Normal breath sounds.  Chest:     Breasts:        Right: No mass, skin change or tenderness.        Left: No mass, skin change or tenderness.  Abdominal:     General: Bowel sounds are normal.     Palpations: Abdomen is soft. There is no mass.     Tenderness: There is no abdominal tenderness.  Genitourinary:    Comments: GU exam deferred to GYN Musculoskeletal:        General: No tenderness. Normal range of motion.     Cervical back: Normal range of motion and neck supple.  Lymphadenopathy:     Cervical: No cervical adenopathy.     Upper Body:     Right upper body: No axillary adenopathy.     Left upper body: No axillary adenopathy.  Skin:    General: Skin is warm and dry.     Findings: No rash.  Neurological:     Mental Status: She is alert and oriented to person, place, and time.     Cranial Nerves: No cranial nerve deficit.  Psychiatric:        Behavior: Behavior normal.     Results for orders placed or performed  in visit on 05/12/20  Microscopic Examination   URINE  Result Value Ref Range   WBC, UA None seen 0 - 5 /hpf   RBC 0-2 0 - 2 /hpf   Epithelial Cells (non renal) 0-10 0 - 10 /hpf   Bacteria, UA None seen None seen/Few  CBC with Differential/Platelet  Result Value Ref Range   WBC 6.5 3.4 - 10.8 x10E3/uL   RBC 4.72 3.77 - 5.28 x10E6/uL   Hemoglobin 15.1 11.1 - 15.9 g/dL   Hematocrit 46.3 34.0 - 46.6 %   MCV 98 (H) 79 - 97 fL   MCH 32.0 26.6 - 33.0 pg   MCHC 32.6 31.5 - 35.7 g/dL   RDW 11.9 11.7 - 15.4 %   Platelets 201 150 - 450 x10E3/uL   Neutrophils 68 Not Estab. %   Lymphs 20 Not  Estab. %   Monocytes 8 Not Estab. %   Eos 2 Not Estab. %   Basos 1 Not Estab. %   Neutrophils Absolute 4.5 1.4 - 7.0 x10E3/uL   Lymphocytes Absolute 1.3 0.7 - 3.1 x10E3/uL   Monocytes Absolute 0.5 0.1 - 0.9 x10E3/uL   EOS (ABSOLUTE) 0.1 0.0 - 0.4 x10E3/uL   Basophils Absolute 0.0 0.0 - 0.2 x10E3/uL   Immature Granulocytes 1 Not Estab. %   Immature Grans (Abs) 0.0 0.0 - 0.1 x10E3/uL  Comprehensive metabolic panel  Result Value Ref Range   Glucose 82 65 - 99 mg/dL   BUN 11 8 - 27 mg/dL   Creatinine, Ser 0.76 0.57 - 1.00 mg/dL   GFR calc non Af Amer 77 >59 mL/min/1.73   GFR calc Af Amer 89 >59 mL/min/1.73   BUN/Creatinine Ratio 14 12 - 28   Sodium 141 134 - 144 mmol/L   Potassium 4.1 3.5 - 5.2 mmol/L   Chloride 102 96 - 106 mmol/L   CO2 25 20 - 29 mmol/L   Calcium 9.1 8.7 - 10.3 mg/dL   Total Protein 6.7 6.0 - 8.5 g/dL   Albumin 4.6 3.7 - 4.7 g/dL   Globulin, Total 2.1 1.5 - 4.5 g/dL   Albumin/Globulin Ratio 2.2 1.2 - 2.2   Bilirubin Total 0.4 0.0 - 1.2 mg/dL   Alkaline Phosphatase 61 48 - 121 IU/L   AST 17 0 - 40 IU/L   ALT 11 0 - 32 IU/L  Lipid Panel w/o Chol/HDL Ratio  Result Value Ref Range   Cholesterol, Total 224 (H) 100 - 199 mg/dL   Triglycerides 143 0 - 149 mg/dL   HDL 78 >39 mg/dL   VLDL Cholesterol Cal 25 5 - 40 mg/dL   LDL Chol Calc (NIH) 121 (H) 0 - 99 mg/dL  TSH  Result Value Ref Range   TSH 1.650 0.450 - 4.500 uIU/mL  UA/M w/rflx Culture, Routine   Specimen: Urine   URINE  Result Value Ref Range   Specific Gravity, UA 1.015 1.005 - 1.030   pH, UA 5.5 5.0 - 7.5   Color, UA Yellow Yellow   Appearance Ur Clear Clear   Leukocytes,UA Negative Negative   Protein,UA Negative Negative/Trace   Glucose, UA Negative Negative   Ketones, UA Negative Negative   RBC, UA Trace (A) Negative   Bilirubin, UA Negative Negative   Urobilinogen, Ur 0.2 0.2 - 1.0 mg/dL   Nitrite, UA Negative Negative   Microscopic Examination See below:       Assessment & Plan:    Problem List Items Addressed This Visit      Cardiovascular  and Mediastinum   Essential hypertension - Primary    BPs stable and WNL, continue current regimen      Relevant Medications   metoprolol succinate (TOPROL-XL) 50 MG 24 hr tablet   lovastatin (MEVACOR) 20 MG tablet   Other Relevant Orders   CBC with Differential/Platelet (Completed)   Comprehensive metabolic panel (Completed)   UA/M w/rflx Culture, Routine (Completed)   Microscopic Examination (Completed)     Digestive   GERD (gastroesophageal reflux disease)    Stable on PRN PPI, continue current regimen        Endocrine   Hypothyroidism    Recheck TSH, adjust as needed. Continue current regimen      Relevant Medications   metoprolol succinate (TOPROL-XL) 50 MG 24 hr tablet   levothyroxine (SYNTHROID) 75 MCG tablet   Other Relevant Orders   TSH (Completed)     Musculoskeletal and Integument   Osteoporosis    Stable, UTD on DXA and no recent fx's. Continue current regimen and good lifestyle habits, OTC vitamins        Other   Hyperlipidemia    Recheck lipids, adjust as needed. Continue good lifestyle habits      Relevant Medications   metoprolol succinate (TOPROL-XL) 50 MG 24 hr tablet   lovastatin (MEVACOR) 20 MG tablet   Other Relevant Orders   Lipid Panel w/o Chol/HDL Ratio (Completed)   Chronic anxiety    Stable on rare prn use of xanax. Precautions reviewed      Relevant Medications   ALPRAZolam (XANAX) 0.25 MG tablet    Other Visit Diagnoses    Annual physical exam           Follow up plan: Return in about 6 months (around 11/12/2020) for 6 month f/u.   LABORATORY TESTING:  - Pap smear: not applicable  IMMUNIZATIONS:   - Tdap: Tetanus vaccination status reviewed: last tetanus booster within 10 years. - Influenza: Up to date - Pneumovax: Up to date - Prevnar: Up to date - HPV: Not applicable - Zostavax vaccine: Up to date  SCREENING: -Mammogram: Up to date  - Colonoscopy:  Up to date  - Bone Density: Up to date   PATIENT COUNSELING:   Advised to take 1 mg of folate supplement per day if capable of pregnancy.   Sexuality: Discussed sexually transmitted diseases, partner selection, use of condoms, avoidance of unintended pregnancy  and contraceptive alternatives.   Advised to avoid cigarette smoking.  I discussed with the patient that most people either abstain from alcohol or drink within safe limits (<=14/week and <=4 drinks/occasion for males, <=7/weeks and <= 3 drinks/occasion for females) and that the risk for alcohol disorders and other health effects rises proportionally with the number of drinks per week and how often a drinker exceeds daily limits.  Discussed cessation/primary prevention of drug use and availability of treatment for abuse.   Diet: Encouraged to adjust caloric intake to maintain  or achieve ideal body weight, to reduce intake of dietary saturated fat and total fat, to limit sodium intake by avoiding high sodium foods and not adding table salt, and to maintain adequate dietary potassium and calcium preferably from fresh fruits, vegetables, and low-fat dairy products.    stressed the importance of regular exercise  Injury prevention: Discussed safety belts, safety helmets, smoke detector, smoking near bedding or upholstery.   Dental health: Discussed importance of regular tooth brushing, flossing, and dental visits.    NEXT PREVENTATIVE PHYSICAL DUE IN 1 YEAR. Return in  about 6 months (around 11/12/2020) for 6 month f/u.

## 2020-05-12 NOTE — Patient Instructions (Signed)
Savannah [name] , Thank you for taking time to come for your Medicare Wellness Visit. I appreciate your ongoing commitment to your health goals. Please review the following plan we discussed and let me know if I can assist you in the future.   Screening recommendations/referrals: Colonoscopy: no longer required  Mammogram: no longer required  Bone Density: completed 01/2020 Recommended yearly ophthalmology/optometry visit for glaucoma screening and checkup Recommended yearly dental visit for hygiene and checkup  Vaccinations: Influenza vaccine: due 08/2020 Pneumococcal vaccine: completed series  Tdap vaccine: completed 2019 Shingles vaccine: shingrix eligible. Check with your insurance for coverage    Covid-19: completed   Advanced directives: Please bring a copy of your health care power of attorney and living will to the office at your convenience.  Conditions/risks identified: none   Next appointment: Follow up in one year for your annual wellness visit.    Preventive Care 32 Years and Older, Female Preventive care refers to lifestyle choices and visits with your health care provider that can promote health and wellness. What does preventive care include?  A yearly physical exam. This is also called an annual well check.  Dental exams once or twice a year.  Routine eye exams. Ask your health care provider how often you should have your eyes checked.  Personal lifestyle choices, including:  Daily care of your teeth and gums.  Regular physical activity.  Eating a healthy diet.  Avoiding tobacco and drug use.  Limiting alcohol use.  Practicing safe sex.  Taking low-dose aspirin every day.  Taking vitamin and mineral supplements as recommended by your health care provider. What happens during an annual well check? The services and screenings done by your health care provider during your annual well check will depend on your age, overall health, lifestyle risk factors, and  family history of disease. Counseling  Your health care provider may ask you questions about your:  Alcohol use.  Tobacco use.  Drug use.  Emotional well-being.  Home and relationship well-being.  Sexual activity.  Eating habits.  History of falls.  Memory and ability to understand (cognition).  Work and work Statistician.  Reproductive health. Screening  You may have the following tests or measurements:  Height, weight, and BMI.  Blood pressure.  Lipid and cholesterol levels. These may be checked every 5 years, or more frequently if you are over 75 years old.  Skin check.  Lung cancer screening. You may have this screening every year starting at age 71 if you have a 30-pack-year history of smoking and currently smoke or have quit within the past 15 years.  Fecal occult blood test (FOBT) of the stool. You may have this test every year starting at age 41.  Flexible sigmoidoscopy or colonoscopy. You may have a sigmoidoscopy every 5 years or a colonoscopy every 10 years starting at age 41.  Hepatitis C blood test.  Hepatitis B blood test.  Sexually transmitted disease (STD) testing.  Diabetes screening. This is done by checking your blood sugar (glucose) after you have not eaten for a while (fasting). You may have this done every 1-3 years.  Bone density scan. This is done to screen for osteoporosis. You may have this done starting at age 64.  Mammogram. This may be done every 1-2 years. Talk to your health care provider about how often you should have regular mammograms. Talk with your health care provider about your test results, treatment options, and if necessary, the need for more tests. Vaccines  Your  health care provider may recommend certain vaccines, such as:  Influenza vaccine. This is recommended every year.  Tetanus, diphtheria, and acellular pertussis (Tdap, Td) vaccine. You may need a Td booster every 10 years.  Zoster vaccine. You may need this  after age 26.  Pneumococcal 13-valent conjugate (PCV13) vaccine. One dose is recommended after age 19.  Pneumococcal polysaccharide (PPSV23) vaccine. One dose is recommended after age 66. Talk to your health care provider about which screenings and vaccines you need and how often you need them. This information is not intended to replace advice given to you by your health care provider. Make sure you discuss any questions you have with your health care provider. Document Released: 01/02/2016 Document Revised: 08/25/2016 Document Reviewed: 10/07/2015 Elsevier Interactive Patient Education  2017 [place] Prevention in the Home Falls can cause injuries. They can happen to people of all ages. There are many things you can do to make your home safe and to help prevent falls. What can I do on the outside of my home?  Regularly fix the edges of walkways and driveways and fix any cracks.  Remove anything that might make you trip as you walk through a door, such as a raised step or threshold.  Trim any bushes or trees on the path to your home.  Use bright outdoor lighting.  Clear any walking paths of anything that might make someone trip, such as rocks or tools.  Regularly check to see if handrails are loose or broken. Make sure that both sides of any steps have handrails.  Any raised decks and porches should have guardrails on the edges.  Have any leaves, snow, or ice cleared regularly.  Use sand or salt on walking paths during winter.  Clean up any spills in your garage right away. This includes oil or grease spills. What can I do in the bathroom?  Use night lights.  Install grab bars by the toilet and in the tub and shower. Do not use towel bars as grab bars.  Use non-skid mats or decals in the tub or shower.  If you need to sit down in the shower, use a plastic, non-slip stool.  Keep the floor dry. Clean up any water that spills on the floor as soon as it  happens.  Remove soap buildup in the tub or shower regularly.  Attach bath mats securely with double-sided non-slip rug tape.  Do not have throw rugs and other things on the floor that can make you trip. What can I do in the bedroom?  Use night lights.  Make sure that you have a light by your bed that is easy to reach.  Do not use any sheets or blankets that are too big for your bed. They should not hang down onto the floor.  Have a firm chair that has side arms. You can use this for support while you get dressed.  Do not have throw rugs and other things on the floor that can make you trip. What can I do in the kitchen?  Clean up any spills right away.  Avoid walking on wet floors.  Keep items that you use a lot in easy-to-reach places.  If you need to reach something above you, use a strong step stool that has a grab bar.  Keep electrical cords out of the way.  Do not use floor polish or wax that makes floors slippery. If you must use wax, use non-skid floor wax.  Do not  have throw rugs and other things on the floor that can make you trip. What can I do with my stairs?  Do not leave any items on the stairs.  Make sure that there are handrails on both sides of the stairs and use them. Fix handrails that are broken or loose. Make sure that handrails are as long as the stairways.  Check any carpeting to make sure that it is firmly attached to the stairs. Fix any carpet that is loose or worn.  Avoid having throw rugs at the top or bottom of the stairs. If you do have throw rugs, attach them to the floor with carpet tape.  Make sure that you have a light switch at the top of the stairs and the bottom of the stairs. If you do not have them, ask someone to add them for you. What else can I do to help prevent falls?  Wear shoes that:  Do not have high heels.  Have rubber bottoms.  Are comfortable and fit you well.  Are closed at the toe. Do not wear sandals.  If you  use a stepladder:  Make sure that it is fully opened. Do not climb a closed stepladder.  Make sure that both sides of the stepladder are locked into place.  Ask someone to hold it for you, if possible.  Clearly mark and make sure that you can see:  Any grab bars or handrails.  First and last steps.  Where the edge of each step is.  Use tools that help you move around (mobility aids) if they are needed. These include:  Canes.  Walkers.  Scooters.  Crutches.  Turn on the lights when you go into a dark area. Replace any light bulbs as soon as they burn out.  Set up your furniture so you have a clear path. Avoid moving your furniture around.  If any of your floors are uneven, fix them.  If there are any pets around you, be aware of where they are.  Review your medicines with your doctor. Some medicines can make you feel dizzy. This can increase your chance of falling. Ask your doctor what other things that you can do to help prevent falls. This information is not intended to replace advice given to you by your health care provider. Make sure you discuss any questions you have with your health care provider. Document Released: 10/02/2009 Document Revised: 05/13/2016 Document Reviewed: 01/10/2015 Elsevier Interactive Patient Education  2017 [employer].

## 2020-05-13 LAB — CBC WITH DIFFERENTIAL/PLATELET
Basophils Absolute: 0 10*3/uL (ref 0.0–0.2)
Basos: 1 %
EOS (ABSOLUTE): 0.1 10*3/uL (ref 0.0–0.4)
Eos: 2 %
Hematocrit: 46.3 % (ref 34.0–46.6)
Hemoglobin: 15.1 g/dL (ref 11.1–15.9)
Immature Grans (Abs): 0 10*3/uL (ref 0.0–0.1)
Immature Granulocytes: 1 %
Lymphocytes Absolute: 1.3 10*3/uL (ref 0.7–3.1)
Lymphs: 20 %
MCH: 32 pg (ref 26.6–33.0)
MCHC: 32.6 g/dL (ref 31.5–35.7)
MCV: 98 fL — ABNORMAL HIGH (ref 79–97)
Monocytes Absolute: 0.5 10*3/uL (ref 0.1–0.9)
Monocytes: 8 %
Neutrophils Absolute: 4.5 10*3/uL (ref 1.4–7.0)
Neutrophils: 68 %
Platelets: 201 10*3/uL (ref 150–450)
RBC: 4.72 x10E6/uL (ref 3.77–5.28)
RDW: 11.9 % (ref 11.7–15.4)
WBC: 6.5 10*3/uL (ref 3.4–10.8)

## 2020-05-13 LAB — COMPREHENSIVE METABOLIC PANEL
ALT: 11 IU/L (ref 0–32)
AST: 17 IU/L (ref 0–40)
Albumin/Globulin Ratio: 2.2 (ref 1.2–2.2)
Albumin: 4.6 g/dL (ref 3.7–4.7)
Alkaline Phosphatase: 61 IU/L (ref 48–121)
BUN/Creatinine Ratio: 14 (ref 12–28)
BUN: 11 mg/dL (ref 8–27)
Bilirubin Total: 0.4 mg/dL (ref 0.0–1.2)
CO2: 25 mmol/L (ref 20–29)
Calcium: 9.1 mg/dL (ref 8.7–10.3)
Chloride: 102 mmol/L (ref 96–106)
Creatinine, Ser: 0.76 mg/dL (ref 0.57–1.00)
GFR calc Af Amer: 89 mL/min/{1.73_m2} (ref >59)
GFR calc non Af Amer: 77 mL/min/{1.73_m2} (ref >59)
Globulin, Total: 2.1 g/dL (ref 1.5–4.5)
Glucose: 82 mg/dL (ref 65–99)
Potassium: 4.1 mmol/L (ref 3.5–5.2)
Sodium: 141 mmol/L (ref 134–144)
Total Protein: 6.7 g/dL (ref 6.0–8.5)

## 2020-05-13 LAB — LIPID PANEL W/O CHOL/HDL RATIO
Cholesterol, Total: 224 mg/dL — ABNORMAL HIGH (ref 100–199)
HDL: 78 mg/dL (ref >39)
LDL Chol Calc (NIH): 121 mg/dL — ABNORMAL HIGH (ref 0–99)
Triglycerides: 143 mg/dL (ref 0–149)
VLDL Cholesterol Cal: 25 mg/dL (ref 5–40)

## 2020-05-13 LAB — TSH: TSH: 1.65 u[IU]/mL (ref 0.450–4.500)

## 2020-05-19 NOTE — Assessment & Plan Note (Signed)
Stable, UTD on DXA and no recent fx's. Continue current regimen and good lifestyle habits, OTC vitamins

## 2020-05-19 NOTE — Assessment & Plan Note (Signed)
BPs stable and WNL, continue current regimen 

## 2020-05-19 NOTE — Assessment & Plan Note (Signed)
Recheck TSH, adjust as needed. Continue current regimen 

## 2020-05-19 NOTE — Assessment & Plan Note (Signed)
Stable on PRN PPI, continue current regimen

## 2020-05-19 NOTE — Assessment & Plan Note (Signed)
Recheck lipids, adjust as needed. Continue good lifestyle habits

## 2020-05-19 NOTE — Assessment & Plan Note (Signed)
Stable on rare prn use of xanax. Precautions reviewed

## 2020-06-12 IMAGING — CR DG CHEST 2V
1 series · 2 of 2 positions shown · non-contrast
Comparison: Rib radiographs, [DATE].  Chest CT, 10/08/2019.

CLINICAL DATA: Pt states pain in right lower thoracic area and
radiates down to right hip since [REDACTED] , no injury. Pt had
a MRI on [REDACTED] that showed bilateral pleural effusions. History of
HTN, former smoker. shielded

EXAM:
CHEST - 2 VIEW

[Series 1: dg chest 2 view · 0.14mm/px · 2 of 2 slices shown]
[im 1/2]
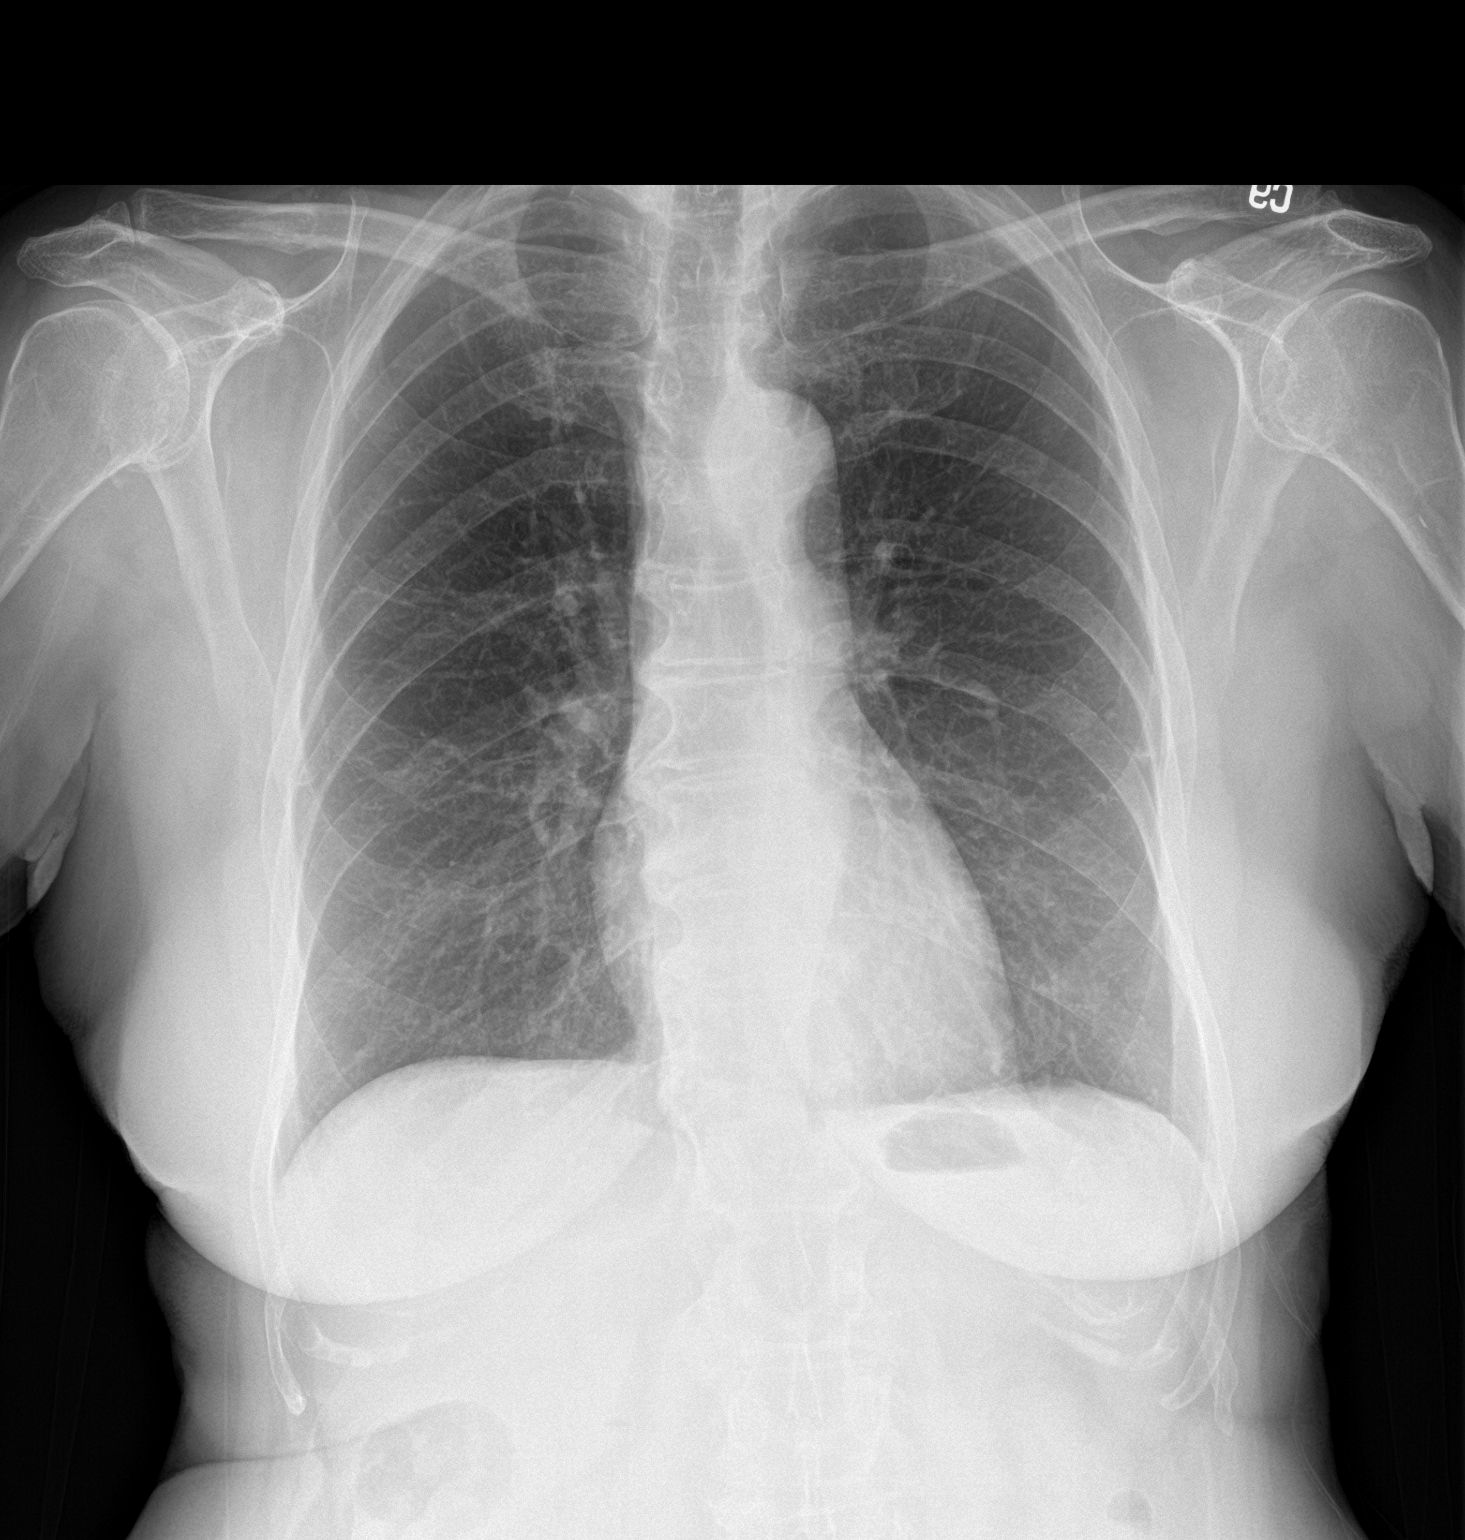
[im 2/2]
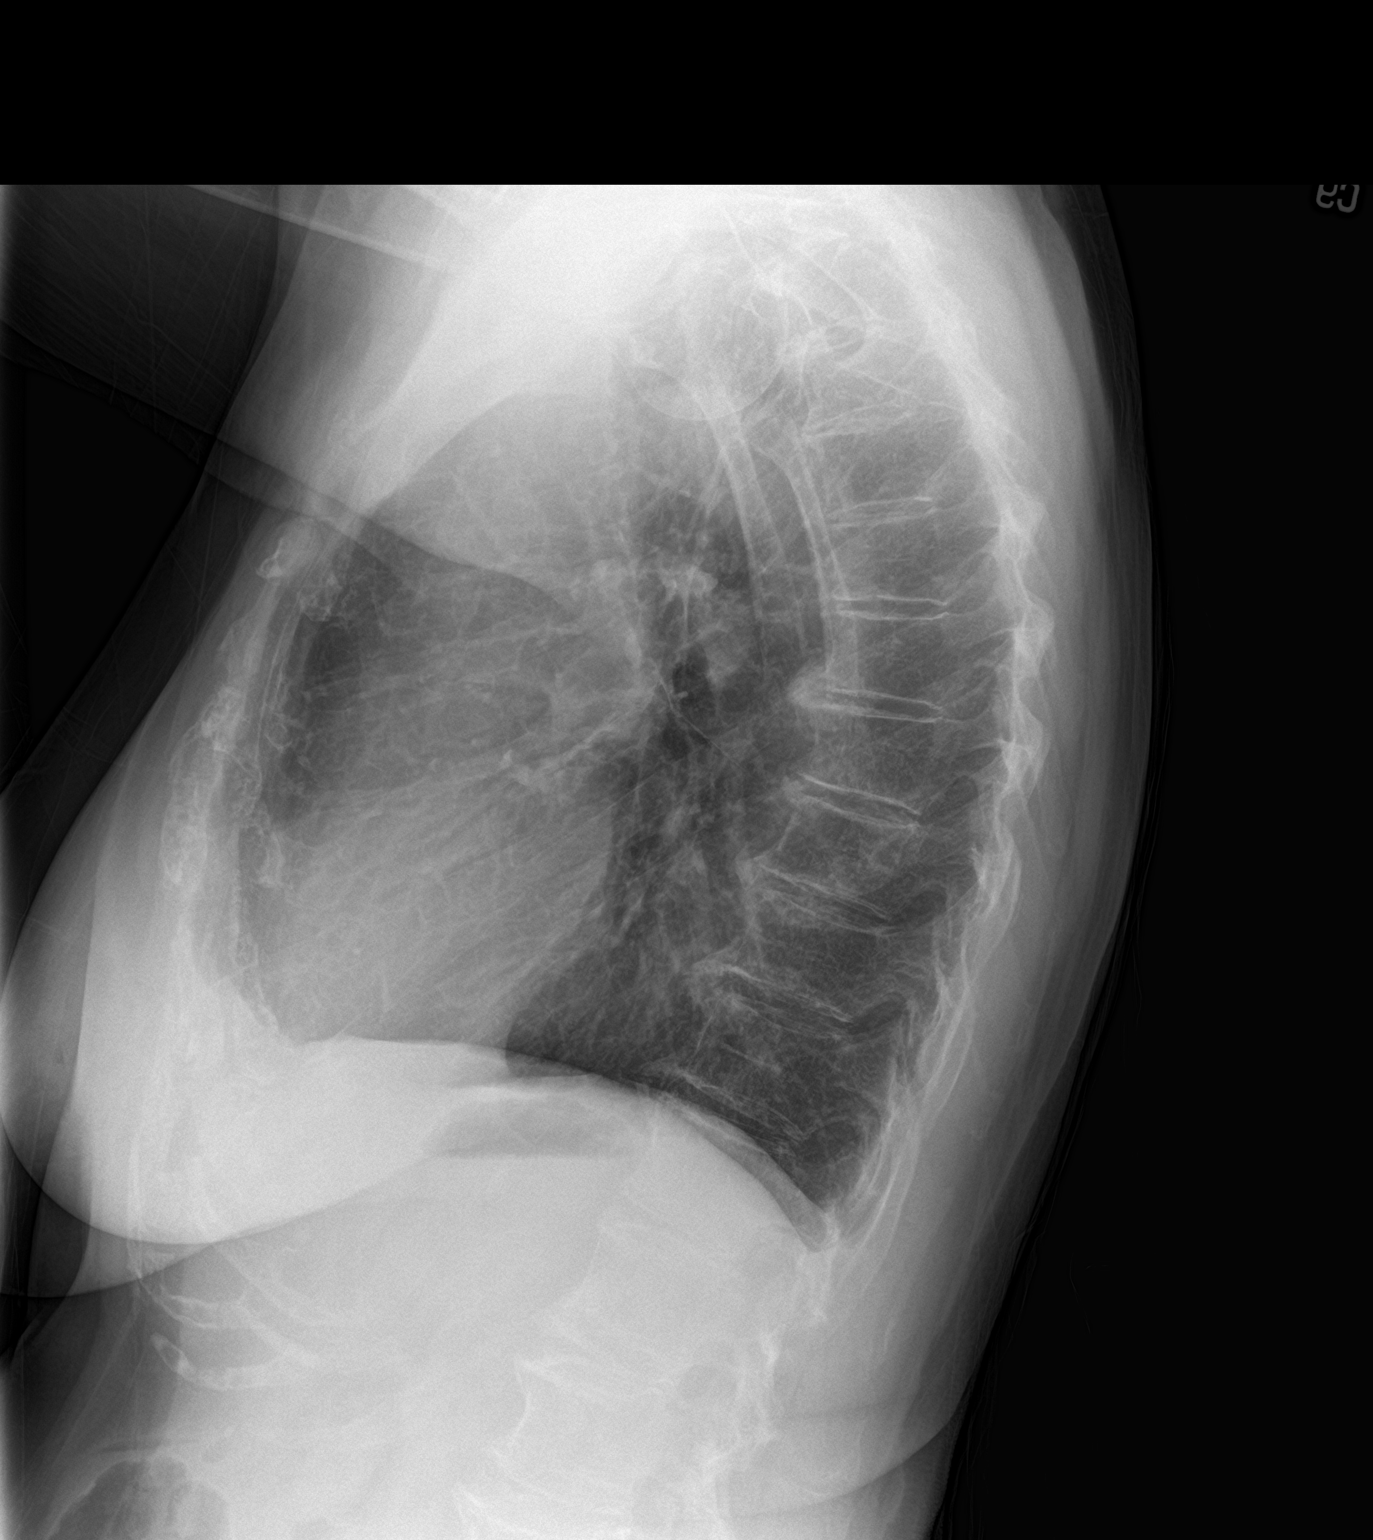

[2 of 2 positions shown; findings below may reference images not displayed]

FINDINGS: Cardiac silhouette is normal in size. No mediastinal or hilar
masses. No evidence of adenopathy.

Lungs are hyperexpanded but clear. No pleural effusion or
pneumothorax.

Skeletal structures are intact.
IMPRESSION: No active cardiopulmonary disease.

## 2020-06-19 ENCOUNTER — Telehealth: Payer: Self-pay | Admitting: Family Medicine

## 2020-06-19 ENCOUNTER — Encounter: Payer: Self-pay | Admitting: Family Medicine

## 2020-06-19 ENCOUNTER — Ambulatory Visit (INDEPENDENT_AMBULATORY_CARE_PROVIDER_SITE_OTHER): Payer: Medicare PPO | Admitting: Family Medicine

## 2020-06-19 ENCOUNTER — Other Ambulatory Visit: Payer: Self-pay

## 2020-06-19 VITALS — BP 144/82 | HR 62 | Temp 98.1°F | Wt 147.0 lb

## 2020-06-19 DIAGNOSIS — K529 Noninfective gastroenteritis and colitis, unspecified: Secondary | ICD-10-CM

## 2020-06-19 DIAGNOSIS — F419 Anxiety disorder, unspecified: Secondary | ICD-10-CM

## 2020-06-19 DIAGNOSIS — K629 Disease of anus and rectum, unspecified: Secondary | ICD-10-CM

## 2020-06-19 MED ORDER — DICYCLOMINE HCL 10 MG PO CAPS: 10.0000 mg | ORAL_CAPSULE | Freq: Three times a day (TID) | ORAL | 1 refills | Status: DC

## 2020-06-19 MED ORDER — ALPRAZOLAM 0.25 MG PO TABS: 0.2500 mg | ORAL_TABLET | Freq: Every evening | ORAL | 0 refills | Status: DC | PRN

## 2020-06-19 MED ORDER — METRONIDAZOLE 0.75 % EX GEL: 1.0000 "application " | Freq: Two times a day (BID) | CUTANEOUS | 0 refills | Status: DC

## 2020-06-19 NOTE — Progress Notes (Signed)
BP (!) 144/82   Pulse 62   Temp 98.1 F (36.7 C) (Oral)   Wt 147 lb (66.7 kg)   LMP  (LMP Unknown)   SpO2 98%   BMI 23.73 kg/m    Subjective:    Patient ID: Savannah [name], female    DOB: December 26, 1945, 74 y.o.   MRN: [id number]  HPI: Savannah [name] is a 74 y.o. female  Chief Complaint  Patient presents with  . Pain    pt states has had painful sore aroun anus for 3-4days now. pt concerned if the clobetasol ointment is causing this   Here today concerned about 3-4 days of anal pain. Started using neosporin on the area last night without obvious benefit. Notes no constipation or straining, has chronic diarrhea issues for years and has about 3-5 loose stools per day which does lend to irritation down there. Only new medication is recently started clobetasol through GYN for vulvitis and was told to apply daily for one month which has been helping her itching. Denies discharge, bleeding, swelling, fevers, chills.   Relevant past medical, surgical, family and social history reviewed and updated as indicated. Interim medical history since our last visit reviewed. Allergies and medications reviewed and updated.  Review of Systems  Per HPI unless specifically indicated above     Objective:    BP (!) 144/82   Pulse 62   Temp 98.1 F (36.7 C) (Oral)   Wt 147 lb (66.7 kg)   LMP  (LMP Unknown)   SpO2 98%   BMI 23.73 kg/m   Wt Readings from Last 3 Encounters:  06/19/20 147 lb (66.7 kg)  05/12/20 152 lb 9.6 oz (69.2 kg)  05/12/20 152 lb 9.6 oz (69.2 kg)    Physical Exam Vitals and nursing note reviewed. Exam conducted with a chaperone present.  Constitutional:      Appearance: Normal appearance. She is not ill-appearing.  HENT:     Head: Atraumatic.  Eyes:     Extraocular Movements: Extraocular movements intact.     Conjunctiva/sclera: Conjunctivae normal.  Cardiovascular:     Rate and Rhythm: Normal rate and regular rhythm.     Heart sounds: Normal heart sounds.  Pulmonary:      Effort: Pulmonary effort is normal.     Breath sounds: Normal breath sounds.  Genitourinary:   Musculoskeletal:        General: Normal range of motion.     Cervical back: Normal range of motion and neck supple.  Skin:    General: Skin is warm and dry.  Neurological:     Mental Status: She is alert and oriented to person, place, and time.  Psychiatric:        Mood and Affect: Mood normal.        Thought Content: Thought content normal.        Judgment: Judgment normal.     Results for orders placed or performed in visit on 05/12/20  Microscopic Examination   URINE  Result Value Ref Range   WBC, UA None seen 0 - 5 /hpf   RBC 0-2 0 - 2 /hpf   Epithelial Cells (non renal) 0-10 0 - 10 /hpf   Bacteria, UA None seen None seen/Few  CBC with Differential/Platelet  Result Value Ref Range   WBC 6.5 3.4 - 10.8 x10E3/uL   RBC 4.72 3.77 - 5.28 x10E6/uL   Hemoglobin 15.1 11.1 - 15.9 g/dL   Hematocrit 46.3 34.0 - 46.6 %  MCV 98 (H) 79 - 97 fL   MCH 32.0 26.6 - 33.0 pg   MCHC 32.6 31 - 35 g/dL   RDW 11.9 11.7 - 15.4 %   Platelets 201 150 - 450 x10E3/uL   Neutrophils 68 Not Estab. %   Lymphs 20 Not Estab. %   Monocytes 8 Not Estab. %   Eos 2 Not Estab. %   Basos 1 Not Estab. %   Neutrophils Absolute 4.5 1 - 7 x10E3/uL   Lymphocytes Absolute 1.3 0 - 3 x10E3/uL   Monocytes Absolute 0.5 0 - 0 x10E3/uL   EOS (ABSOLUTE) 0.1 0.0 - 0.4 x10E3/uL   Basophils Absolute 0.0 0 - 0 x10E3/uL   Immature Granulocytes 1 Not Estab. %   Immature Grans (Abs) 0.0 0.0 - 0.1 x10E3/uL  Comprehensive metabolic panel  Result Value Ref Range   Glucose 82 65 - 99 mg/dL   BUN 11 8 - 27 mg/dL   Creatinine, Ser 0.76 0.57 - 1.00 mg/dL   GFR calc non Af Amer 77 >59 mL/min/1.73   GFR calc Af Amer 89 >59 mL/min/1.73   BUN/Creatinine Ratio 14 12 - 28   Sodium 141 134 - 144 mmol/L   Potassium 4.1 3.5 - 5.2 mmol/L   Chloride 102 96 - 106 mmol/L   CO2 25 20 - 29 mmol/L   Calcium 9.1 8.7 - 10.3 mg/dL   Total  Protein 6.7 6.0 - 8.5 g/dL   Albumin 4.6 3.7 - 4.7 g/dL   Globulin, Total 2.1 1.5 - 4.5 g/dL   Albumin/Globulin Ratio 2.2 1.2 - 2.2   Bilirubin Total 0.4 0.0 - 1.2 mg/dL   Alkaline Phosphatase 61 48 - 121 IU/L   AST 17 0 - 40 IU/L   ALT 11 0 - 32 IU/L  Lipid Panel w/o Chol/HDL Ratio  Result Value Ref Range   Cholesterol, Total 224 (H) 100 - 199 mg/dL   Triglycerides 143 0 - 149 mg/dL   HDL 78 >39 mg/dL   VLDL Cholesterol Cal 25 5 - 40 mg/dL   LDL Chol Calc (NIH) 121 (H) 0 - 99 mg/dL  TSH  Result Value Ref Range   TSH 1.650 0.450 - 4.500 uIU/mL  UA/M w/rflx Culture, Routine   Specimen: Urine   URINE  Result Value Ref Range   Specific Gravity, UA 1.015 1.005 - 1.030   pH, UA 5.5 5.0 - 7.5   Color, UA Yellow Yellow   Appearance Ur Clear Clear   Leukocytes,UA Negative Negative   Protein,UA Negative Negative/Trace   Glucose, UA Negative Negative   Ketones, UA Negative Negative   RBC, UA Trace (A) Negative   Bilirubin, UA Negative Negative   Urobilinogen, Ur 0.2 0.2 - 1.0 mg/dL   Nitrite, UA Negative Negative   Microscopic Examination See below:       Assessment & Plan:   Problem List Items Addressed This Visit      Other   Chronic anxiety    Requesting refill on her xanax, one script typically lasts one year. Precautions reviewed      Relevant Medications   ALPRAZolam (XANAX) 0.25 MG tablet    Other Visit Diagnoses    Anal lesion    -  Primary   Suspect from irritation from long term clobetasol to tissue and chronic diarrhea. Avoid this area with the cream, flagyl gel and good hygiene reviewed   Chronic diarrhea       Suspect IBS D, will trial bentyl, probiotics, fiber regimen.  Avoid known triggers. F/u if not improving       Follow up plan: Return for as scheduled.

## 2020-06-19 NOTE — Assessment & Plan Note (Signed)
Requesting refill on her xanax, one script typically lasts one year. Precautions reviewed

## 2020-06-19 NOTE — Telephone Encounter (Signed)
Copied from [place] [phone]. Topic: General - Inquiry >> Jun 18, 2020 11:48 AM Alease Frame wrote: Reason for CRM: Virginie from [facility] called in stating pt was directed to contact insurance office to add provider on insurance card however with pt having the PPO plan there is no option for pt to do so . Please advise    Call back [phone]

## 2020-07-28 ENCOUNTER — Other Ambulatory Visit: Payer: Self-pay

## 2020-07-28 ENCOUNTER — Ambulatory Visit
Admission: RE | Admit: 2020-07-28 | Discharge: 2020-07-28 | Disposition: A | Discharge status: Home / Self Care | Payer: Medicare PPO | Source: Ambulatory Visit | Attending: Family Medicine | Admitting: Family Medicine

## 2020-07-28 DIAGNOSIS — Z1231 Encounter for screening mammogram for malignant neoplasm of breast: Secondary | ICD-10-CM | POA: Diagnosis present

## 2020-08-08 ENCOUNTER — Encounter: Payer: Self-pay | Admitting: Family Medicine

## 2020-08-27 ENCOUNTER — Other Ambulatory Visit: Payer: Self-pay

## 2020-08-27 ENCOUNTER — Encounter: Payer: Self-pay | Admitting: Emergency Medicine

## 2020-08-27 ENCOUNTER — Emergency Department: Payer: Medicare PPO

## 2020-08-27 ENCOUNTER — Emergency Department
Admission: EM | Admit: 2020-08-27 | Discharge: 2020-08-27 | Disposition: A | Discharge status: Home / Self Care | Payer: Medicare PPO | Attending: Emergency Medicine | Admitting: Emergency Medicine

## 2020-08-27 DIAGNOSIS — Z79899 Other long term (current) drug therapy: Secondary | ICD-10-CM | POA: Insufficient documentation

## 2020-08-27 DIAGNOSIS — I493 Ventricular premature depolarization: Secondary | ICD-10-CM | POA: Diagnosis not present

## 2020-08-27 DIAGNOSIS — I10 Essential (primary) hypertension: Secondary | ICD-10-CM | POA: Diagnosis not present

## 2020-08-27 DIAGNOSIS — Z87891 Personal history of nicotine dependence: Secondary | ICD-10-CM | POA: Diagnosis not present

## 2020-08-27 DIAGNOSIS — R002 Palpitations: Secondary | ICD-10-CM | POA: Diagnosis present

## 2020-08-27 DIAGNOSIS — E039 Hypothyroidism, unspecified: Secondary | ICD-10-CM | POA: Insufficient documentation

## 2020-08-27 DIAGNOSIS — Z79890 Hormone replacement therapy (postmenopausal): Secondary | ICD-10-CM | POA: Insufficient documentation

## 2020-08-27 LAB — CBC
HCT: 42.1 % (ref 36.0–46.0)
Hemoglobin: 14 g/dL (ref 12.0–15.0)
MCH: 32 pg (ref 26.0–34.0)
MCHC: 33.3 g/dL (ref 30.0–36.0)
MCV: 96.3 fL (ref 80.0–100.0)
Platelets: 175 10*3/uL (ref 150–400)
RBC: 4.37 MIL/uL (ref 3.87–5.11)
RDW: 12.4 % (ref 11.5–15.5)
WBC: 6.3 10*3/uL (ref 4.0–10.5)
nRBC: 0 % (ref 0.0–0.2)

## 2020-08-27 LAB — BASIC METABOLIC PANEL
Anion gap: 9 (ref 5–15)
BUN: 9 mg/dL (ref 8–23)
CO2: 27 mmol/L (ref 22–32)
Calcium: 8.8 mg/dL — ABNORMAL LOW (ref 8.9–10.3)
Chloride: 103 mmol/L (ref 98–111)
Creatinine, Ser: 0.7 mg/dL (ref 0.44–1.00)
GFR calc Af Amer: >60 mL/min (ref >60)
GFR calc non Af Amer: >60 mL/min (ref >60)
Glucose, Bld: 101 mg/dL — ABNORMAL HIGH (ref 70–99)
Potassium: 3.9 mmol/L (ref 3.5–5.1)
Sodium: 139 mmol/L (ref 135–145)

## 2020-08-27 LAB — MAGNESIUM: Magnesium: 2.2 mg/dL (ref 1.7–2.4)

## 2020-08-27 NOTE — ED Provider Notes (Signed)
[facility] Emergency Department Provider Note ____________________________________________   First MD Initiated Contact with Patient 08/27/20 1319     (approximate)  I have reviewed the triage vital signs and the nursing notes.  HISTORY  Chief Complaint Palpitations   HPI Savannah [name] is a 74 y.o. femalewho presents to the ED for evaluation of heart palpitations.   Chart review indicates hx anxiety, HLD, hypothyroidism, HTN.  Follows with cardiology due to hypertension and mild tricuspid regurgitation.  Patient presents to ED due to isolated high palpitations this morning.  Patient reports being in her typical state of health without recent illnesses.  She reports drinking coffee this morning while seated in the kitchen with her family when she developed heart palpitations.  She reports this lasted for about 2 hours prior to self resolving and has not recurred since then.  She denies any associated symptoms beyond her palpitations, including no chest pain, shortness of breath, syncope, headache, vomiting.   Says she feels well now and has no complaints and no symptoms.    Past Medical History:  Diagnosis Date  . Anxiety   . Cancer (HCC)    skin- squamous  . HOH (hard of hearing)    WEARS AIDS  . Hyperlipidemia   . Hypertension    CONTROLLED ON MEDS  . Hypothyroidism   . IBS (irritable bowel syndrome)   . MVP (mitral valve prolapse)   . OA (osteoarthritis) of neck    JOINTS  . Osteoporosis   . Thyroid disease     Patient Active Problem List   Diagnosis Date Noted  . Atherosclerosis of abdominal aorta ([place]) 01/02/2020  . Diarrhea 09/13/2019  . Right flank pain, chronic 09/13/2019  . Right knee pain 05/01/2018  . 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 GERD (gastroesophageal reflux disease) 04/28/2017  . Advance care planning 04/15/2017  . Family history of colon cancer 04/13/2016  . Chronic anxiety 04/13/2016  . Family history of  malignant neoplasm of digestive organs 04/13/2016  . Hypothyroidism 09/15/2015  . MVP (mitral valve prolapse) 09/15/2015  . Osteoporosis 09/15/2015  . OA (osteoarthritis) of neck 09/15/2015  . Hyperlipidemia 09/15/2015  . Essential hypertension 09/15/2015  . Migraines 09/15/2015  . Age-related osteoporosis without current pathological fracture 09/15/2015  . White coat hypertension 09/15/2015    Past Surgical History:  Procedure Laterality Date  . ANKLE FRACTURE SURGERY Left   . BREAST CYST ASPIRATION Right 90s  . COLONOSCOPY WITH PROPOFOL N/A 04/12/2016   Procedure: COLONOSCOPY WITH PROPOFOL;  Surgeon: Manya Silvas, MD;  Location: [facility] ENDOSCOPY;  Service: Endoscopy;  Laterality: N/A;  . FRACTURE SURGERY     ANKLE  . HAMMER TOE SURGERY Right 02/08/2018   Procedure: HAMMER TOE CORRECTION-5TH TOE, Excision soft tissue mass dorsal lateral right fifth toe and exostectomy dorsal lateral DIPJ fifth toe;  Surgeon: Samara Deist, DPM;  Location: [facility];  Service: Podiatry;  Laterality: Right;  IVA LOCAL  . moses surgery  09/2015  . SINUS SURGERY WITH INSTATRAK  10/2017   DEVIATED SEPTUM  . 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 Authorizing Provider  ALPRAZolam (XANAX) 0.25 MG tablet Take 1 tablet (0.25 mg total) by mouth at bedtime as needed for anxiety. 06/19/20   Volney American, PA-C  clobetasol ointment (TEMOVATE) 7.32 % Apply 1 application topically 2 (two) times daily.    [provider]  dicyclomine (BENTYL) 10 MG capsule Take 1 capsule (10 mg total) by mouth 4 (four) times daily -  before meals and at bedtime. 06/19/20   Volney American, PA-C  ibandronate (BONIVA) 150 MG tablet Take one tab. every 30 days,Take in the am with a 8oz glass of water,an empty stomach,nothing by mouth for 30 min, dont lie down for 30 min 02/13/20   Volney American, PA-C    levothyroxine (SYNTHROID) 75 MCG tablet Take 1 tablet (75 mcg total) by mouth daily before breakfast. 05/12/20   Volney American, PA-C  lovastatin (MEVACOR) 20 MG tablet Take 1 tab daily 05/12/20   Volney American, PA-C  metoprolol succinate (TOPROL-XL) 50 MG 24 hr tablet Take with or immediately following a meal.Take 1/2 tablet (25 mg) daily with or immediately following a meal. 05/12/20   Volney American, PA-C  metroNIDAZOLE (METROGEL) 0.75 % gel Apply 1 application topically 2 (two) times daily. 06/19/20   Volney American, PA-C  scopolamine (TRANSDERM-SCOP, 1.5 MG,) 1 MG/3DAYS Place 1 patch (1.5 mg total) onto the skin every 3 (three) days. 05/03/19   Volney American, PA-C    Allergies Amoxil [amoxicillin] and Penicillins  Family History  Problem Relation Age of Onset  . Hypertension Mother   . Colon cancer Mother   . Lung cancer Father   . Brain cancer Father   . Hyperlipidemia Brother   . Breast cancer Neg Hx     Social History Social History   Tobacco Use  . Smoking status: Former Smoker    Packs/day: 1.00    Years: 4.00    Pack years: 4.00    Types: Cigarettes    Quit date: 1970    Years since quitting: 51.7  . Smokeless tobacco: Never Used  Vaping Use  . Vaping Use: Never used  Substance Use Topics  . Alcohol use: Yes    Alcohol/week: 10.0 standard drinks    Types: 10 Glasses of wine per week  . Drug use: No    Review of Systems  Constitutional: No fever/chills Eyes: No visual changes. ENT: No sore throat. Cardiovascular: Denies chest pain.  Positive for palpitations. Respiratory: Denies shortness of breath. Gastrointestinal: No abdominal pain.  No nausea, no vomiting.  No diarrhea.  No constipation. Genitourinary: Negative for dysuria. Musculoskeletal: Negative for back pain. Skin: Negative for rash. Neurological: Negative for headaches, focal weakness or  numbness.   ____________________________________________   PHYSICAL EXAM:  VITAL SIGNS: Vitals:   08/27/20 1230 08/27/20 1415  BP: (!) 168/78 (!) 147/82  Pulse: 76 68  Resp: 16 16  Temp:    SpO2: 98% 98%      Constitutional: Alert and oriented. Well appearing and in no acute distress. Eyes: Conjunctivae are normal. PERRL. EOMI. Head: Atraumatic. Nose: No congestion/rhinnorhea. Mouth/Throat: Mucous membranes are moist.  Oropharynx non-erythematous. Neck: No stridor. No cervical spine tenderness to palpation. Cardiovascular: Normal rate, regular rhythm. Grossly normal heart sounds.  Good peripheral circulation. Respiratory: Normal respiratory effort.  No retractions. Lungs CTAB. Gastrointestinal: Soft , nondistended, nontender to palpation. No abdominal bruits. No CVA tenderness. Musculoskeletal: No lower extremity tenderness nor edema.  No joint effusions. No signs of acute trauma. Neurologic:  Normal speech and language. No gross focal neurologic deficits are appreciated. No gait instability noted. Skin:  Skin is warm, dry and intact. No rash noted. Psychiatric: Mood and affect are normal. Speech and behavior are normal.  ____________________________________________   LABS (all labs ordered are listed, but only abnormal  results are displayed)  Labs Reviewed  BASIC METABOLIC PANEL - Abnormal; Notable for the following components:      Result Value   Glucose, Bld 101 (*)    Calcium 8.8 (*)    All other components within normal limits  CBC  MAGNESIUM   ____________________________________________  12 Lead EKG Sinus rhythm, rate of 77 bpm.  Normal axis and intervals.  Couple PVCs, otherwise without evidence of acute ischemia.  ____________________________________________  RADIOLOGY  ED MD interpretation: 2 view CXR without evidence of acute cardiopulmonary pathology  Official radiology report(s): DG Chest 2 View  Result Date: 08/27/2020 CLINICAL DATA:  Cardiac  palpitations EXAM: CHEST - 2 VIEW COMPARISON:  December 11, 2019 FINDINGS: The lungs are clear. The heart size and pulmonary vascularity are normal. No adenopathy. There is degenerative change in the thoracic spine. IMPRESSION: No edema or airspace opacity. Cardiac silhouette within normal limits. Electronically Signed   By: Lowella Grip III M.D.   On: 08/27/2020 11:34    ____________________________________________   PROCEDURES and INTERVENTIONS  Procedure(s) performed (including Critical Care):  Procedures  Medications - No data to display  ____________________________________________   MDM / ED COURSE  Healthy 74 year old woman without coronary history presenting with isolated palpitations without pain, most attributable to frequent PVCs and without evidence of ACS, amenable to outpatient management.  Normal vital signs on room air.  Exam reassuring without evidence of acute derangements.  She has no distress, neurovascular deficits, irregular heart rhythms or other signs of acute pathology.  Blood work demonstrates normal electrolytes, including magnesium, and negative high-sensitivity troponin.  Her EKG is nonischemic and does demonstrate couple PVCs.  Her symptoms are likely due to increased frequency of PVCs in the setting of her caffeine usage this morning.  We discussed following up with her cardiologist to have Holter monitor attached to assess for dysrhythmias.  We discussed avoidance of caffeine.  We discussed return precautions for the ED and patient is medically stable for discharge home.      ____________________________________________   FINAL CLINICAL IMPRESSION(S) / ED DIAGNOSES  Final diagnoses:  Palpitations  Frequent PVCs     ED Discharge Orders    None       Areyanna Figeroa   Note:  This document was prepared using Dragon voice recognition software and may include unintentional dictation errors.   Vladimir Crofts, MD 08/27/20 [phone]

## 2020-08-27 NOTE — Telephone Encounter (Signed)
Late entry: Attempted to call [employer] but unable to reach agent. Patient informed no PCP required for PPO plan.

## 2020-08-27 NOTE — ED Triage Notes (Signed)
C/O palpitations since around 0845.  Takes metoprolol daily.  States feels weak and feels like she has to catch her breath.  AAOx3.  Skin warm and dry.. No SOB/ DOE. NAD

## 2020-08-27 NOTE — ED Notes (Signed)
See triage note  Presents with "palpitations"  States she noticed this am that her heart rate was slightly irreg  States no hx of same  No pain

## 2020-08-27 NOTE — Discharge Instructions (Signed)
You were seen in the ED because of your heart palpitations.  Thankfully, there is no evidence of damage or strain to your heart and isolated palpitations without pain is often a benign symptom.  Your EKG shows a couple early beats or PVCs.  These, as we discussed, can certainly cause your palpitations sensation.  Again, this is not related to heart attack or damage to the heart.  Please avoid caffeine, including coffee, as we discussed.  I would recommend you follow-up with your cardiologist to discuss having another Holter monitor attached to monitor your heartbeat and rhythm over a matter of days to see the severity of this.  You develop any chest pains with your palpitations, particularly with fevers, passing out or throwing up, please return to the ED.

## 2020-09-01 ENCOUNTER — Ambulatory Visit: Payer: Self-pay

## 2020-09-01 NOTE — Telephone Encounter (Signed)
Spoke with pt offered her multiple apt times with Kathrine Haddock on Thursday due to apt with other providers unavailable.Pt refused apt. Pt stated she just wanted medication just sent in now to pharmacy.I alerted pt of our policy. Pt asked where she could go to be seen today I Advised pt to go to urgent care or ED. Pt understood and confirmed understanding .

## 2020-09-01 NOTE — Telephone Encounter (Signed)
Thank you, yes patient would need virtual appointment for this.  If refusing then definitely recommend urgent care visit today for further work-up and assessment + prescriptions if needed.

## 2020-09-01 NOTE — Telephone Encounter (Signed)
  Pt. Reports she has a cough. Has tested negative for COVID 19. No fever. Cough is non-productive. Has runny nose with clear discharge.No availability until Thursday. Request an antibiotic and cough medication be sent to her pharmacy. States she has been up 2 nights with the cough. Requests to speak "to someone in the practice now." Warm transfer to [place] in the practice." Answer Assessment - Initial Assessment Questions 1. ONSET: "When did the cough begin?"      Started Friday 2. SEVERITY: "How bad is the cough today?"      Moderate 3. SPUTUM: "Describe the color of your sputum" (none, dry cough; clear, white, yellow, green)     No 4. HEMOPTYSIS: "Are you coughing up any blood?" If so ask: "How much?" (flecks, streaks, tablespoons, etc.)     No 5. DIFFICULTY BREATHING: "Are you having difficulty breathing?" If Yes, ask: "How bad is it?" (e.g., mild, moderate, severe)    - MILD: No SOB at rest, mild SOB with walking, speaks normally in sentences, can lay down, no retractions, pulse < 100.    - MODERATE: SOB at rest, SOB with minimal exertion and prefers to sit, cannot lie down flat, speaks in phrases, mild retractions, audible wheezing, pulse 100-120.    - SEVERE: Very SOB at rest, speaks in single words, struggling to breathe, sitting hunched forward, retractions, pulse > 120      With coughing spells 6. FEVER: "Do you have a fever?" If Yes, ask: "What is your temperature, how was it measured, and when did it start?"     None now - 99 earlier 7. CARDIAC HISTORY: "Do you have any history of heart disease?" (e.g., heart attack, congestive heart failure)      No 8. LUNG HISTORY: "Do you have any history of lung disease?"  (e.g., pulmonary embolus, asthma, emphysema)     No 9. PE RISK FACTORS: "Do you have a history of blood clots?" (or: recent major surgery, recent prolonged travel, bedridden)     No 10. OTHER SYMPTOMS: "Do you have any other symptoms?" (e.g., runny nose, wheezing, chest  pain)       Runny nose  11. PREGNANCY: "Is there any chance you are pregnant?" "When was your last menstrual period?"       No 12. TRAVEL: "Have you traveled out of the country in the last month?" (e.g., travel history, exposures)       No  Protocols used: COUGH - ACUTE NON-PRODUCTIVE-A-AH

## 2020-09-01 NOTE — Telephone Encounter (Signed)
Previously told pt to go to urgent care pt understood and confirmed understanding.

## 2020-09-02 ENCOUNTER — Other Ambulatory Visit: Payer: Self-pay

## 2020-09-02 ENCOUNTER — Ambulatory Visit
Admission: RE | Admit: 2020-09-02 | Discharge: 2020-09-02 | Disposition: A | Discharge status: Home / Self Care | Payer: Medicare PPO | Source: Ambulatory Visit | Attending: Family Medicine | Admitting: Family Medicine

## 2020-09-02 VITALS — BP 128/77 | HR 84 | Temp 100.0°F | Resp 17

## 2020-09-02 DIAGNOSIS — J019 Acute sinusitis, unspecified: Secondary | ICD-10-CM

## 2020-09-02 DIAGNOSIS — J209 Acute bronchitis, unspecified: Secondary | ICD-10-CM | POA: Diagnosis not present

## 2020-09-02 MED ORDER — BENZONATATE 100 MG PO CAPS: 100.0000 mg | ORAL_CAPSULE | Freq: Three times a day (TID) | ORAL | 0 refills | Status: DC

## 2020-09-02 MED ORDER — AZITHROMYCIN 250 MG PO TABS: 250.0000 mg | ORAL_TABLET | Freq: Every day | ORAL | 0 refills | Status: DC

## 2020-09-02 MED ORDER — ALBUTEROL SULFATE HFA 108 (90 BASE) MCG/ACT IN AERS: 2.0000 | INHALATION_SPRAY | RESPIRATORY_TRACT | 0 refills | Status: DC | PRN

## 2020-09-02 MED ORDER — PREDNISONE 10 MG (21) PO TBPK: ORAL_TABLET | Freq: Every day | ORAL | 0 refills | Status: AC

## 2020-09-02 NOTE — ED Triage Notes (Addendum)
Pt is here with nasal congestion and a deep cough that started 4 days ago, pt has taken OTC meds with cough drops to relieve discomfort. Pt was COVID tested Saturday & her results were Negative.

## 2020-09-02 NOTE — ED Provider Notes (Addendum)
[place]   [id number] 09/02/20 Arrival Time: 1210   SUBJECTIVE:  Savannah [name] is a 74 y.o. female who presents with complaint of nasal congestion, post-nasal drainage, and a persistent cough. Onset abrupt approximately 7 days ago. Overall fatigued. SOB: none. Wheezing: moderate. Has negative history of Covid. Has completed Covid vaccines. OTC treatment: lemon, honey, whiskey. Reports that her cough has been keeping her awake at night. Reports negative Covid test x 4 days ago. Social History   Tobacco Use  Smoking Status Former Smoker  . Packs/day: 1.00  . Years: 4.00  . Pack years: 4.00  . Types: Cigarettes  . Quit date: 76  . Years since quitting: 51.7  Smokeless Tobacco Never Used    ROS: As per HPI.   OBJECTIVE:  Vitals:   09/02/20 1259  BP: 128/77  Pulse: 84  Resp: 17  Temp: 100 F (37.8 C)  TempSrc: Oral  SpO2: 93%     General appearance: alert; no distress HEENT: nasal congestion; clear runny nose; throat irritation secondary to post-nasal drainage Neck: supple without LAD Lungs: mild wheezing throughout bilateral lung fields, diminished lung sound to bilateral bases Skin: warm and dry Psychological: alert and cooperative; normal mood and affect  Results for orders placed or performed during the hospital encounter of 53/97/67  Basic metabolic panel  Result Value Ref Range   Sodium 139 135 - 145 mmol/L   Potassium 3.9 3.5 - 5.1 mmol/L   Chloride 103 98 - 111 mmol/L   CO2 27 22 - 32 mmol/L   Glucose, Bld 101 (H) 70 - 99 mg/dL   BUN 9 8 - 23 mg/dL   Creatinine, Ser 0.70 0.44 - 1.00 mg/dL   Calcium 8.8 (L) 8.9 - 10.3 mg/dL   GFR calc non Af Amer >60 >60 mL/min   GFR calc Af Amer >60 >60 mL/min   Anion gap 9 5 - 15  CBC  Result Value Ref Range   WBC 6.3 4.0 - 10.5 K/uL   RBC 4.37 3.87 - 5.11 MIL/uL   Hemoglobin 14.0 12.0 - 15.0 g/dL   HCT 42.1 36 - 46 %   MCV 96.3 80.0 - 100.0 fL   MCH 32.0 26.0 - 34.0 pg   MCHC 33.3 30.0 - 36.0 g/dL     RDW 12.4 11.5 - 15.5 %   Platelets 175 150 - 400 K/uL   nRBC 0.0 0.0 - 0.2 %  Magnesium  Result Value Ref Range   Magnesium 2.2 1.7 - 2.4 mg/dL    Labs Reviewed - No data to display  No results found.  Allergies  Allergen Reactions  . Amoxil [Amoxicillin] Itching  . Penicillins Other (See Comments)    "passed out"    Past Medical History:  Diagnosis Date  . Anxiety   . Cancer (HCC)    skin- squamous  . HOH (hard of hearing)    WEARS AIDS  . Hyperlipidemia   . Hypertension    CONTROLLED ON MEDS  . Hypothyroidism   . IBS (irritable bowel syndrome)   . MVP (mitral valve prolapse)   . OA (osteoarthritis) of neck    JOINTS  . Osteoporosis   . Thyroid disease    Social History   Socioeconomic History  . Marital status: Married    Spouse name: Not on file  . Number of children: Not on file  . Years of education: Not on file  . Highest education level: Master's degree (e.g., MA, MS, MEng, MEd, MSW,  MBA)  Occupational History  . Occupation: retired  Tobacco Use  . Smoking status: Former Smoker    Packs/day: 1.00    Years: 4.00    Pack years: 4.00    Types: Cigarettes    Quit date: 1970    Years since quitting: 51.7  . Smokeless tobacco: Never Used  Vaping Use  . Vaping Use: Never used  Substance and Sexual Activity  . Alcohol use: Yes    Alcohol/week: 10.0 standard drinks    Types: 10 Glasses of wine per week  . Drug use: No  . Sexual activity: Yes    Birth control/protection: None    Comment: Married  Other Topics Concern  . Not on file  Social History Narrative  . Not on file   Social Determinants of Health   Financial Resource Strain: Low Risk   . Difficulty of Paying Living Expenses: Not hard at all  Food Insecurity: No Food Insecurity  . Worried About Charity fundraiser in the Last Year: Never true  . Ran Out of Food in the Last Year: Never true  Transportation Needs: No Transportation Needs  . Lack of Transportation (Medical): No  .  Lack of Transportation (Non-Medical): No  Physical Activity: Insufficiently Active  . Days of Exercise per Week: 3 days  . Minutes of Exercise per Session: 30 min  Stress:   . Feeling of Stress : Not on file  Social Connections: Moderately Isolated  . Frequency of Communication with Friends and Family: More than three times a week  . Frequency of Social Gatherings with Friends and Family: More than three times a week  . Attends Religious Services: Never  . Active Member of Clubs or Organizations: No  . Attends Archivist Meetings: Never  . Marital Status: Married  Human resources officer Violence:   . Fear of Current or Ex-Partner: Not on file  . Emotionally Abused: Not on file  . Physically Abused: Not on file  . Sexually Abused: Not on file   Family History  Problem Relation Age of Onset  . Hypertension Mother   . Colon cancer Mother   . Lung cancer Father   . Brain cancer Father   . Hyperlipidemia Brother   . Breast cancer Neg Hx      ASSESSMENT & PLAN:  1. Acute bronchitis, unspecified organism   2. Acute non-recurrent sinusitis, unspecified location     Meds ordered this encounter  Medications  . azithromycin (ZITHROMAX) 250 MG tablet    Sig: Take 1 tablet (250 mg total) by mouth daily. Take first 2 tablets together, then 1 every day until finished.    Dispense:  6 tablet    Refill:  0    Order Specific Question:   Supervising Provider    Answer:   Chase Picket [id number]  . predniSONE (STERAPRED UNI-PAK 21 TAB) 10 MG (21) TBPK tablet    Sig: Take by mouth daily for 6 days. Take 6 tablets on day 1, 5 tablets on day 2, 4 tablets on day 3, 3 tablets on day 4, 2 tablets on day 5, 1 tablet on day 6    Dispense:  21 tablet    Refill:  0    Order Specific Question:   Supervising Provider    Answer:   Chase Picket [id number]  . benzonatate (TESSALON) 100 MG capsule    Sig: Take 1 capsule (100 mg total) by mouth every 8 (eight) hours.    Dispense:  21  capsule    Refill:  0    Order Specific Question:   Supervising Provider    Answer:   Chase Picket [id number]  . albuterol (VENTOLIN HFA) 108 (90 Base) MCG/ACT inhaler    Sig: Inhale 2 puffs into the lungs every 4 (four) hours as needed for wheezing or shortness of breath.    Dispense:  18 g    Refill:  0    Order Specific Question:   Supervising Provider    Answer:   Chase Picket [id number]   Prescribed azithromycin Prescribed steroid taper Prescribed tessalon perles Prescribed albuterol inhaler Will treat with above therapy given duration of symptoms.  Cough medication sedation precautions.  OTC symptom care as needed. Will plan f/u with PCP or here as needed.  Reviewed expectations re: course of current medical issues. Questions answered. Outlined signs and symptoms indicating need for more acute intervention. Patient verbalized understanding. After Visit Summary given.    Faustino Congress, NP 09/02/20 [place]    Faustino Congress, NP 09/02/20 1346

## 2020-09-02 NOTE — Discharge Instructions (Addendum)
You have bronchitis and a sinus infection  I have sent in azithromycin. Take 2 tablets today. Take one tablet for the next 4 days.  I have sent in a prednisone taper for you to take for 6 days. 6 tablets on day one, 5 tablets on day two, 4 tablets on day three, 3 tablets on day four, 2 tablets on day five, and 1 tablet on day six.  Use the albuterol inhaler 2 puffs every 4-6 hours as needed for wheezing.   Follow up with this office or with primary care if you are not improving over the next few days  Follow up with the ER for high fever, trouble swallowing, trouble breathing, other concerning symptoms

## 2020-09-12 ENCOUNTER — Ambulatory Visit
Admission: EM | Admit: 2020-09-12 | Discharge: 2020-09-12 | Disposition: A | Discharge status: Home / Self Care | Payer: Medicare PPO

## 2020-09-12 DIAGNOSIS — R030 Elevated blood-pressure reading, without diagnosis of hypertension: Secondary | ICD-10-CM

## 2020-09-12 DIAGNOSIS — R21 Rash and other nonspecific skin eruption: Secondary | ICD-10-CM

## 2020-09-12 NOTE — ED Provider Notes (Signed)
[place]    CSN: [id number] Arrival date & time: 09/12/20  1350      History   Chief Complaint Chief Complaint  Patient presents with  . Rash    HPI Savannah [name] is a 74 y.o. female.   Patient presents with a rash on her arms and legs x1 week.  No known cause.  She states the rash is mildly pruritic and not painful.  She has been treating at home with cortisone cream and alcohol swabs.  Parts of the rash have begun to fade and clear up.  She denies fever, chills, sore throat, cough, shortness of breath, vomiting, diarrhea, or other symptoms.  No new products or foods but patient recently has been on new medications; Patient was seen here on 09/02/2020 by NP Zigmund Daniel; diagnosed with acute bronchitis and acute sinusitis; treated with Zithromax, prednisone, Tessalon Perles, albuterol inhaler.  Patient's medical history includes hypertension, hypothyroid, osteoarthritis, HOH, mitral valve prolapse, IBS, anxiety.  The history is provided by the patient.    Past Medical History:  Diagnosis Date  . Anxiety   . Cancer (HCC)    skin- squamous  . HOH (hard of hearing)    WEARS AIDS  . Hyperlipidemia   . Hypertension    CONTROLLED ON MEDS  . Hypothyroidism   . IBS (irritable bowel syndrome)   . MVP (mitral valve prolapse)   . OA (osteoarthritis) of neck    JOINTS  . Osteoporosis   . Thyroid disease     Patient Active Problem List   Diagnosis Date Noted  . Atherosclerosis of abdominal aorta ([place]) 01/02/2020  . Diarrhea 09/13/2019  . Right flank pain, chronic 09/13/2019  . Right knee pain 05/01/2018  . 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 GERD (gastroesophageal reflux disease) 04/28/2017  . Advance care planning 04/15/2017  . Family history of colon cancer 04/13/2016  . Chronic anxiety 04/13/2016  . Family history of malignant neoplasm of digestive organs 04/13/2016  . Hypothyroidism 09/15/2015  . MVP (mitral valve prolapse) 09/15/2015  . Osteoporosis  09/15/2015  . OA (osteoarthritis) of neck 09/15/2015  . Hyperlipidemia 09/15/2015  . Essential hypertension 09/15/2015  . Migraines 09/15/2015  . Age-related osteoporosis without current pathological fracture 09/15/2015  . White coat hypertension 09/15/2015    Past Surgical History:  Procedure Laterality Date  . ANKLE FRACTURE SURGERY Left   . BREAST CYST ASPIRATION Right 90s  . COLONOSCOPY WITH PROPOFOL N/A 04/12/2016   Procedure: COLONOSCOPY WITH PROPOFOL;  Surgeon: Manya Silvas, MD;  Location: [facility] ENDOSCOPY;  Service: Endoscopy;  Laterality: N/A;  . FRACTURE SURGERY     ANKLE  . HAMMER TOE SURGERY Right 02/08/2018   Procedure: HAMMER TOE CORRECTION-5TH TOE, Excision soft tissue mass dorsal lateral right fifth toe and exostectomy dorsal lateral DIPJ fifth toe;  Surgeon: Samara Deist, DPM;  Location: [facility];  Service: Podiatry;  Laterality: Right;  IVA LOCAL  . moses surgery  09/2015  . SINUS SURGERY WITH INSTATRAK  10/2017   DEVIATED SEPTUM  . 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    OB History   No obstetric history on file.      Home Medications    Prior to Admission medications   Medication Sig Start Date End Date Taking? Authorizing Provider  albuterol (VENTOLIN HFA) 108 (90 Base) MCG/ACT inhaler Inhale 2 puffs into the lungs every 4 (four) hours as needed for wheezing or shortness of breath. 09/02/20   Faustino Congress, NP  ALPRAZolam (XANAX) 0.25 MG tablet Take 1 tablet (0.25 mg total) by mouth at bedtime as needed for anxiety. 06/19/20   Volney American, PA-C  azithromycin (ZITHROMAX) 250 MG tablet Take 1 tablet (250 mg total) by mouth daily. Take first 2 tablets together, then 1 every day until finished. 09/02/20   Faustino Congress, NP  benzonatate (TESSALON) 100 MG capsule Take 1 capsule (100 mg total) by mouth every 8 (eight) hours. 09/02/20   Faustino Congress, NP  clobetasol ointment (TEMOVATE) 1.88 % Apply 1 application  topically 2 (two) times daily.    [provider]  dicyclomine (BENTYL) 10 MG capsule Take 1 capsule (10 mg total) by mouth 4 (four) times daily -  before meals and at bedtime. 06/19/20   Volney American, PA-C  ibandronate (BONIVA) 150 MG tablet Take one tab. every 30 days,Take in the am with a 8oz glass of water,an empty stomach,nothing by mouth for 30 min, dont lie down for 30 min 02/13/20   Volney American, PA-C  levothyroxine (SYNTHROID) 75 MCG tablet Take 1 tablet (75 mcg total) by mouth daily before breakfast. 05/12/20   Volney American, PA-C  lovastatin (MEVACOR) 20 MG tablet Take 1 tab daily 05/12/20   Volney American, PA-C  metoprolol succinate (TOPROL-XL) 50 MG 24 hr tablet Take with or immediately following a meal.Take 1/2 tablet (25 mg) daily with or immediately following a meal. 05/12/20   Volney American, PA-C  metroNIDAZOLE (METROGEL) 0.75 % gel Apply 1 application topically 2 (two) times daily. 06/19/20   Volney American, PA-C  scopolamine (TRANSDERM-SCOP, 1.5 MG,) 1 MG/3DAYS Place 1 patch (1.5 mg total) onto the skin every 3 (three) days. 05/03/19   Volney American, PA-C    Family History Family History  Problem Relation Age of Onset  . Hypertension Mother   . Colon cancer Mother   . Lung cancer Father   . Brain cancer Father   . Hyperlipidemia Brother   . Breast cancer Neg Hx     Social History Social History   Tobacco Use  . Smoking status: Former Smoker    Packs/day: 1.00    Years: 4.00    Pack years: 4.00    Types: Cigarettes    Quit date: 1970    Years since quitting: 51.7  . Smokeless tobacco: Never Used  Vaping Use  . Vaping Use: Never used  Substance Use Topics  . Alcohol use: Yes    Alcohol/week: 10.0 standard drinks    Types: 10 Glasses of wine per week  . Drug use: No     Allergies   Amoxil [amoxicillin] and Penicillins   Review of Systems Review of Systems  Constitutional: Negative for  chills and fever.  HENT: Negative for ear pain and sore throat.   Eyes: Negative for pain and visual disturbance.  Respiratory: Negative for cough and shortness of breath.   Cardiovascular: Negative for chest pain and palpitations.  Gastrointestinal: Negative for abdominal pain and vomiting.  Genitourinary: Negative for dysuria and hematuria.  Musculoskeletal: Negative for arthralgias and back pain.  Skin: Positive for rash. Negative for color change.  Neurological: Negative for seizures and syncope.  All other systems reviewed and are negative.    Physical Exam Triage Vital Signs ED Triage Vitals  Enc Vitals Group     BP 09/12/20 1502 (!) 162/84     Pulse Rate 09/12/20 1502 76     Resp 09/12/20 1502 16     Temp  09/12/20 1502 98.2 F (36.8 C)     Temp src --      SpO2 09/12/20 1502 98 %     Weight --      Height --      Head Circumference --      Peak Flow --      Pain Score 09/12/20 1457 3     Pain Loc --      Pain Edu? --      Excl. in [place]? --    No data found.  Updated Vital Signs BP (!) 162/84   Pulse 76   Temp 98.2 F (36.8 C)   Resp 16   LMP  (LMP Unknown)   SpO2 98%   Visual Acuity Right Eye Distance:   Left Eye Distance:   Bilateral Distance:    Right Eye Near:   Left Eye Near:    Bilateral Near:     Physical Exam Vitals and nursing note reviewed.  Constitutional:      General: She is not in acute distress.    Appearance: She is well-developed. She is not ill-appearing.  HENT:     Head: Normocephalic and atraumatic.     Mouth/Throat:     Mouth: Mucous membranes are moist.  Eyes:     Conjunctiva/sclera: Conjunctivae normal.  Cardiovascular:     Rate and Rhythm: Normal rate and regular rhythm.     Heart sounds: No murmur heard.   Pulmonary:     Effort: Pulmonary effort is normal. No respiratory distress.     Breath sounds: Normal breath sounds.  Abdominal:     Palpations: Abdomen is soft.     Tenderness: There is no abdominal tenderness.    Musculoskeletal:     Cervical back: Neck supple.  Skin:    General: Skin is warm and dry.     Findings: Rash present.     Comments: Red maculopapular rash primarily on legs but few scattered on arms also.  No drainage or open wounds.  Neurological:     General: No focal deficit present.     Mental Status: She is alert and oriented to person, place, and time.     Gait: Gait normal.  Psychiatric:        Mood and Affect: Mood normal.        Behavior: Behavior normal.      UC Treatments / Results  Labs (all labs ordered are listed, but only abnormal results are displayed) Labs Reviewed - No data to display  EKG   Radiology No results found.  Procedures Procedures (including critical care time)  Medications Ordered in UC Medications - No data to display  Initial Impression / Assessment and Plan / UC Course  I have reviewed the triage vital signs and the nursing notes.  Pertinent labs & imaging results that were available during my care of the patient were reviewed by me and considered in my medical decision making (see chart for details).   Rash.  Elevated blood pressure reading.  Instructed patient to take Benadryl for the next 2 to 3 days; Precautions for drowsiness with this medication discussed with patient.  Discussed that her blood pressure is elevated today needs to be rechecked by her PCP in 2 to 4 weeks.  Patient agrees to plan of care.   Final Clinical Impressions(s) / UC Diagnoses   Final diagnoses:  Rash and nonspecific skin eruption  Elevated blood pressure reading     Discharge Instructions  Take Benadryl as discussed.  Do not drive, operate machinery, or drink alcohol with this medication as it may cause drowsiness.  Follow-up with your primary care provider if your symptoms are not improving.  Your blood pressure is elevated today at 162/84.  Please have this rechecked by your primary care provider in 2-4 weeks.         ED Prescriptions     None     PDMP not reviewed this encounter.   Sharion Balloon, NP 09/12/20 1540

## 2020-09-12 NOTE — ED Triage Notes (Signed)
Patient reports she is feeling better from her 09/02/20 visit but has a rash that is continuing to spread x1 week

## 2020-09-12 NOTE — Discharge Instructions (Addendum)
Take Benadryl as discussed.  Do not drive, operate machinery, or drink alcohol with this medication as it may cause drowsiness.  Follow-up with your primary care provider if your symptoms are not improving.  Your blood pressure is elevated today at 162/84.  Please have this rechecked by your primary care provider in 2-4 weeks.

## 2020-10-07 ENCOUNTER — Other Ambulatory Visit (HOSPITAL_COMMUNITY): Payer: Self-pay | Admitting: Gastroenterology

## 2020-10-07 ENCOUNTER — Other Ambulatory Visit: Payer: Self-pay | Admitting: Gastroenterology

## 2020-10-07 DIAGNOSIS — K7689 Other specified diseases of liver: Secondary | ICD-10-CM

## 2020-10-10 ENCOUNTER — Other Ambulatory Visit: Payer: Self-pay

## 2020-10-10 ENCOUNTER — Ambulatory Visit
Admission: RE | Admit: 2020-10-10 | Discharge: 2020-10-10 | Disposition: A | Discharge status: Home / Self Care | Payer: Medicare PPO | Source: Ambulatory Visit | Attending: Gastroenterology | Admitting: Gastroenterology

## 2020-10-10 DIAGNOSIS — K7689 Other specified diseases of liver: Secondary | ICD-10-CM | POA: Diagnosis present

## 2020-10-13 DIAGNOSIS — I493 Ventricular premature depolarization: Secondary | ICD-10-CM | POA: Insufficient documentation

## 2020-11-06 IMAGING — US US ABDOMEN COMPLETE
1 series · 14 of 25 positions shown · non-contrast
Comparison: CT abdomen pelvis, 10/01/2019

CLINICAL DATA: Liver cyst

EXAM:
ABDOMEN ULTRASOUND COMPLETE

[Series 1: us abdomen complete · 0.16mm/px · 14 of 94 slices shown]
[im 1/94]
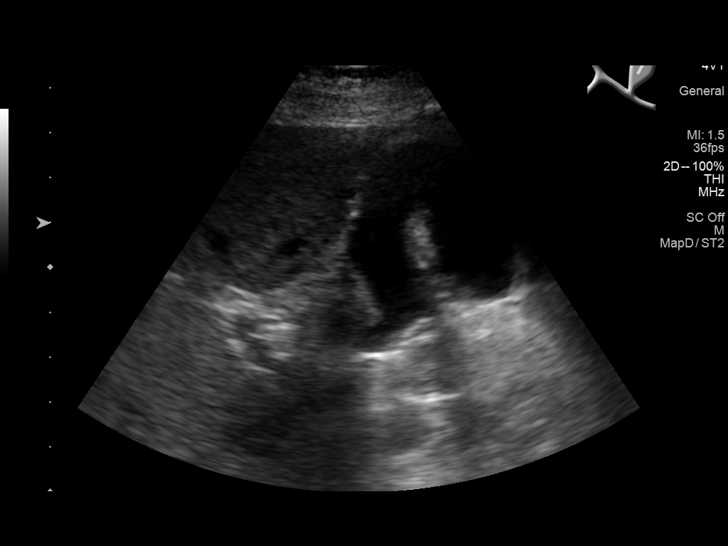
[im 8/94]
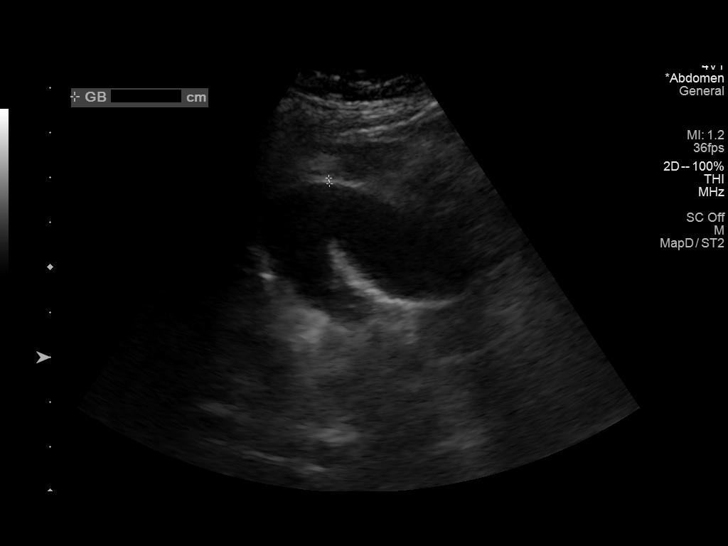
[im 16/94]
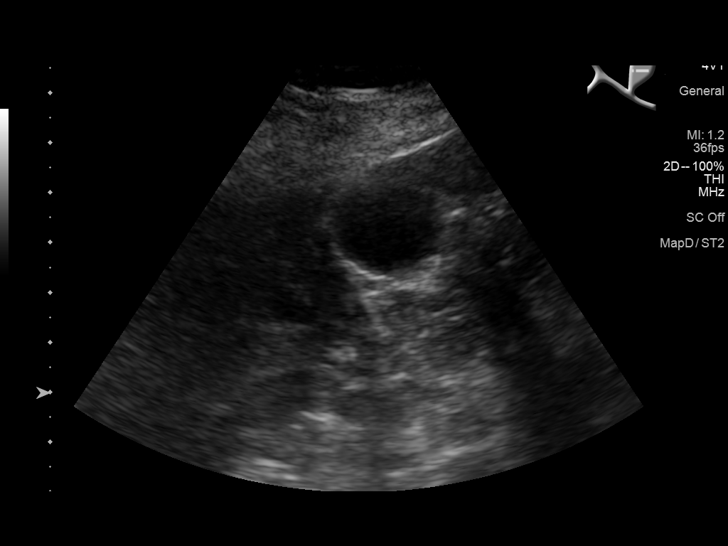
[im 24/94]
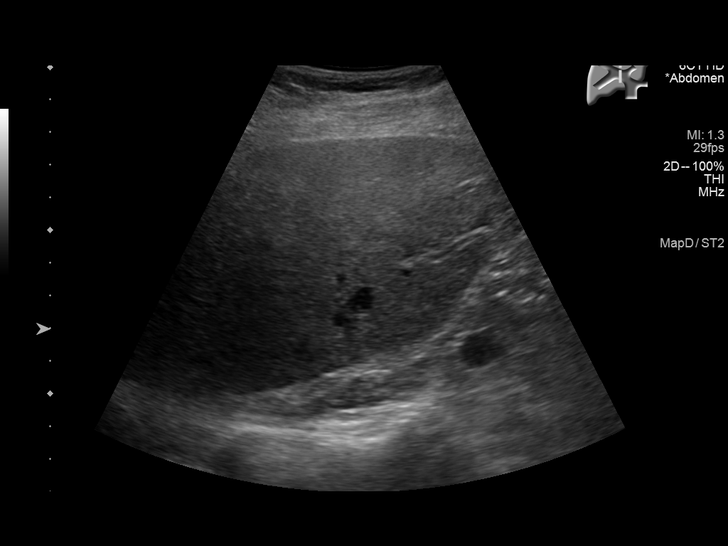
[im 32/94]
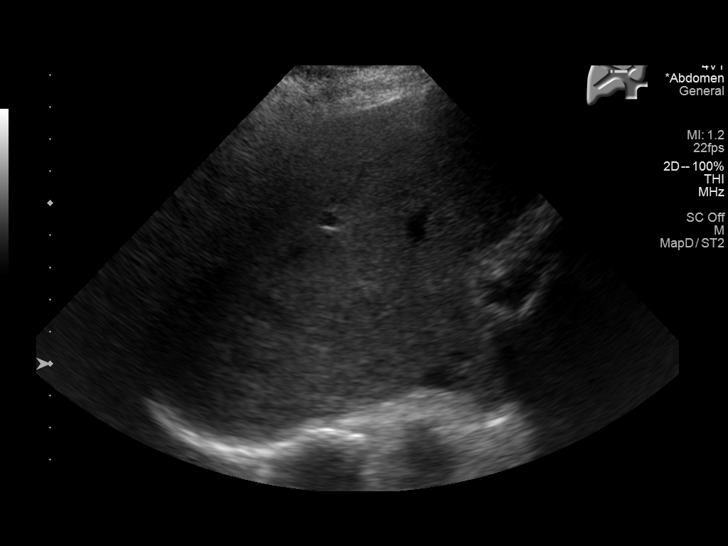
[im 35/94]
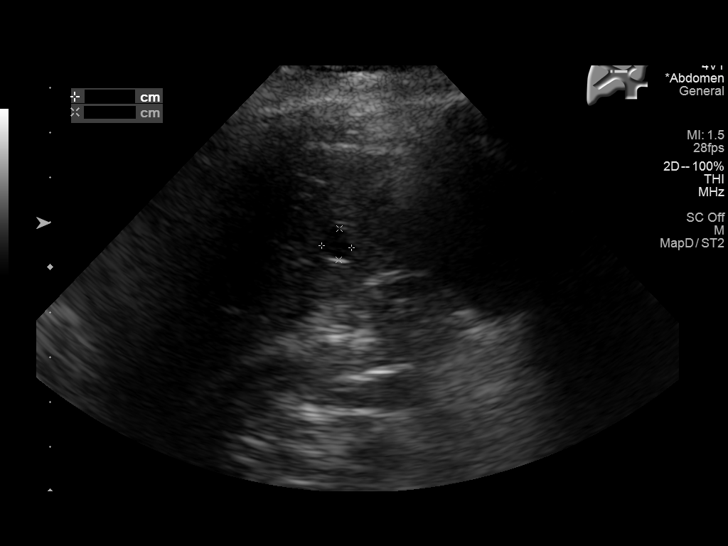
[im 43/94]
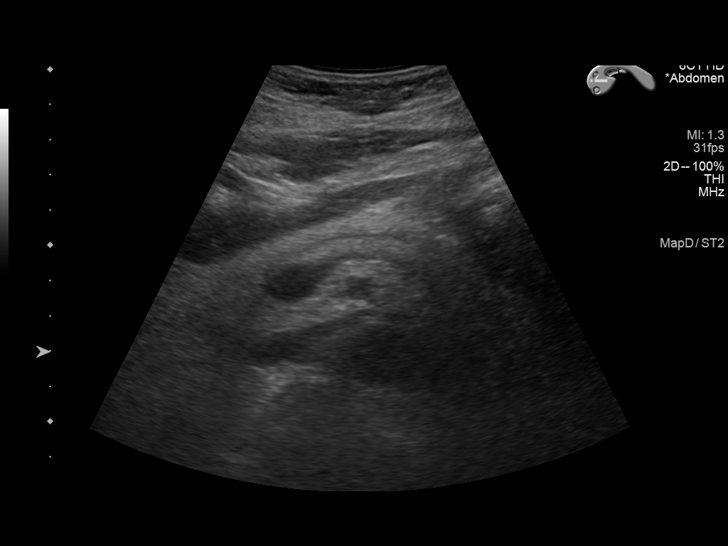
[im 51/94]
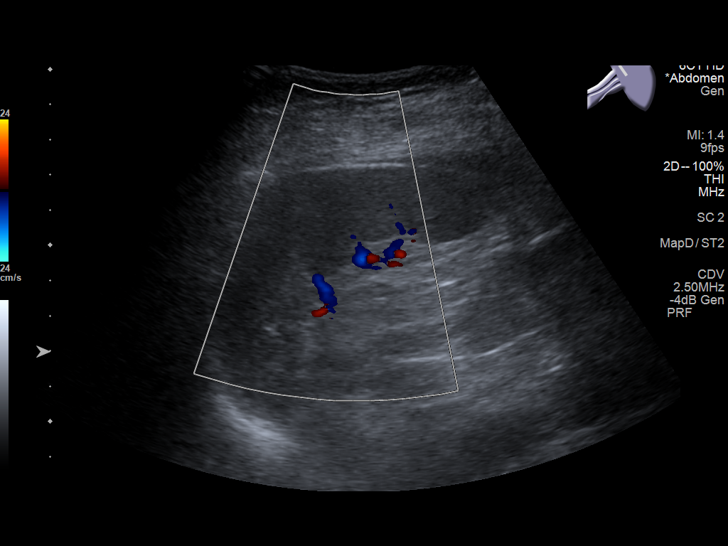
[im 59/94]
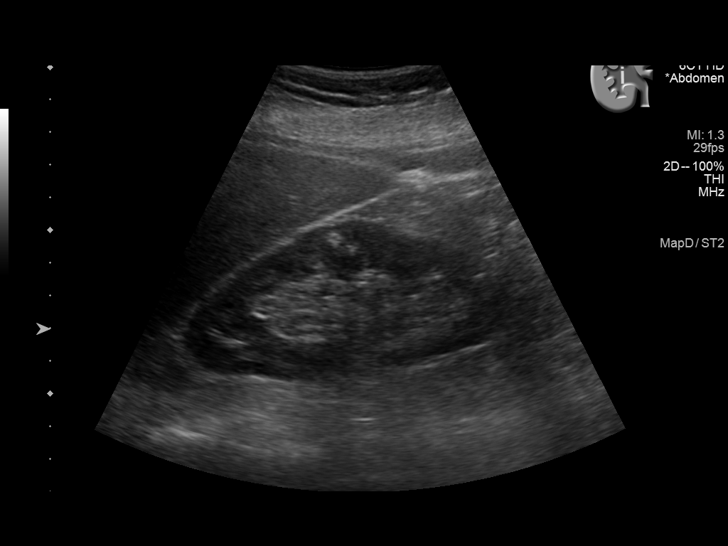
[im 63/94]
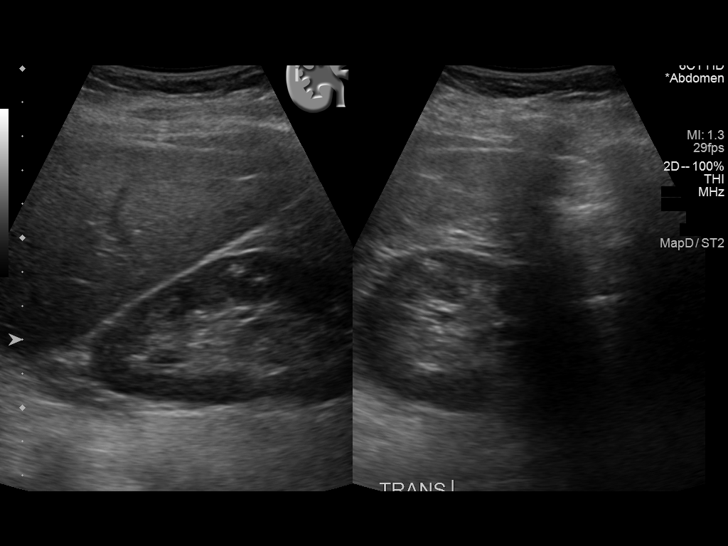
[im 70/94]
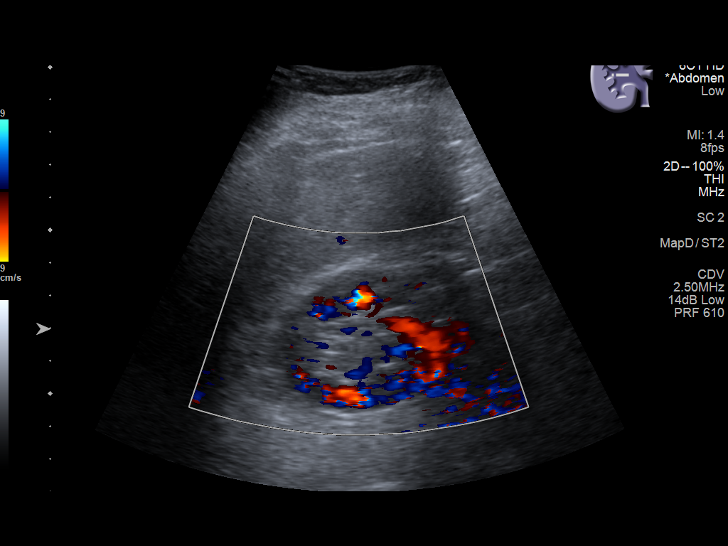
[im 78/94]
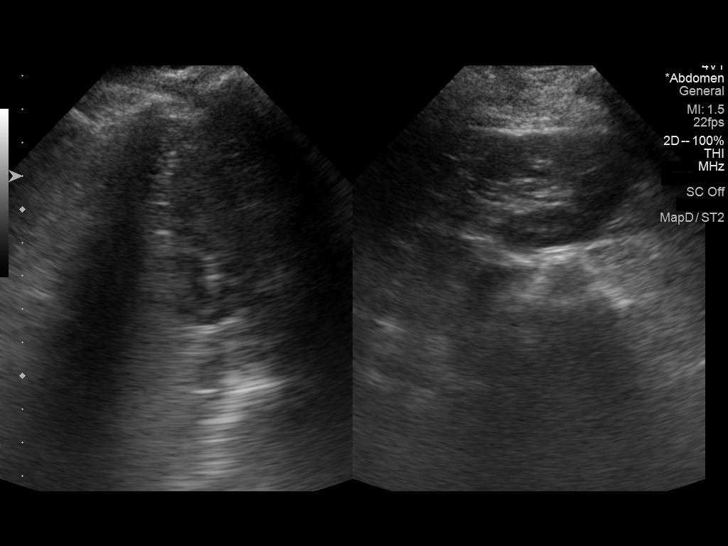
[im 86/94]
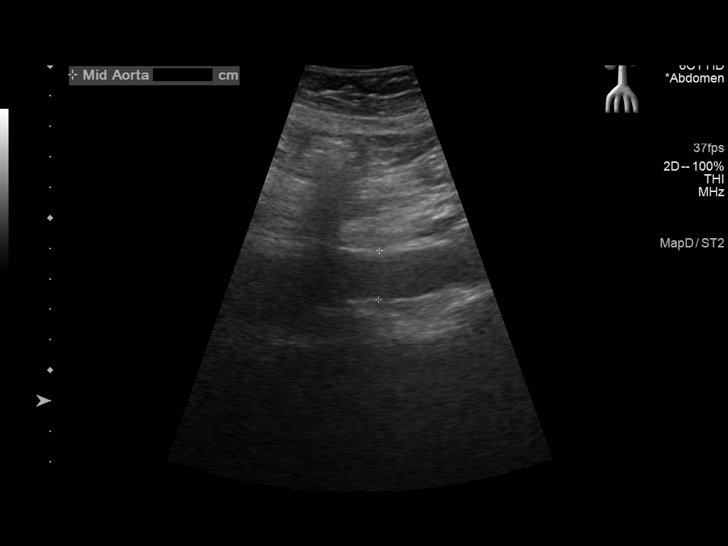
[im 94/94]
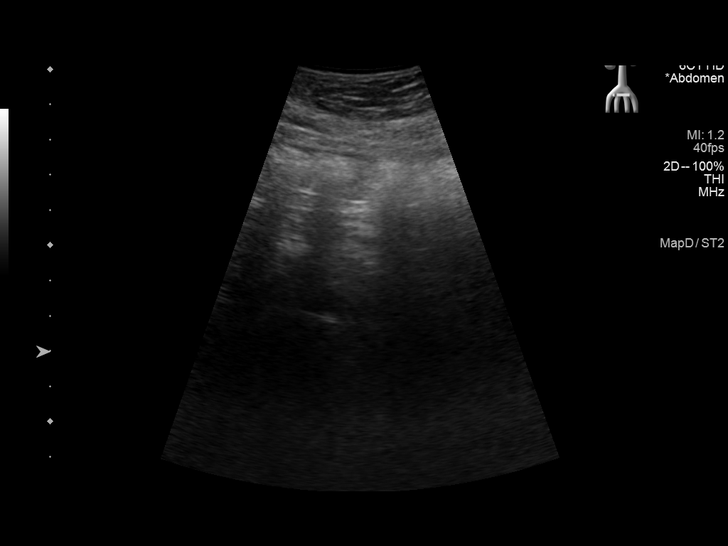

[14 of 25 positions shown; findings below may reference images not displayed]

FINDINGS: Gallbladder: No gallstones or wall thickening visualized. No
sonographic Murphy sign noted by sonographer.

Common bile duct: Diameter: 3 mm

Liver: 8 mm cyst of the right lobe of the liver. Within normal
limits in parenchymal echogenicity. Portal vein is patent on color
Doppler imaging with normal direction of blood flow towards the
liver.

IVC: No abnormality visualized.

Pancreas: Visualized portion unremarkable.

Spleen: Size and appearance within normal limits.

Right Kidney: Length: 10.4 cm. Echogenicity within normal limits. 6
mm hyperechoic lesion of the superior pole of the right kidney,
likely a small incidental AML. No hydronephrosis visualized.

Left Kidney: Length: 11.8 cm. Echogenicity within normal limits. No
mass or hydronephrosis visualized.

Abdominal aorta: No aneurysm visualized.  Atherosclerosis.

Other findings: None.
IMPRESSION: 1. Benign 8 mm cyst of the right lobe of the liver. No further
follow-up or characterization is required.

2.  Aortic Atherosclerosis ([id number]).

## 2020-11-10 ENCOUNTER — Other Ambulatory Visit: Payer: Self-pay | Admitting: Family Medicine

## 2020-11-10 DIAGNOSIS — M810 Age-related osteoporosis without current pathological fracture: Secondary | ICD-10-CM

## 2020-12-24 ENCOUNTER — Other Ambulatory Visit: Payer: Self-pay | Admitting: Unknown Physician Specialty

## 2020-12-25 ENCOUNTER — Other Ambulatory Visit: Payer: Self-pay | Admitting: Unknown Physician Specialty

## 2020-12-25 DIAGNOSIS — R519 Headache, unspecified: Secondary | ICD-10-CM

## 2021-01-01 ENCOUNTER — Ambulatory Visit
Admission: RE | Admit: 2021-01-01 | Discharge: 2021-01-01 | Disposition: A | Discharge status: Home / Self Care | Payer: Medicare PPO | Source: Ambulatory Visit | Attending: Unknown Physician Specialty | Admitting: Unknown Physician Specialty

## 2021-01-01 ENCOUNTER — Other Ambulatory Visit: Payer: Self-pay

## 2021-01-01 DIAGNOSIS — R519 Headache, unspecified: Secondary | ICD-10-CM | POA: Diagnosis present

## 2021-01-01 MED ORDER — GADOBUTROL 1 MMOL/ML IV SOLN
6.0000 mL | Freq: Once | INTRAVENOUS | Status: AC | PRN
  Administered 2021-01-01: 6 mL via INTRAVENOUS

## 2021-01-02 ENCOUNTER — Other Ambulatory Visit: Payer: Self-pay | Admitting: Nurse Practitioner

## 2021-01-13 ENCOUNTER — Other Ambulatory Visit: Payer: Self-pay

## 2021-01-13 NOTE — Telephone Encounter (Signed)
Lvm to make this apt. 

## 2021-01-13 NOTE — Telephone Encounter (Signed)
Will need visit, has not been seen since May 2021.  Refusing.

## 2021-01-13 NOTE — Telephone Encounter (Signed)
Please get patient scheduled.  °

## 2021-01-13 NOTE — Telephone Encounter (Signed)
Routing to provider  

## 2021-01-16 ENCOUNTER — Other Ambulatory Visit: Payer: Self-pay

## 2021-01-16 ENCOUNTER — Encounter: Payer: Self-pay | Admitting: Nurse Practitioner

## 2021-01-16 ENCOUNTER — Ambulatory Visit: Payer: Medicare PPO | Admitting: Nurse Practitioner

## 2021-01-16 VITALS — BP 144/86 | HR 82 | Temp 98.6°F | Wt 152.2 lb

## 2021-01-16 DIAGNOSIS — F419 Anxiety disorder, unspecified: Secondary | ICD-10-CM

## 2021-01-16 DIAGNOSIS — R519 Headache, unspecified: Secondary | ICD-10-CM

## 2021-01-16 DIAGNOSIS — G8929 Other chronic pain: Secondary | ICD-10-CM

## 2021-01-16 DIAGNOSIS — I10 Essential (primary) hypertension: Secondary | ICD-10-CM

## 2021-01-16 DIAGNOSIS — E039 Hypothyroidism, unspecified: Secondary | ICD-10-CM | POA: Diagnosis not present

## 2021-01-16 MED ORDER — ALPRAZOLAM 0.25 MG PO TABS: 0.2500 mg | ORAL_TABLET | Freq: Every evening | ORAL | 0 refills | Status: DC | PRN

## 2021-01-16 NOTE — Progress Notes (Addendum)
BP (!) 144/86 (BP Location: Left Arm, Cuff Size: Small)   Pulse 82   Temp 98.6 F (37 C) (Oral)   Wt 152 lb 3.2 oz (69 kg)   LMP  (LMP Unknown)   SpO2 97%   BMI 24.57 kg/m    Subjective:    Patient ID: Savannah [name], female    DOB: 05/09/1946, 75 y.o.   MRN: [id number]  HPI: Savannah [name] is a 75 y.o. female  Chief Complaint  Patient presents with  . Headache    Pt states she has been having headaches for the past 6 weeks. States she is very sensitive to light during this time. Went to ENT a few weeks ago to make sure she wasn't having a sinus headache. Had an MRI done, states it was normal. Advised to see Dr. Brigitte Pulse with [facility], has an appointment with him on March 2nd.    HEADACHES Patient had sinus surgery in 2018.  Patient has already seen Dr. Tami Ribas, ENT, who did an MRI which was normal and ruled out sinus issues.  ENT wanted patient to see Neurology which she has scheduled for March.  Patient states that she has ben on Lovastatin for almost 1 year but has not tolerated other statins in the past.  It took her several months-years of taking each statin before she showed side effects. Duration: 6 weeks. Onset: sudden Severity: 5/10 Quality: Patient feels "woozy" like she has had too much to drink.  Frequency: Daily but not constant. Location: pain location varies: eyes, temples, and back of head are the most common places. Headache duration: Lasts until she does something about them. Radiation: no Time of day headache occurs: Usually starts when patient gets up and starts her day. Patient goes to bed with it at times. Alleviating factors: Xanax, cold packs, rest Aggravating factors: nothing aggravates the pain but it is constant when it comes on.  Headache status at time of visit: current headache Treatments attempted: Treatments attempted: Tylenol, rest and ice   Aura: yes Nausea:  no Vomiting: no Photophobia:  yes Phonophobia:  yes Effect on social functioning:   yes Numbers of missed days of school/work each month: NA Confusion:  no Gait disturbance/ataxia:  no Behavioral changes:  no Fevers:  no Relevant past medical, surgical, family and social history reviewed and updated as indicated. Interim medical history since our last visit reviewed. Allergies and medications reviewed and updated. Patient has tried to eliminate caffeine, sugar, alcohol and artifical sweeteners.    Patient has an upcoming appointment with an eye doctor.   Review of Systems  Respiratory: Negative for shortness of breath.   Cardiovascular: Negative for chest pain and leg swelling.  Neurological: Positive for light-headedness, numbness and headaches.  Psychiatric/Behavioral: The patient is nervous/anxious.     Per HPI unless specifically indicated above     Objective:    BP (!) 144/86 (BP Location: Left Arm, Cuff Size: Small)   Pulse 82   Temp 98.6 F (37 C) (Oral)   Wt 152 lb 3.2 oz (69 kg)   LMP  (LMP Unknown)   SpO2 97%   BMI 24.57 kg/m   Wt Readings from Last 3 Encounters:  01/16/21 152 lb 3.2 oz (69 kg)  08/27/20 147 lb 0.8 oz (66.7 kg)  06/19/20 147 lb (66.7 kg)    Physical Exam Vitals and nursing note reviewed.  Constitutional:      General: She is awake. She is not in acute distress.  Appearance: She is well-developed. She is not ill-appearing.  HENT:     Head: Normocephalic and atraumatic.     Right Ear: Hearing, tympanic membrane, ear canal and external ear normal. No drainage.     Left Ear: Hearing, tympanic membrane, ear canal and external ear normal. No drainage.     Nose: Nose normal.     Right Sinus: No maxillary sinus tenderness or frontal sinus tenderness.     Left Sinus: No maxillary sinus tenderness or frontal sinus tenderness.     Mouth/Throat:     Mouth: Mucous membranes are moist.     Pharynx: Oropharynx is clear. Uvula midline. No pharyngeal swelling, oropharyngeal exudate or posterior oropharyngeal erythema.  Eyes:      General: Lids are normal.        Right eye: No discharge.        Left eye: No discharge.     Extraocular Movements: Extraocular movements intact.     Conjunctiva/sclera: Conjunctivae normal.     Pupils: Pupils are equal, round, and reactive to light.     Visual Fields: Right eye visual fields normal and left eye visual fields normal.  Neck:     Thyroid: No thyromegaly.     Vascular: No carotid bruit.     Trachea: Trachea normal.  Cardiovascular:     Rate and Rhythm: Normal rate and regular rhythm.     Heart sounds: Normal heart sounds. No murmur heard. No gallop.   Pulmonary:     Effort: Pulmonary effort is normal. No accessory muscle usage or respiratory distress.     Breath sounds: Normal breath sounds.  Chest:  Breasts:     Right: Normal. No axillary adenopathy or supraclavicular adenopathy.     Left: Normal. No axillary adenopathy or supraclavicular adenopathy.    Abdominal:     General: Bowel sounds are normal.     Palpations: Abdomen is soft. There is no hepatomegaly or splenomegaly.     Tenderness: There is no abdominal tenderness.  Musculoskeletal:        General: Normal range of motion.     Cervical back: Normal range of motion and neck supple.     Right lower leg: No edema.     Left lower leg: No edema.  Lymphadenopathy:     Head:     Right side of head: No submental, submandibular, tonsillar, preauricular or posterior auricular adenopathy.     Left side of head: No submental, submandibular, tonsillar, preauricular or posterior auricular adenopathy.     Cervical: No cervical adenopathy.     Upper Body:     Right upper body: No supraclavicular, axillary or pectoral adenopathy.     Left upper body: No supraclavicular, axillary or pectoral adenopathy.  Skin:    General: Skin is warm and dry.     Capillary Refill: Capillary refill takes less than 2 seconds.     Findings: No rash.  Neurological:     Mental Status: She is alert and oriented to person, place, and  time.     Cranial Nerves: Cranial nerves are intact.     Sensory: Sensation is intact.     Motor: No weakness or atrophy.     Coordination: Coordination is intact.     Gait: Gait is intact.     Deep Tendon Reflexes: Reflexes are normal and symmetric. Reflexes normal.     Reflex Scores:      Brachioradialis reflexes are 2+ on the right side and 2+ on the  left side.      Patellar reflexes are 2+ on the right side and 2+ on the left side. Psychiatric:        Attention and Perception: Attention normal.        Mood and Affect: Mood normal.        Speech: Speech normal.        Behavior: Behavior normal. Behavior is cooperative.        Thought Content: Thought content normal.        Judgment: Judgment normal.    Side effects and adverse reactions of taking Benzodiazepines discussed with patient during visit prior to refill.  Results for orders placed or performed in visit on 01/16/21  Comp Met (CMET)  Result Value Ref Range   Glucose 92 65 - 99 mg/dL   BUN 11 8 - 27 mg/dL   Creatinine, Ser 0.91 0.57 - 1.00 mg/dL   GFR calc non Af Amer 62 >59 mL/min/1.73   GFR calc Af Amer 71 >59 mL/min/1.73   BUN/Creatinine Ratio 12 12 - 28   Sodium 142 134 - 144 mmol/L   Potassium 3.9 3.5 - 5.2 mmol/L   Chloride 100 96 - 106 mmol/L   CO2 28 20 - 29 mmol/L   Calcium 9.5 8.7 - 10.3 mg/dL   Total Protein 6.7 6.0 - 8.5 g/dL   Albumin 4.6 3.7 - 4.7 g/dL   Globulin, Total 2.1 1.5 - 4.5 g/dL   Albumin/Globulin Ratio 2.2 1.2 - 2.2   Bilirubin Total 0.2 0.0 - 1.2 mg/dL   Alkaline Phosphatase 59 44 - 121 IU/L   AST 18 0 - 40 IU/L   ALT 15 0 - 32 IU/L  TSH  Result Value Ref Range   TSH 1.290 0.450 - 4.500 uIU/mL  T4, free  Result Value Ref Range   Free T4 1.55 0.82 - 1.77 ng/dL  CBC w/Diff  Result Value Ref Range   WBC 5.3 3.4 - 10.8 x10E3/uL   RBC 4.69 3.77 - 5.28 x10E6/uL   Hemoglobin 14.8 11.1 - 15.9 g/dL   Hematocrit 44.1 34.0 - 46.6 %   MCV 94 79 - 97 fL   MCH 31.6 26.6 - 33.0 pg   MCHC  33.6 31.5 - 35.7 g/dL   RDW 12.2 11.7 - 15.4 %   Platelets 211 150 - 450 x10E3/uL   Neutrophils 63 Not Estab. %   Lymphs 25 Not Estab. %   Monocytes 9 Not Estab. %   Eos 2 Not Estab. %   Basos 1 Not Estab. %   Neutrophils Absolute 3.3 1.4 - 7.0 x10E3/uL   Lymphocytes Absolute 1.3 0.7 - 3.1 x10E3/uL   Monocytes Absolute 0.5 0.1 - 0.9 x10E3/uL   EOS (ABSOLUTE) 0.1 0.0 - 0.4 x10E3/uL   Basophils Absolute 0.0 0.0 - 0.2 x10E3/uL   Immature Granulocytes 0 Not Estab. %   Immature Grans (Abs) 0.0 0.0 - 0.1 x10E3/uL      Assessment & Plan:   Problem List Items Addressed This Visit      Cardiovascular and Mediastinum   Essential hypertension    Stable.  Patient checks BP at home and is <140/90.  Continue with current medication regimen.      Relevant Medications   amLODipine (NORVASC) 5 MG tablet     Endocrine   Hypothyroidism    Chronic.  Labs checked today.  Continue with current medication regimen.      Relevant Orders   TSH (Completed)   T4, free (Completed)  Other   Chronic anxiety    Ongoing. Patient has been more anxious since the headaches have started. Patient has been using her Xanax to help relax her. PMP checked and refill given today.      Relevant Medications   ALPRAZolam (XANAX) 0.25 MG tablet    Other Visit Diagnoses    Chronic intractable headache, unspecified headache type    -  Primary   Ongoing. Stop Statin x2 weeks. Use tylenol PRN. Continue with cold packs.  Labs drawn today. If symptoms do not improve will cont statin and begin Topamax.   Relevant Medications   amLODipine (NORVASC) 5 MG tablet   Other Relevant Orders   Comp Met (CMET) (Completed)   CBC w/Diff (Completed)       Follow up plan: Return in about 2 weeks (around 01/30/2021) for Headaches.

## 2021-01-17 LAB — CBC WITH DIFFERENTIAL/PLATELET
Basophils Absolute: 0 10*3/uL (ref 0.0–0.2)
Basos: 1 %
EOS (ABSOLUTE): 0.1 10*3/uL (ref 0.0–0.4)
Eos: 2 %
Hematocrit: 44.1 % (ref 34.0–46.6)
Hemoglobin: 14.8 g/dL (ref 11.1–15.9)
Immature Grans (Abs): 0 10*3/uL (ref 0.0–0.1)
Immature Granulocytes: 0 %
Lymphocytes Absolute: 1.3 10*3/uL (ref 0.7–3.1)
Lymphs: 25 %
MCH: 31.6 pg (ref 26.6–33.0)
MCHC: 33.6 g/dL (ref 31.5–35.7)
MCV: 94 fL (ref 79–97)
Monocytes Absolute: 0.5 10*3/uL (ref 0.1–0.9)
Monocytes: 9 %
Neutrophils Absolute: 3.3 10*3/uL (ref 1.4–7.0)
Neutrophils: 63 %
Platelets: 211 10*3/uL (ref 150–450)
RBC: 4.69 x10E6/uL (ref 3.77–5.28)
RDW: 12.2 % (ref 11.7–15.4)
WBC: 5.3 10*3/uL (ref 3.4–10.8)

## 2021-01-17 LAB — COMPREHENSIVE METABOLIC PANEL
ALT: 15 IU/L (ref 0–32)
AST: 18 IU/L (ref 0–40)
Albumin/Globulin Ratio: 2.2 (ref 1.2–2.2)
Albumin: 4.6 g/dL (ref 3.7–4.7)
Alkaline Phosphatase: 59 IU/L (ref 44–121)
BUN/Creatinine Ratio: 12 (ref 12–28)
BUN: 11 mg/dL (ref 8–27)
Bilirubin Total: 0.2 mg/dL (ref 0.0–1.2)
CO2: 28 mmol/L (ref 20–29)
Calcium: 9.5 mg/dL (ref 8.7–10.3)
Chloride: 100 mmol/L (ref 96–106)
Creatinine, Ser: 0.91 mg/dL (ref 0.57–1.00)
GFR calc Af Amer: 71 mL/min/{1.73_m2} (ref >59)
GFR calc non Af Amer: 62 mL/min/{1.73_m2} (ref >59)
Globulin, Total: 2.1 g/dL (ref 1.5–4.5)
Glucose: 92 mg/dL (ref 65–99)
Potassium: 3.9 mmol/L (ref 3.5–5.2)
Sodium: 142 mmol/L (ref 134–144)
Total Protein: 6.7 g/dL (ref 6.0–8.5)

## 2021-01-17 LAB — T4, FREE: Free T4: 1.55 ng/dL (ref 0.82–1.77)

## 2021-01-17 LAB — TSH: TSH: 1.29 u[IU]/mL (ref 0.450–4.500)

## 2021-01-19 NOTE — Progress Notes (Signed)
Please let patient know that her lab work looks great.  There is not explanation for her headaches based on the lab work.  Continue with plan as discussed during visit.  We will reevaluate in 2 weeks.  It was a pleasure meeting you and I will see you soon.

## 2021-01-20 ENCOUNTER — Ambulatory Visit: Payer: Medicare PPO | Admitting: Nurse Practitioner

## 2021-01-22 NOTE — Assessment & Plan Note (Signed)
Ongoing. Patient has been more anxious since the headaches have started. Patient has been using her Xanax to help relax her. PMP checked and refill given today.

## 2021-01-22 NOTE — Assessment & Plan Note (Signed)
Stable.  Patient checks BP at home and is <140/90.  Continue with current medication regimen.

## 2021-01-22 NOTE — Assessment & Plan Note (Signed)
Chronic.  Labs checked today.  Continue with current medication regimen.

## 2021-01-28 ENCOUNTER — Ambulatory Visit: Payer: Self-pay | Admitting: Urology

## 2021-01-30 ENCOUNTER — Ambulatory Visit: Payer: Medicare PPO | Admitting: Nurse Practitioner

## 2021-02-20 ENCOUNTER — Other Ambulatory Visit: Payer: Self-pay

## 2021-02-20 ENCOUNTER — Ambulatory Visit
Admission: RE | Admit: 2021-02-20 | Discharge: 2021-02-20 | Disposition: A | Discharge status: Home / Self Care | Payer: Medicare PPO | Source: Ambulatory Visit | Attending: Urology | Admitting: Urology

## 2021-02-20 DIAGNOSIS — D1771 Benign lipomatous neoplasm of kidney: Secondary | ICD-10-CM | POA: Insufficient documentation

## 2021-02-20 DIAGNOSIS — D179 Benign lipomatous neoplasm, unspecified: Secondary | ICD-10-CM

## 2021-02-24 ENCOUNTER — Telehealth: Payer: Self-pay

## 2021-02-24 NOTE — Telephone Encounter (Signed)
-----   Message from Billey Co, MD sent at 02/24/2021  8:33 AM EST ----- Very small kidney cyst is stable, ok to follow up as needed  Nickolas Madrid, MD 02/24/2021

## 2021-02-24 NOTE — Telephone Encounter (Signed)
Called pt informed her of the information below. Pt gave verbal understanding.  

## 2021-03-14 NOTE — Addendum Note (Signed)
Encounter addended by: Billey Co, MD on: 03/14/2021 9:08 AM  Actions taken: Clinical Note Signed

## 2021-03-14 NOTE — Progress Notes (Signed)
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   Nickolas Madrid, MD 03/14/2021

## 2021-03-16 ENCOUNTER — Telehealth (INDEPENDENT_AMBULATORY_CARE_PROVIDER_SITE_OTHER): Payer: Medicare PPO | Admitting: Nurse Practitioner

## 2021-03-16 ENCOUNTER — Encounter: Payer: Self-pay | Admitting: Nurse Practitioner

## 2021-03-16 VITALS — BP 162/85 | Temp 98.6°F | Wt 150.0 lb

## 2021-03-16 DIAGNOSIS — J069 Acute upper respiratory infection, unspecified: Secondary | ICD-10-CM

## 2021-03-16 MED ORDER — AZITHROMYCIN 250 MG PO TABS: ORAL_TABLET | ORAL | 0 refills | Status: DC

## 2021-03-16 MED ORDER — PREDNISONE 10 MG PO TABS: ORAL_TABLET | ORAL | 0 refills | Status: DC

## 2021-03-16 NOTE — Addendum Note (Signed)
Addended by: Georgina Peer on: 03/16/2021 11:06 AM   Modules accepted: Orders

## 2021-03-16 NOTE — Assessment & Plan Note (Signed)
Acute, symptoms started 03/11/21. Had recent trip to [place] 2 weeks ago. Check Covid-19 test. Sinus congestion PE medicine OTC may be increasing her blood pressure. Encouraged fluids, rest, tylenol as needed for pain, gargle with warm salt water, nasal saline spray. Will send in prescription for prednisone and z-pak. Follow-up if symptoms don't improve or worsen.

## 2021-03-16 NOTE — Progress Notes (Signed)
Acute Office Visit  Subjective:    Patient ID: Savannah [name], female    DOB: 10-30-1946, 75 y.o.   MRN: [id number]  Chief Complaint  Patient presents with  . Sinusitis    Patient states Wednesday of last week she became symptomatic. Patient states it started with a sore throat and then congestion that is draining down her throat, and she is coughing up green mucus. Patient state she is starting to lose her voice and talk hurts her throat. Patient states she has tried over the counter medications since Wednesday and nothing seems to help. Patient denies taking any at home covid test.   . Sore Throat  . Nasal Congestion  . Cough    HPI Patient is in today for sore throat that started last Wednesday. Still has a sore throat, but is now experiencing cough, congestion, and post nasal drip that is worse at night. She recently traveled to [place] 2 weeks ago. Her symptoms are not improving and she needs some prescription medications. She was given prednisone and a z-pack in the past for similar symptoms that turned into pneumonia.   UPPER RESPIRATORY TRACT INFECTION Worst symptom: nasal congestion Fever: no Cough: yes Shortness of breath: no Wheezing: no Chest pain: yes, with cough Chest tightness: yes Chest congestion: no Nasal congestion: yes Runny nose: yes Post nasal drip: yes Sneezing: no Sore throat: yes Swollen glands: no Sinus pressure: no Headache: no Face pain: no Toothache: no Ear pain: no  Ear pressure: no  Eyes red/itching:no Eye drainage/crusting: no  Vomiting: no Rash: no Fatigue: no Sick contacts: no Strep contacts: no  Context: worse Recurrent sinusitis: no Relief with OTC cold/cough medications: no  Treatments attempted: cold/sinus and pseudoephedrine, tylenol  Past Medical History:  Diagnosis Date  . Anxiety   . Cancer (HCC)    skin- squamous  . HOH (hard of hearing)    WEARS AIDS  . Hyperlipidemia   . Hypertension    CONTROLLED ON MEDS  .  Hypothyroidism   . IBS (irritable bowel syndrome)   . MVP (mitral valve prolapse)   . OA (osteoarthritis) of neck    JOINTS  . Osteoporosis   . Thyroid disease     Past Surgical History:  Procedure Laterality Date  . ANKLE FRACTURE SURGERY Left   . BREAST CYST ASPIRATION Right 90s  . COLONOSCOPY WITH PROPOFOL N/A 04/12/2016   Procedure: COLONOSCOPY WITH PROPOFOL;  Surgeon: Manya Silvas, MD;  Location: [facility] ENDOSCOPY;  Service: Endoscopy;  Laterality: N/A;  . FRACTURE SURGERY     ANKLE  . HAMMER TOE SURGERY Right 02/08/2018   Procedure: HAMMER TOE CORRECTION-5TH TOE, Excision soft tissue mass dorsal lateral right fifth toe and exostectomy dorsal lateral DIPJ fifth toe;  Surgeon: Samara Deist, DPM;  Location: [place];  Service: Podiatry;  Laterality: Right;  IVA LOCAL  . moses surgery  09/2015  . SINUS SURGERY WITH INSTATRAK  10/2017   DEVIATED SEPTUM  . 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 Hypertension Mother   . Colon cancer Mother   . Lung cancer Father   . Brain cancer Father   . Hyperlipidemia Brother   . Breast cancer Neg Hx     Social History   Socioeconomic History  . Marital status: Married    Spouse name: Not on file  . Number of children: Not on file  . Years of education:  Not on file  . Highest education level: Master's degree (e.g., MA, MS, MEng, MEd, MSW, MBA)  Occupational History  . Occupation: retired  Tobacco Use  . Smoking status: Former Smoker    Packs/day: 1.00    Years: 4.00    Pack years: 4.00    Types: Cigarettes    Quit date: 1970    Years since quitting: 52.2  . Smokeless tobacco: Never Used  Vaping Use  . Vaping Use: Never used  Substance and Sexual Activity  . Alcohol use: Yes    Alcohol/week: 10.0 standard drinks    Types: 10 Glasses of wine per week  . Drug use: No  . Sexual activity: Yes    Birth control/protection: None    Comment: Married   Other Topics Concern  . Not on file  Social History Narrative  . Not on file   Social Determinants of Health   Financial Resource Strain: Low Risk   . Difficulty of Paying Living Expenses: Not hard at all  Food Insecurity: No Food Insecurity  . Worried About Charity fundraiser in the Last Year: Never true  . Ran Out of Food in the Last Year: Never true  Transportation Needs: No Transportation Needs  . Lack of Transportation (Medical): No  . Lack of Transportation (Non-Medical): No  Physical Activity: Insufficiently Active  . Days of Exercise per Week: 3 days  . Minutes of Exercise per Session: 30 min  Stress: Not on file  Social Connections: Moderately Isolated  . Frequency of Communication with Friends and Family: More than three times a week  . Frequency of Social Gatherings with Friends and Family: More than three times a week  . Attends Religious Services: Never  . Active Member of Clubs or Organizations: No  . Attends Archivist Meetings: Never  . Marital Status: Married  Human resources officer Violence: Not on file    Outpatient Medications Prior to Visit  Medication Sig Dispense Refill  . ALPRAZolam (XANAX) 0.25 MG tablet Take 1 tablet (0.25 mg total) by mouth at bedtime as needed for anxiety. 30 tablet 0  . clobetasol ointment (TEMOVATE) 1.66 % Apply 1 application topically 2 (two) times daily.    . cyanocobalamin (,VITAMIN B-12,) 1000 MCG/ML injection Inject int muscle once a week for 3 weeks then once a month for 4 months    . ibandronate (BONIVA) 150 MG tablet Take one tab. every 30 days,Take in the am with a 8oz glass of water,an empty stomach,nothing by mouth for 30 min, dont lie down for 30 min 3 tablet 0  . levothyroxine (SYNTHROID) 75 MCG tablet Take 1 tablet (75 mcg total) by mouth daily before breakfast. 90 tablet 0  . metoprolol succinate (TOPROL-XL) 50 MG 24 hr tablet Take with or immediately following a meal.Take 1/2 tablet (25 mg) daily with or  immediately following a meal. 45 tablet 1  . scopolamine (TRANSDERM-SCOP, 1.5 MG,) 1 MG/3DAYS Place 1 patch (1.5 mg total) onto the skin every 3 (three) days. 2 patch 0  . amLODipine (NORVASC) 5 MG tablet Take 5 mg by mouth daily. (Patient not taking: Reported on 03/16/2021)    . baclofen (LIORESAL) 10 MG tablet Take by mouth. (Patient not taking: Reported on 03/16/2021)    . lovastatin (MEVACOR) 20 MG tablet Take 1 tab daily (Patient not taking: Reported on 03/16/2021) 90 tablet 1  . tiZANidine (ZANAFLEX) 4 MG tablet Take by mouth. (Patient not taking: Reported on 03/16/2021)     No  facility-administered medications prior to visit.    Allergies  Allergen Reactions  . Amoxil [Amoxicillin] Itching  . Penicillins Other (See Comments)    "passed out"    Review of Systems  Constitutional: Negative for fatigue and fever.  HENT: Positive for congestion, postnasal drip, rhinorrhea and sore throat. Negative for ear pain, sinus pressure, sinus pain and sneezing.   Eyes: Negative.   Respiratory: Positive for cough. Negative for shortness of breath.   Cardiovascular: Negative.   Gastrointestinal: Negative.   Genitourinary: Negative.   Neurological: Negative for dizziness and light-headedness.       Objective:    Physical Exam Vitals and nursing note reviewed.  Pulmonary:     Comments: Able to talk in complete sentences on the phone Neurological:     Mental Status: She is alert and oriented to person, place, and time.     BP (!) 162/85   Temp 98.6 F (37 C) (Oral)   Wt 150 lb (68 kg)   LMP  (LMP Unknown)   BMI 24.21 kg/m  Wt Readings from Last 3 Encounters:  03/16/21 150 lb (68 kg)  01/16/21 152 lb 3.2 oz (69 kg)  08/27/20 147 lb 0.8 oz (66.7 kg)    Lab Results  Component Value Date   TSH 1.290 01/16/2021   Lab Results  Component Value Date   WBC 5.3 01/16/2021   HGB 14.8 01/16/2021   HCT 44.1 01/16/2021   MCV 94 01/16/2021   PLT 211 01/16/2021   Lab Results   Component Value Date   NA 142 01/16/2021   K 3.9 01/16/2021   CO2 28 01/16/2021   GLUCOSE 92 01/16/2021   BUN 11 01/16/2021   CREATININE 0.91 01/16/2021   BILITOT 0.2 01/16/2021   ALKPHOS 59 01/16/2021   AST 18 01/16/2021   ALT 15 01/16/2021   PROT 6.7 01/16/2021   ALBUMIN 4.6 01/16/2021   CALCIUM 9.5 01/16/2021   ANIONGAP 9 08/27/2020   Lab Results  Component Value Date   CHOL 224 (H) 05/12/2020   Lab Results  Component Value Date   HDL 78 05/12/2020   Lab Results  Component Value Date   LDLCALC 121 (H) 05/12/2020   Lab Results  Component Value Date   TRIG 143 05/12/2020      Assessment & Plan:   Problem List Items Addressed This Visit      Respiratory   Upper respiratory tract infection - Primary    Acute, symptoms started 03/11/21. Had recent trip to [place] 2 weeks ago. Check Covid-19 test. Sinus congestion PE medicine OTC may be increasing her blood pressure. Encouraged fluids, rest, tylenol as needed for pain, gargle with warm salt water, nasal saline spray. Will send in prescription for prednisone and z-pak. Follow-up if symptoms don't improve or worsen.       Relevant Medications   azithromycin (ZITHROMAX Z-PAK) 250 MG tablet   Other Relevant Orders   Novel Coronavirus, NAA ([facility])       Meds ordered this encounter  Medications  . azithromycin (ZITHROMAX Z-PAK) 250 MG tablet    Sig: Take 2 tablets the first day, then 1 tablet every day until gone    Dispense:  6 each    Refill:  0  . predniSONE (DELTASONE) 10 MG tablet    Sig: Take 6 tablets the first day, 5 tablets the next day, then decrease by 1 tablet every day until gone.    Dispense:  21 tablet    Refill:  0     .  This visit was completed via telephone due to the restrictions of the COVID-19 pandemic. All issues as above were discussed and addressed but no physical exam was performed. If it was felt that the patient should be evaluated in the office, they were directed there. The patient  verbally consented to this visit. Patient was unable to complete an audio/visual visit due to Technical difficulties, Lack of internet. Due to the catastrophic nature of the COVID-19 pandemic, this visit was done through audio contact only. . Location of the patient: home . Location of the provider: work . Those involved with this call:  . Provider: Billy Fischer, DNP . CMA: Destiny Knight . Front Desk/Registration: Jill Side  . Time spent on call: 20 minutes on the phone discussing health concerns. 30 minutes total spent in review of patient's record and preparation of their chart.   Charyl Dancer, NP

## 2021-03-16 NOTE — Patient Instructions (Signed)
It was great to talk to you!  You have an upper respiratory infection. I sent in a prescription for prednisone and a z-pak to your pharmacy. Take the prednisone in the morning, as it can keep you awake if you take it too late. We will test you for Covid-19 today. Here are some other things that help you feel better:   - Increased rest - Increasing Fluids - Acetaminophen / ibuprofen as needed for fever/pain.  - Salt water gargling, chloraseptic spray and throat lozenges  - Mucinex.  - Saline sinus flushes or a neti pot.  - Humidifying the air  Call our office if your symptoms don't improve or worsen after 1 week. Your cough may persist for a few weeks, even your other symptoms have improved.   Take care,  Vance Peper, NP  .

## 2021-03-18 LAB — NOVEL CORONAVIRUS, NAA: SARS-CoV-2, NAA: NOT DETECTED

## 2021-03-18 LAB — SARS-COV-2, NAA 2 DAY TAT

## 2021-05-09 ENCOUNTER — Encounter: Payer: Self-pay | Admitting: Nurse Practitioner

## 2021-05-09 DIAGNOSIS — E538 Deficiency of other specified B group vitamins: Secondary | ICD-10-CM | POA: Insufficient documentation

## 2021-05-09 DIAGNOSIS — E559 Vitamin D deficiency, unspecified: Secondary | ICD-10-CM | POA: Insufficient documentation

## 2021-05-11 ENCOUNTER — Telehealth: Payer: Self-pay

## 2021-05-11 NOTE — Telephone Encounter (Signed)
Copied from [place] [phone]. Topic: Appointment Scheduling - Scheduling Inquiry for Clinic >> May 11, 2021  9:08 AM Oneta Rack wrote: Patient was upset her Medicare Wellness was cancelled on 5/26 and [facility] to 05/15/2021 and she did not receive a phone call. Informed patient her 05/15/2021 Medicare Wellness will be a phone call visit and she was okay with that. Patient would like a follow up call to confirm.

## 2021-05-14 ENCOUNTER — Ambulatory Visit (INDEPENDENT_AMBULATORY_CARE_PROVIDER_SITE_OTHER): Payer: Medicare PPO | Admitting: Nurse Practitioner

## 2021-05-14 ENCOUNTER — Other Ambulatory Visit: Payer: Self-pay

## 2021-05-14 ENCOUNTER — Encounter: Payer: Self-pay | Admitting: Nurse Practitioner

## 2021-05-14 ENCOUNTER — Encounter: Payer: Medicare PPO | Admitting: Family Medicine

## 2021-05-14 ENCOUNTER — Ambulatory Visit: Payer: Medicare PPO

## 2021-05-14 VITALS — BP 120/64 | HR 80 | Temp 98.8°F | Wt 150.6 lb

## 2021-05-14 DIAGNOSIS — E7800 Pure hypercholesterolemia, unspecified: Secondary | ICD-10-CM

## 2021-05-14 DIAGNOSIS — Z0000 Encounter for general adult medical examination without abnormal findings: Secondary | ICD-10-CM | POA: Diagnosis not present

## 2021-05-14 DIAGNOSIS — Z1231 Encounter for screening mammogram for malignant neoplasm of breast: Secondary | ICD-10-CM

## 2021-05-14 DIAGNOSIS — G43509 Persistent migraine aura without cerebral infarction, not intractable, without status migrainosus: Secondary | ICD-10-CM

## 2021-05-14 DIAGNOSIS — E559 Vitamin D deficiency, unspecified: Secondary | ICD-10-CM

## 2021-05-14 DIAGNOSIS — I10 Essential (primary) hypertension: Secondary | ICD-10-CM

## 2021-05-14 DIAGNOSIS — E039 Hypothyroidism, unspecified: Secondary | ICD-10-CM

## 2021-05-14 DIAGNOSIS — I700 Atherosclerosis of aorta: Secondary | ICD-10-CM

## 2021-05-14 DIAGNOSIS — F419 Anxiety disorder, unspecified: Secondary | ICD-10-CM

## 2021-05-14 DIAGNOSIS — Z79899 Other long term (current) drug therapy: Secondary | ICD-10-CM | POA: Insufficient documentation

## 2021-05-14 DIAGNOSIS — E538 Deficiency of other specified B group vitamins: Secondary | ICD-10-CM

## 2021-05-14 DIAGNOSIS — M810 Age-related osteoporosis without current pathological fracture: Secondary | ICD-10-CM

## 2021-05-14 DIAGNOSIS — I493 Ventricular premature depolarization: Secondary | ICD-10-CM

## 2021-05-14 LAB — MICROALBUMIN, URINE WAIVED
Creatinine, Urine Waived: 50 mg/dL (ref 10–300)
Microalb, Ur Waived: 10 mg/L (ref 0–19)
Microalb/Creat Ratio: <30 mg/g (ref <30)

## 2021-05-14 MED ORDER — LEVOTHYROXINE SODIUM 75 MCG PO TABS: 75.0000 ug | ORAL_TABLET | Freq: Every day | ORAL | 4 refills | Status: DC

## 2021-05-14 MED ORDER — ESCITALOPRAM OXALATE 5 MG PO TABS: ORAL_TABLET | ORAL | 4 refills | Status: DC

## 2021-05-14 MED ORDER — ALPRAZOLAM 0.25 MG PO TABS: 0.2500 mg | ORAL_TABLET | Freq: Every evening | ORAL | 0 refills | Status: DC | PRN

## 2021-05-14 MED ORDER — CITALOPRAM HYDROBROMIDE 10 MG PO TABS: 10.0000 mg | ORAL_TABLET | Freq: Every day | ORAL | 3 refills | Status: DC

## 2021-05-14 MED ORDER — IBANDRONATE SODIUM 150 MG PO TABS: ORAL_TABLET | ORAL | 4 refills | Status: DC

## 2021-05-14 MED ORDER — METOPROLOL SUCCINATE ER 50 MG PO TB24: ORAL_TABLET | ORAL | 4 refills | Status: DC

## 2021-05-14 NOTE — Progress Notes (Signed)
BP 120/64 (BP Location: Left Arm)   Pulse 80   Temp 98.8 F (37.1 C) (Oral)   Wt 150 lb 9.6 oz (68.3 kg)   LMP  (LMP Unknown)   SpO2 93%   BMI 24.31 kg/m    Subjective:    Patient ID: Savannah [name], female    DOB: [birth year], 75 y.o.   MRN: [id number]  HPI: Savannah [name] is a 75 y.o. female presenting on 05/14/2021 for comprehensive medical examination. Current medical complaints include:none  She currently lives with:  Menopausal Symptoms: no   HYPERTENSION / HYPERLIPIDEMIA Followed by cardiology and last saw 10/13/20 -- had Holter Monitor last 10/14/20.  Continues on Metoprolol XL 25 MG daily and taking Zetia -- not taking Lovastatin, has tried 3 different statins and all caused headaches. Satisfied with current treatment? yes Duration of hypertension: chronic BP monitoring frequency: daily BP range:  140/77 this morning -- is in a lot of pain today with HA BP medication side effects: no Duration of hyperlipidemia: chronic Cholesterol medication side effects: no Cholesterol supplements: none Medication compliance: good compliance Aspirin: no Recent stressors: no Recurrent headaches: no Visual changes: no Palpitations: no Dyspnea: no Chest pain: no Lower extremity edema: no Dizzy/lightheaded: no   OSTEOPOROSIS Last DEXA 02/12/2020 -- T-score -3.0, continues on Boniva. Satisfied with current treatment?: yes Medication side effects: no Medication compliance: good compliance Past osteoporosis medications/treatments: none Adequate calcium & vitamin D: yes Intolerance to bisphosphonates:no Weight bearing exercises: yes  HYPOTHYROIDISM Continues on Levothyroxine 75 MCG daily.  Last TSH in January 1.290. Thyroid control status:stable Satisfied with current treatment? yes Medication side effects: no Medication compliance: good compliance Etiology of hypothyroidism:  Recent dose adjustment:no Fatigue: no Cold intolerance: no Heat intolerance: no Weight gain: no Weight  loss: no Constipation: no Diarrhea/loose stools: no Palpitations: no Lower extremity edema: no Anxiety/depressed mood: no   MIGRAINES Reports having severe migraines, has had since teenage years.  In later life returned as numbness and vision changes.  Sees Dr. Manuella Ghazi at neurology -- has followed him for years -- last visit was 02/18/2021 -- he prescribed Baclofen/Tizanidine (make her tired) and Xanax (this is for her general anxiety -- she reports this is only thing that works). Has blurred vision issues and spirals with migraines.  Being treated for B12 deficiency with injections at neurology -- last level 02/18/21 was 173.  MRI brain last in January 2022 with no acute findings. Duration: chronic Onset: gradual Severity: 8/10 today Quality: dull, aching and throbbing Frequency: intermittent Location: left side Headache duration: 30 minute to all day Radiation: no Time of day headache occurs: varies Alleviating factors: Xanax or Baclofen Aggravating factors: unknown Headache status at time of visit: current headache Treatments attempted: Baclofen and Xanax  & Tizanidine Aura: no Nausea:  yes Vomiting: no Photophobia:  yes Phonophobia:  yes Effect on social functioning:  yes Numbers of missed days of school/work each month: 1 Confusion:  no Gait disturbance/ataxia:  no Behavioral changes:  no Fevers:  no  ANXIETY/STRESS Taking Xanax 0.25 MG as needed daily for anxiety -- last fill 01/16/21 on PDMP review.  Would like to start on SSRI.  Pt is aware of risks of benzo medication use to include increased sedation, respiratory suppression, falls, dependence and cardiovascular events.  Pt would like to continue treatment as benefit determined to outweigh risk -- she has been on Xanax since her 30's. Duration:stable Anxious mood: yes  Excessive worrying: yes Irritability: no  Sweating: no Nausea: no  Palpitations:no Hyperventilation: no Panic attacks: yes Agoraphobia: no   Obscessions/compulsions: no Depressed mood: no Depression screen [facility] 2/9 01/16/2021 05/12/2020 05/10/2019 05/03/2019 05/01/2018  Decreased Interest 0 0 0 0 0  Down, Depressed, Hopeless 0 0 0 0 0  PHQ - 2 Score 0 0 0 0 0  Altered sleeping 1 - - 0 -  Tired, decreased energy 1 - - 0 -  Change in appetite 0 - - 0 -  Feeling bad or failure about yourself  0 - - 0 -  Trouble concentrating 0 - - 0 -  Moving slowly or fidgety/restless 0 - - 0 -  Suicidal thoughts 0 - - 0 -  PHQ-9 Score 2 - - 0 -  Difficult doing work/chores Not difficult at all - - - -   Anhedonia: no Weight changes: no Insomnia: none Hypersomnia: no Fatigue/loss of energy: no Feelings of worthlessness: no Feelings of guilt: no Impaired concentration/indecisiveness: no Suicidal ideations: no  Crying spells: no Recent Stressors/Life Changes: no   Relationship problems: no   Family stress: no     Financial stress: no    Job stress: no    Recent death/loss: no GAD 7 : Generalized Anxiety Score 05/14/2021 01/16/2021 05/03/2019  Nervous, Anxious, on Edge 2 3 0  Control/stop worrying 1 0 0  Worry too much - different things 1 0 0  Trouble relaxing 1 0 0  Restless 0 0 0  Easily annoyed or irritable 0 0 0  Afraid - awful might happen 1 0 0  Total GAD 7 Score 6 3 0  Anxiety Difficulty Somewhat difficult Not difficult at all -     The patient does not have a history of falls. I did not complete a risk assessment for falls. A plan of care for falls was not documented.   Past Medical History:  Past Medical History:  Diagnosis Date  . Anxiety   . Cancer (HCC)    skin- squamous  . HOH (hard of hearing)    WEARS AIDS  . Hyperlipidemia   . Hypertension    CONTROLLED ON MEDS  . Hypothyroidism   . IBS (irritable bowel syndrome)   . Migraine   . MVP (mitral valve prolapse)   . OA (osteoarthritis) of neck    JOINTS  . Osteoporosis   . Thyroid disease     Surgical History:  Past Surgical History:  Procedure  Laterality Date  . ANKLE FRACTURE SURGERY Left   . BREAST CYST ASPIRATION Right 90s  . COLONOSCOPY WITH PROPOFOL N/A 04/12/2016   Procedure: COLONOSCOPY WITH PROPOFOL;  Surgeon: Manya Silvas, MD;  Location: [facility] ENDOSCOPY;  Service: Endoscopy;  Laterality: N/A;  . FRACTURE SURGERY     ANKLE  . HAMMER TOE SURGERY Right 02/08/2018   Procedure: HAMMER TOE CORRECTION-5TH TOE, Excision soft tissue mass dorsal lateral right fifth toe and exostectomy dorsal lateral DIPJ fifth toe;  Surgeon: Samara Deist, DPM;  Location: [facility];  Service: Podiatry;  Laterality: Right;  IVA LOCAL  . moses surgery  09/2015  . SINUS SURGERY WITH INSTATRAK  10/2017   DEVIATED SEPTUM  . 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 clobetasol ointment (TEMOVATE) 0.73 % Apply 1 application topically 2 (two) times daily.  . cyanocobalamin (,VITAMIN B-12,) 1000 MCG/ML injection Inject int muscle once a week for 3 weeks then once a month for 4  months  . ezetimibe (ZETIA) 10 MG tablet Take 1 tablet by mouth daily.  Marland Kitchen scopolamine (TRANSDERM-SCOP, 1.5 MG,) 1 MG/3DAYS Place 1 patch (1.5 mg total) onto the skin every 3 (three) days.  . baclofen (LIORESAL) 10 MG tablet Take by mouth. (Patient not taking: No sig reported)   No current facility-administered medications on file prior to visit.    Allergies:  Allergies  Allergen Reactions  . Amoxil [Amoxicillin] Itching  . Penicillins Other (See Comments)    "passed out"    Social History:  Social History   Socioeconomic History  . Marital status: Married    Spouse name: Not on file  . Number of children: Not on file  . Years of education: Not on file  . Highest education level: Master's degree (e.g., MA, MS, MEng, MEd, MSW, MBA)  Occupational History  . Occupation: retired  Tobacco Use  . Smoking status: Former Smoker    Packs/day: 1.00    Years:  4.00    Pack years: 4.00    Types: Cigarettes    Quit date: 1970    Years since quitting: 52.4  . Smokeless tobacco: Never Used  Vaping Use  . Vaping Use: Never used  Substance and Sexual Activity  . Alcohol use: Yes    Alcohol/week: 10.0 standard drinks    Types: 10 Glasses of wine per week  . Drug use: No  . Sexual activity: Yes    Birth control/protection: None    Comment: Married  Other Topics Concern  . Not on file  Social History Narrative  . Not on file   Social Determinants of Health   Financial Resource Strain: Not on file  Food Insecurity: Not on file  Transportation Needs: Not on file  Physical Activity: Not on file  Stress: Not on file  Social Connections: Not on file  Intimate Partner Violence: Not on file   Social History   Tobacco Use  Smoking Status Former Smoker  . Packs/day: 1.00  . Years: 4.00  . Pack years: 4.00  . Types: Cigarettes  . Quit date: 75  . Years since quitting: 52.4  Smokeless Tobacco Never Used   Social History   Substance and Sexual Activity  Alcohol Use Yes  . Alcohol/week: 10.0 standard drinks  . Types: 10 Glasses of wine per week    Family History:  Family History  Problem Relation Age of Onset  . Hypertension Mother   . Colon cancer Mother   . Lung cancer Father   . Brain cancer Father   . Hyperlipidemia Brother   . Breast cancer Neg Hx     Past medical history, surgical history, medications, allergies, family history and social history reviewed with patient today and changes made to appropriate areas of the chart.   ROS All other ROS negative except what is listed above and in the HPI.      Objective:    BP 120/64 (BP Location: Left Arm)   Pulse 80   Temp 98.8 F (37.1 C) (Oral)   Wt 150 lb 9.6 oz (68.3 kg)   LMP  (LMP Unknown)   SpO2 93%   BMI 24.31 kg/m   Wt Readings from Last 3 Encounters:  05/14/21 150 lb 9.6 oz (68.3 kg)  03/16/21 150 lb (68 kg)  01/16/21 152 lb 3.2 oz (69 kg)     Physical Exam Vitals and nursing note reviewed. Exam conducted with a chaperone present.  Constitutional:      General: She is  awake. She is not in acute distress.    Appearance: She is well-developed. She is not ill-appearing.  HENT:     Head: Normocephalic and atraumatic.     Right Ear: Hearing, tympanic membrane, ear canal and external ear normal. No drainage.     Left Ear: Hearing, tympanic membrane, ear canal and external ear normal. No drainage.     Nose: Nose normal.     Right Sinus: No maxillary sinus tenderness or frontal sinus tenderness.     Left Sinus: No maxillary sinus tenderness or frontal sinus tenderness.     Mouth/Throat:     Mouth: Mucous membranes are moist.     Pharynx: Oropharynx is clear. Uvula midline. No pharyngeal swelling, oropharyngeal exudate or posterior oropharyngeal erythema.  Eyes:     General: Lids are normal.        Right eye: No discharge.        Left eye: No discharge.     Extraocular Movements: Extraocular movements intact.     Conjunctiva/sclera: Conjunctivae normal.     Pupils: Pupils are equal, round, and reactive to light.     Visual Fields: Right eye visual fields normal and left eye visual fields normal.  Neck:     Thyroid: No thyromegaly.     Vascular: No carotid bruit.     Trachea: Trachea normal.  Cardiovascular:     Rate and Rhythm: Normal rate and regular rhythm.     Heart sounds: Normal heart sounds. No murmur heard. No gallop.   Pulmonary:     Effort: Pulmonary effort is normal. No accessory muscle usage or respiratory distress.     Breath sounds: Normal breath sounds.  Chest:  Breasts:     Right: Normal. No axillary adenopathy or supraclavicular adenopathy.     Left: Normal. No axillary adenopathy or supraclavicular adenopathy.    Abdominal:     General: Bowel sounds are normal.     Palpations: Abdomen is soft. There is no hepatomegaly or splenomegaly.     Tenderness: There is no abdominal tenderness.  Musculoskeletal:         General: Normal range of motion.     Cervical back: Normal range of motion and neck supple.     Right lower leg: No edema.     Left lower leg: No edema.  Lymphadenopathy:     Head:     Right side of head: No submental, submandibular, tonsillar, preauricular or posterior auricular adenopathy.     Left side of head: No submental, submandibular, tonsillar, preauricular or posterior auricular adenopathy.     Cervical: No cervical adenopathy.     Upper Body:     Right upper body: No supraclavicular, axillary or pectoral adenopathy.     Left upper body: No supraclavicular, axillary or pectoral adenopathy.  Skin:    General: Skin is warm and dry.     Capillary Refill: Capillary refill takes less than 2 seconds.     Findings: No rash.  Neurological:     Mental Status: She is alert and oriented to person, place, and time.     Cranial Nerves: Cranial nerves are intact.     Gait: Gait is intact.     Deep Tendon Reflexes: Reflexes are normal and symmetric.     Reflex Scores:      Brachioradialis reflexes are 2+ on the right side and 2+ on the left side.      Patellar reflexes are 2+ on the right side and 2+ on  the left side. Psychiatric:        Attention and Perception: Attention normal.        Mood and Affect: Mood normal.        Speech: Speech normal.        Behavior: Behavior normal. Behavior is cooperative.        Thought Content: Thought content normal.        Judgment: Judgment normal.    Results for orders placed or performed in visit on 05/14/21  Microalbumin, Urine Waived  Result Value Ref Range   Microalb, Ur Waived 10 0 - 19 mg/L   Creatinine, Urine Waived 50 10 - 300 mg/dL   Microalb/Creat Ratio <30 <30 mg/g      Assessment & Plan:   Problem List Items Addressed This Visit      Cardiovascular and Mediastinum   Essential hypertension    Chronic, ongoing with initial BP elevated due to anxiety and pain.  Repeat BP under goal.  Goal <130/80.  Recommend she monitor  BP at least a few mornings a week at home and document.  DASH diet at home.  Continue current medication regimen and adjust as needed.  Labs today: CMP, TSH, Lipid, CBC.  Return in 6 months.       Relevant Medications   ezetimibe (ZETIA) 10 MG tablet   metoprolol succinate (TOPROL-XL) 50 MG 24 hr tablet   Other Relevant Orders   CBC with Differential/Platelet   Microalbumin, Urine Waived (Completed)   Migraines    Chronic, ongoing, uncontrolled.  At this time will continue neurology collaboration.  May benefit from injectable migraine medications.  Suspect anxiety exacerbates migraines, will work on this -- refer to anxiety plan.  Will review neurology note from tomorrow when available.  No red flag symptoms today and normal neuro exam.      Relevant Medications   ezetimibe (ZETIA) 10 MG tablet   metoprolol succinate (TOPROL-XL) 50 MG 24 hr tablet   citalopram (CELEXA) 10 MG tablet   Aortic atherosclerosis ([place]) - Primary    Noted on imaging 03/17/20.  At this time continue Zetia and may continue restarting ASA in future for prevention. Educated patient.      Relevant Medications   ezetimibe (ZETIA) 10 MG tablet   metoprolol succinate (TOPROL-XL) 50 MG 24 hr tablet   Frequent PVCs    Continue collaboration with cardiology.  Recent Holter performed 10/14/20.      Relevant Medications   ezetimibe (ZETIA) 10 MG tablet   metoprolol succinate (TOPROL-XL) 50 MG 24 hr tablet     Endocrine   Hypothyroidism    Chronic, ongoing.  Continue current medication regimen and adjust as needed.  Obtain TSH and Free T4 today.         Relevant Medications   levothyroxine (SYNTHROID) 75 MCG tablet   metoprolol succinate (TOPROL-XL) 50 MG 24 hr tablet   Other Relevant Orders   TSH   T4, free     Musculoskeletal and Integument   Age-related osteoporosis without current pathological fracture    Chronic, ongoing, continue Boniva.  Check Vitamin D level today.  Repeat DEXA in February 2023.         Relevant Medications   ibandronate (BONIVA) 150 MG tablet     Other   Hyperlipidemia    Chronic, ongoing.  Continue current medication regimen and adjust as needed.  Tolerated statins poorly x 3 different kinds.  Continue Zetia at this time as prescribed by cardiology and could  consider Repatha in future.        Relevant Medications   ezetimibe (ZETIA) 10 MG tablet   metoprolol succinate (TOPROL-XL) 50 MG 24 hr tablet   Other Relevant Orders   Comprehensive metabolic panel   Lipid Panel w/o Chol/HDL Ratio   Chronic anxiety    Chronic, ongoing.  She is open to idea of trying SSRI today, Lexapro appears not to be covered and would require PA.  Would avoid Paxil due to age.  Trial Celexa 10 MG to start, script sent -- will see if covered, if not then consider Zoloft or Prozac.  Continue minimal use of Xanax, educated her at length on risk of long term use.  Goal with SSRI would be to minimize to discontinue Xanax.  She denies SI/HI.  Return in 4 weeks.      Relevant Medications   citalopram (CELEXA) 10 MG tablet   ALPRAZolam (XANAX) 0.25 MG tablet   Other Relevant Orders   [id number] 11+Oxyco+Alc+Crt-Bund   Vitamin B12 deficiency    Ongoing, noted on neurology labs 02/28/21 = 173.  Continue injections at neurology office and check level today.  If improving could consider transition to oral B12 1000 MCG daily.      Relevant Orders   Vitamin B12   Vitamin D deficiency    Ongoing, noted on neurology labs 02/28/21 = 25.9.  With her osteoporosis recommend she continue daily supplement 1000 to 2000 units and will recheck today.  Repeat DEXA in 2023.      Relevant Orders   VITAMIN D 25 Hydroxy (Vit-D Deficiency, Fractures)   Long term prescription benzodiazepine use    Has used Xanax since her 30's -- discussed with her today and goal is to reduce and discontinue.  Will start SSRI.  UDS today and will obtain contract next visit.      Relevant Orders   [id number] 11+Oxyco+Alc+Crt-Bund     Other Visit Diagnoses    Encounter for screening mammogram for malignant neoplasm of breast       Mammogram ordered   Relevant Orders   MM 3D SCREEN BREAST BILATERAL   Annual physical exam       Annual labs today and health maintenance reviewed.       Follow up plan: Return in about 4 weeks (around 06/11/2021) for MOOD FOLLOW-UP.   LABORATORY TESTING:  - Pap smear: not applicable  IMMUNIZATIONS:   - Tdap: Tetanus vaccination status reviewed: last tetanus booster within 10 years. - Influenza: Up to date - Pneumovax: Up to date - Prevnar: Up to date - HPV: Not applicable - Zostavax vaccine: hold off for now  SCREENING: -Mammogram: Ordered today  - Colonoscopy: Up to date -- she would like to continue these, next would be 11/22/2024  - Bone Density: Up to date due next year 2023 -Hearing Test: Not applicable  -Spirometry: Not applicable   PATIENT COUNSELING:   Advised to take 1 mg of folate supplement per day if capable of pregnancy.   Sexuality: Discussed sexually transmitted diseases, partner selection, use of condoms, avoidance of unintended pregnancy  and contraceptive alternatives.   Advised to avoid cigarette smoking.  I discussed with the patient that most people either abstain from alcohol or drink within safe limits (<=14/week and <=4 drinks/occasion for males, <=7/weeks and <= 3 drinks/occasion for females) and that the risk for alcohol disorders and other health effects rises proportionally with the number of drinks per week and how often a drinker exceeds daily limits.  Discussed cessation/primary prevention of drug use and availability of treatment for abuse.   Diet: Encouraged to adjust caloric intake to maintain  or achieve ideal body weight, to reduce intake of dietary saturated fat and total fat, to limit sodium intake by avoiding high sodium foods and not adding table salt, and to maintain adequate dietary potassium and calcium preferably from fresh fruits,  vegetables, and low-fat dairy products.    Stressed the importance of regular exercise  Injury prevention: Discussed safety belts, safety helmets, smoke detector, smoking near bedding or upholstery.   Dental health: Discussed importance of regular tooth brushing, flossing, and dental visits.    NEXT PREVENTATIVE PHYSICAL DUE IN 1 YEAR. Return in about 4 weeks (around 06/11/2021) for MOOD FOLLOW-UP.

## 2021-05-14 NOTE — Assessment & Plan Note (Signed)
Has used Xanax since her 66's -- discussed with her today and goal is to reduce and discontinue.  Will start SSRI.  UDS today and will obtain contract next visit.

## 2021-05-14 NOTE — Assessment & Plan Note (Signed)
Continue collaboration with cardiology.  Recent Holter performed 10/14/20.

## 2021-05-14 NOTE — Patient Instructions (Signed)

## 2021-05-14 NOTE — Assessment & Plan Note (Signed)
Ongoing, noted on neurology labs 02/28/21 = 173.  Continue injections at neurology office and check level today.  If improving could consider transition to oral B12 1000 MCG daily.

## 2021-05-14 NOTE — Assessment & Plan Note (Signed)
Noted on imaging 03/17/20.  At this time continue Zetia and may continue restarting ASA in future for prevention. Educated patient.

## 2021-05-14 NOTE — Assessment & Plan Note (Signed)
Chronic, ongoing.  Continue current medication regimen and adjust as needed.  Tolerated statins poorly x 3 different kinds.  Continue Zetia at this time as prescribed by cardiology and could consider Repatha in future.

## 2021-05-14 NOTE — Assessment & Plan Note (Signed)
Chronic, ongoing.  Continue current medication regimen and adjust as needed.  Obtain TSH and Free T4 today.

## 2021-05-14 NOTE — Assessment & Plan Note (Signed)
Chronic, ongoing, uncontrolled.  At this time will continue neurology collaboration.  May benefit from injectable migraine medications.  Suspect anxiety exacerbates migraines, will work on this -- refer to anxiety plan.  Will review neurology note from tomorrow when available.  No red flag symptoms today and normal neuro exam.

## 2021-05-14 NOTE — Assessment & Plan Note (Signed)
Chronic, ongoing.  She is open to idea of trying SSRI today, Lexapro appears not to be covered and would require PA.  Would avoid Paxil due to age.  Trial Celexa 10 MG to start, script sent -- will see if covered, if not then consider Zoloft or Prozac.  Continue minimal use of Xanax, educated her at length on risk of long term use.  Goal with SSRI would be to minimize to discontinue Xanax.  She denies SI/HI.  Return in 4 weeks.

## 2021-05-14 NOTE — Assessment & Plan Note (Signed)
Ongoing, noted on neurology labs 02/28/21 = 25.9.  With her osteoporosis recommend she continue daily supplement 1000 to 2000 units and will recheck today.  Repeat DEXA in 2023.

## 2021-05-14 NOTE — Assessment & Plan Note (Signed)
Chronic, ongoing with initial BP elevated due to anxiety and pain.  Repeat BP under goal.  Goal <130/80.  Recommend she monitor BP at least a few mornings a week at home and document.  DASH diet at home.  Continue current medication regimen and adjust as needed.  Labs today: CMP, TSH, Lipid, CBC.  Return in 6 months.

## 2021-05-14 NOTE — Assessment & Plan Note (Signed)
Chronic, ongoing, continue Boniva.  Check Vitamin D level today.  Repeat DEXA in February 2023.

## 2021-05-15 ENCOUNTER — Ambulatory Visit (INDEPENDENT_AMBULATORY_CARE_PROVIDER_SITE_OTHER): Payer: Medicare PPO

## 2021-05-15 ENCOUNTER — Other Ambulatory Visit: Payer: Self-pay | Admitting: Nurse Practitioner

## 2021-05-15 ENCOUNTER — Encounter: Payer: Self-pay | Admitting: Nurse Practitioner

## 2021-05-15 VITALS — Ht 66.0 in | Wt 150.0 lb

## 2021-05-15 DIAGNOSIS — M791 Myalgia, unspecified site: Secondary | ICD-10-CM | POA: Insufficient documentation

## 2021-05-15 DIAGNOSIS — Z0000 Encounter for general adult medical examination without abnormal findings: Secondary | ICD-10-CM | POA: Diagnosis not present

## 2021-05-15 DIAGNOSIS — T466X5A Adverse effect of antihyperlipidemic and antiarteriosclerotic drugs, initial encounter: Secondary | ICD-10-CM | POA: Insufficient documentation

## 2021-05-15 LAB — CBC WITH DIFFERENTIAL/PLATELET
Basophils Absolute: 0 10*3/uL (ref 0.0–0.2)
Basos: 1 %
EOS (ABSOLUTE): 0.1 10*3/uL (ref 0.0–0.4)
Eos: 2 %
Hematocrit: 43.8 % (ref 34.0–46.6)
Hemoglobin: 14.7 g/dL (ref 11.1–15.9)
Immature Grans (Abs): 0 10*3/uL (ref 0.0–0.1)
Immature Granulocytes: 0 %
Lymphocytes Absolute: 1.1 10*3/uL (ref 0.7–3.1)
Lymphs: 22 %
MCH: 32 pg (ref 26.6–33.0)
MCHC: 33.6 g/dL (ref 31.5–35.7)
MCV: 95 fL (ref 79–97)
Monocytes Absolute: 0.5 10*3/uL (ref 0.1–0.9)
Monocytes: 10 %
Neutrophils Absolute: 3 10*3/uL (ref 1.4–7.0)
Neutrophils: 65 %
Platelets: 198 10*3/uL (ref 150–450)
RBC: 4.59 x10E6/uL (ref 3.77–5.28)
RDW: 12.2 % (ref 11.7–15.4)
WBC: 4.7 10*3/uL (ref 3.4–10.8)

## 2021-05-15 LAB — DRUG SCREEN 764883 11+OXYCO+ALC+CRT-BUND
Amphetamines, Urine: NEGATIVE ng/mL
BENZODIAZ UR QL: NEGATIVE ng/mL
Barbiturate: NEGATIVE ng/mL
Cannabinoid Quant, Ur: NEGATIVE ng/mL
Cocaine (Metabolite): NEGATIVE ng/mL
Creatinine: 55.8 mg/dL (ref 20.0–300.0)
Ethanol: NEGATIVE %
Meperidine: NEGATIVE ng/mL
Methadone Screen, Urine: NEGATIVE ng/mL
OPIATE SCREEN URINE: NEGATIVE ng/mL
Oxycodone/Oxymorphone, Urine: NEGATIVE ng/mL
Phencyclidine: NEGATIVE ng/mL
Propoxyphene: NEGATIVE ng/mL
Tramadol: NEGATIVE ng/mL
pH, Urine: 5.5 (ref 4.5–8.9)

## 2021-05-15 LAB — COMPREHENSIVE METABOLIC PANEL
ALT: 9 IU/L (ref 0–32)
AST: 14 IU/L (ref 0–40)
Albumin/Globulin Ratio: 1.6 (ref 1.2–2.2)
Albumin: 4.1 g/dL (ref 3.7–4.7)
Alkaline Phosphatase: 51 IU/L (ref 44–121)
BUN/Creatinine Ratio: 13 (ref 12–28)
BUN: 10 mg/dL (ref 8–27)
Bilirubin Total: 0.5 mg/dL (ref 0.0–1.2)
CO2: 23 mmol/L (ref 20–29)
Calcium: 9.1 mg/dL (ref 8.7–10.3)
Chloride: 99 mmol/L (ref 96–106)
Creatinine, Ser: 0.8 mg/dL (ref 0.57–1.00)
Globulin, Total: 2.5 g/dL (ref 1.5–4.5)
Glucose: 78 mg/dL (ref 65–99)
Potassium: 4.1 mmol/L (ref 3.5–5.2)
Sodium: 141 mmol/L (ref 134–144)
Total Protein: 6.6 g/dL (ref 6.0–8.5)
eGFR: 76 mL/min/{1.73_m2} (ref >59)

## 2021-05-15 LAB — LIPID PANEL W/O CHOL/HDL RATIO
Cholesterol, Total: 280 mg/dL — ABNORMAL HIGH (ref 100–199)
HDL: 72 mg/dL (ref >39)
LDL Chol Calc (NIH): 181 mg/dL — ABNORMAL HIGH (ref 0–99)
Triglycerides: 150 mg/dL — ABNORMAL HIGH (ref 0–149)
VLDL Cholesterol Cal: 27 mg/dL (ref 5–40)

## 2021-05-15 LAB — T4, FREE: Free T4: 1.49 ng/dL (ref 0.82–1.77)

## 2021-05-15 LAB — TSH: TSH: 2.99 u[IU]/mL (ref 0.450–4.500)

## 2021-05-15 LAB — VITAMIN B12: Vitamin B-12: 1861 pg/mL — ABNORMAL HIGH (ref 232–1245)

## 2021-05-15 LAB — VITAMIN D 25 HYDROXY (VIT D DEFICIENCY, FRACTURES): Vit D, 25-Hydroxy: 27.6 ng/mL — ABNORMAL LOW (ref 30.0–100.0)

## 2021-05-15 MED ORDER — ESCITALOPRAM OXALATE 5 MG PO TABS: ORAL_TABLET | ORAL | 4 refills | Status: DC

## 2021-05-15 MED ORDER — SCOPOLAMINE 1 MG/3DAYS TD PT72: 1.0000 | MEDICATED_PATCH | TRANSDERMAL | 0 refills | Status: DC

## 2021-05-15 NOTE — Progress Notes (Signed)
I connected with Clancy Gourd today by telephone and verified that I am speaking with the correct person using two identifiers. Location patient: home Location provider: work Persons participating in the virtual visit: Savannah [name], Glenna Durand LPN.   I discussed the limitations, risks, security and privacy concerns of performing an evaluation and management service by telephone and the availability of in person appointments. I also discussed with the patient that there may be a patient responsible charge related to this service. The patient expressed understanding and verbally consented to this telephonic visit.    Interactive audio and video telecommunications were attempted between this provider and patient, however failed, due to patient having technical difficulties OR patient did not have access to video capability.  We continued and completed visit with audio only.     Vital signs may be patient reported or missing.  Subjective:   Savannah [name] is a 75 y.o. female who presents for Medicare Annual (Subsequent) preventive examination.  Review of Systems     Cardiac Risk Factors include: advanced age (>24men, >37 women);dyslipidemia;hypertension     Objective:    Today's Vitals   05/15/21 0941  Weight: 150 lb (68 kg)  Height: 5\' 6"  (1.676 m)  PainSc: 2    Body mass index is 24.21 kg/m.  Advanced Directives 05/15/2021 08/27/2020 05/12/2020 12/11/2019 05/01/2018 02/08/2018 04/17/2017  Does Patient Have a Medical Advance Directive? Yes No Yes No Yes Yes Yes  Type of Paramedic of [place];Living will - Living will;Healthcare Power of [place];Living will [place];Living will [place];Living will  Does patient want to make changes to medical advance directive? - - - - - - No - Patient declined  Copy of [place] in Chart? No - copy requested - No - copy requested - No - copy  requested Yes No - copy requested  Would patient like information on creating a medical advance directive? - No - Patient declined - No - Patient declined - - -    Current Medications (verified) Outpatient Encounter Medications as of 05/15/2021  Medication Sig  . ALPRAZolam (XANAX) 0.25 MG tablet Take 1 tablet (0.25 mg total) by mouth at bedtime as needed for anxiety.  . baclofen (LIORESAL) 10 MG tablet Take by mouth. (Patient not taking: No sig reported)  . citalopram (CELEXA) 10 MG tablet Take 1 tablet (10 mg total) by mouth daily.  . clobetasol ointment (TEMOVATE) 3.08 % Apply 1 application topically 2 (two) times daily.  . cyanocobalamin (,VITAMIN B-12,) 1000 MCG/ML injection Inject int muscle once a week for 3 weeks then once a month for 4 months  . ezetimibe (ZETIA) 10 MG tablet Take 1 tablet by mouth daily.  Marland Kitchen ibandronate (BONIVA) 150 MG tablet Take in the morning with a full glass of water, on an empty stomach, and do not take anything else by mouth or lie down for the next 30 min.  Marland Kitchen levothyroxine (SYNTHROID) 75 MCG tablet Take 1 tablet (75 mcg total) by mouth daily before breakfast.  . metoprolol succinate (TOPROL-XL) 50 MG 24 hr tablet Take with or immediately following a meal.Take 1/2 tablet (25 mg) daily with or immediately following a meal.  . scopolamine (TRANSDERM-SCOP, 1.5 MG,) 1 MG/3DAYS Place 1 patch (1.5 mg total) onto the skin every 3 (three) days.   No facility-administered encounter medications on file as of 05/15/2021.    Allergies (verified) Amoxil [amoxicillin] and Penicillins   History:  Past Medical History:  Diagnosis Date  . Anxiety   . Cancer (HCC)    skin- squamous  . HOH (hard of hearing)    WEARS AIDS  . Hyperlipidemia   . Hypertension    CONTROLLED ON MEDS  . Hypothyroidism   . IBS (irritable bowel syndrome)   . Migraine   . MVP (mitral valve prolapse)   . OA (osteoarthritis) of neck    JOINTS  . Osteoporosis   . Thyroid disease    Past  Surgical History:  Procedure Laterality Date  . ANKLE FRACTURE SURGERY Left   . BREAST CYST ASPIRATION Right 90s  . COLONOSCOPY WITH PROPOFOL N/A 04/12/2016   Procedure: COLONOSCOPY WITH PROPOFOL;  Surgeon: Manya Silvas, MD;  Location[facility] ENDOSCOPY;  Service: Endoscopy;  Laterality: N/A;  . FRACTURE SURGERY     ANKLE  . HAMMER TOE SURGERY Right 02/08/2018   Procedure: HAMMER TOE CORRECTION-5TH TOE, Excision soft tissue mass dorsal lateral right fifth toe and exostectomy dorsal lateral DIPJ fifth toe;  Surgeon: Samara Deist, DPM;  Location: [facility];  Service: Podiatry;  Laterality: Right;  IVA LOCAL  . moses surgery  09/2015  . SINUS SURGERY WITH INSTATRAK  10/2017   DEVIATED SEPTUM  . 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 Hypertension Mother   . Colon cancer Mother   . Lung cancer Father   . Brain cancer Father   . Hyperlipidemia Brother   . Breast cancer Neg Hx    Social History   Socioeconomic History  . Marital status: Married    Spouse name: Not on file  . Number of children: Not on file  . Years of education: Not on file  . Highest education level: Master's degree (e.g., MA, MS, MEng, MEd, MSW, MBA)  Occupational History  . Occupation: retired  Tobacco Use  . Smoking status: Former Smoker    Packs/day: 1.00    Years: 4.00    Pack years: 4.00    Types: Cigarettes    Quit date: 1970    Years since quitting: 52.4  . Smokeless tobacco: Never Used  Vaping Use  . Vaping Use: Never used  Substance and Sexual Activity  . Alcohol use: Yes    Alcohol/week: 10.0 standard drinks    Types: 10 Glasses of wine per week  . Drug use: No  . Sexual activity: Yes    Birth control/protection: None    Comment: Married  Other Topics Concern  . Not on file  Social History Narrative  . Not on file   Social Determinants of Health   Financial Resource Strain: Low Risk   . Difficulty of Paying  Living Expenses: Not hard at all  Food Insecurity: No Food Insecurity  . Worried About Charity fundraiser in the Last Year: Never true  . Ran Out of Food in the Last Year: Never true  Transportation Needs: No Transportation Needs  . Lack of Transportation (Medical): No  . Lack of Transportation (Non-Medical): No  Physical Activity: Inactive  . Days of Exercise per Week: 0 days  . Minutes of Exercise per Session: 0 min  Stress: No Stress Concern Present  . Feeling of Stress : Not at all  Social Connections: Not on file    Tobacco Counseling Counseling given: Not Answered   Clinical Intake:  Pre-visit preparation completed: Yes  Pain : 0-10 Pain Score: 2  Pain Type:  Chronic pain Pain Location: Head Pain Descriptors / Indicators: Aching Pain Onset: More than a month ago Pain Frequency: Intermittent     Nutritional Status: BMI of 19-24  Normal Nutritional Risks: None Diabetes: No  How often do you need to have someone help you when you read instructions, pamphlets, or other written materials from your doctor or pharmacy?: 1 - Never What is the last grade level you completed in school?: masters degree  Diabetic? no     Information entered by :: NAllen LPN   Activities of Daily Living In your present state of health, do you have any difficulty performing the following activities: 05/15/2021 05/14/2021  Hearing? N N  Comment has hearing aide -  Vision? N N  Difficulty concentrating or making decisions? N N  Walking or climbing stairs? N N  Dressing or bathing? N N  Doing errands, shopping? N N  Preparing Food and eating ? N -  Using the Toilet? N -  In the past six months, have you accidently leaked urine? N -  Do you have problems with loss of bowel control? N -  Managing your Medications? N -  Managing your Finances? N -  Housekeeping or managing your Housekeeping? N -  Some recent data might be hidden    Patient Care Team: Venita Lick, NP as PCP -  General (Nurse Practitioner) Beverly Gust, MD (Unknown Physician Specialty) Oneta Rack, MD (Dermatology) Vladimir Crofts, MD (Neurology) Will Bonnet, MD as Attending Physician (Obstetrics and Gynecology) Efrain Sella, MD as Consulting Physician (Gastroenterology) Odette Fraction (Optometry) Corey Skains, MD as Consulting Physician (Cardiology) Billey Co, MD as Consulting Physician (Urology) Earnestine Leys, MD (Orthopedic Surgery)  Indicate any recent Medical Services you may have received from other than [facility] providers in the past year (date may be approximate).     Assessment:   This is a routine wellness examination for [facility].  Hearing/Vision screen  Hearing Screening   125Hz  250Hz  500Hz  1000Hz  2000Hz  3000Hz  4000Hz  6000Hz  8000Hz   Right ear:           Left ear:           Vision Screening Comments: Regular eye exams,   Dietary issues and exercise activities discussed: Current Exercise Habits: The patient does not participate in regular exercise at present  Goals Addressed            This Visit's Progress   . Patient Stated       05/15/2021, get rid of headaches      Depression Screen PHQ 2/9 Scores 05/15/2021 01/16/2021 05/12/2020 05/10/2019 05/03/2019 05/01/2018 04/15/2017  PHQ - 2 Score 0 0 0 0 0 0 0  PHQ- 9 Score - 2 - - 0 - 0    Fall Risk Fall Risk  05/15/2021 05/14/2021 05/12/2020 05/10/2019 05/03/2019  Falls in the past year? 0 0 0 0 0  Number falls in past yr: - 0 0 - 0  Injury with Fall? - 0 0 - 0  Risk for fall due to : Medication side effect - - - -  Follow up Falls evaluation completed;Education provided;Falls prevention discussed Falls evaluation completed - - -    FALL RISK PREVENTION PERTAINING TO THE HOME:  Any stairs in or around the home? Yes  If so, are there any without handrails? No  Home free of loose throw rugs in walkways, pet beds, electrical cords, etc? Yes  Adequate lighting in your home to reduce risk  of falls?  Yes   ASSISTIVE DEVICES UTILIZED TO PREVENT FALLS:  Life alert? No  Use of a cane, walker or w/c? No  Grab bars in the bathroom? No  Shower chair or bench in shower? No  Elevated toilet seat or a handicapped toilet? Yes   TIMED UP AND GO:  Was the test performed? No .   Cognitive Function:     6CIT Screen 05/15/2021 05/12/2020 05/10/2019 05/01/2018  What Year? 0 points 0 points 0 points 0 points  What month? 0 points 0 points 0 points 0 points  What time? 0 points 0 points 0 points 0 points  Count back from 20 0 points 0 points 0 points 0 points  Months in reverse 0 points 0 points 0 points 0 points  Repeat phrase 0 points 0 points 0 points 0 points  Total Score 0 0 0 0    Immunizations Immunization History  Administered Date(s) Administered  . Influenza, High Dose Seasonal PF 09/07/2018, 08/17/2019  . Influenza-Unspecified 10/20/2015, 11/03/2017, 09/10/2020  . Moderna Sars-Covid-2 Vaccination 01/10/2020, 02/06/2020, 08/11/2020  . Pneumococcal Conjugate-13 09/16/2015  . Pneumococcal Polysaccharide-23 07/03/2013  . Td 01/31/2007  . Tdap 05/01/2018  . Zoster, Live 02/18/2009    TDAP status: Up to date  Flu Vaccine status: Up to date  Pneumococcal vaccine status: Up to date  Covid-19 vaccine status: Completed vaccines  Qualifies for Shingles Vaccine? Yes   Zostavax completed Yes   Shingrix Completed?: No.    Education has been provided regarding the importance of this vaccine. Patient has been advised to call insurance company to determine out of pocket expense if they have not yet received this vaccine. Advised may also receive vaccine at local pharmacy or Health Dept. Verbalized acceptance and understanding.  Screening Tests Health Maintenance  Topic Date Due  . Zoster Vaccines- Shingrix (1 of 2) Never done  . INFLUENZA VACCINE  07/20/2021  . COLONOSCOPY (Pts 45-76yrs Insurance coverage will need to be confirmed)  11/22/2024  . TETANUS/TDAP  05/01/2028  . DEXA  SCAN  Completed  . COVID-19 Vaccine  Completed  . Hepatitis C Screening  Completed  . PNA vac Low Risk Adult  Completed  . HPV VACCINES  Aged Out    Health Maintenance  Health Maintenance Due  Topic Date Due  . Zoster Vaccines- Shingrix (1 of 2) Never done    Colorectal cancer screening: Type of screening: Colonoscopy. Completed 11/23/2019. Repeat every 5 years  Mammogram status: Completed 07/28/2020. Repeat every year  Bone Density status: Completed 01/23/2020. Results reflect:   Lung Cancer Screening: (Low Dose CT Chest recommended if Age 27-80 years, 30 pack-year currently smoking OR have quit w/in 15years.) does not qualify.   Lung Cancer Screening Referral: no  Additional Screening:  Hepatitis C Screening: does qualify; Completed 09/16/2015  Vision Screening: Recommended annual ophthalmology exams for early detection of glaucoma and other disorders of the eye. Is the patient up to date with their annual eye exam?  Yes  Who is the provider or what is the name of the office in which the patient attends annual eye exams? [facility] If pt is not established with a provider, would they like to be referred to a provider to establish care? No .   Dental Screening: Recommended annual dental exams for proper oral hygiene  Community Resource Referral / Chronic Care Management: CRR required this visit?  No   CCM required this visit?  No      Plan:  I have personally reviewed and noted the following in the patient's chart:   . Medical and social history . Use of alcohol, tobacco or illicit drugs  . Current medications and supplements including opioid prescriptions.  . Functional ability and status . Nutritional status . Physical activity . Advanced directives . List of other physicians . Hospitalizations, surgeries, and ER visits in previous 12 months . Vitals . Screenings to include cognitive, depression, and falls . Referrals and appointments  In addition, I  have reviewed and discussed with patient certain preventive protocols, quality metrics, and best practice recommendations. A written personalized care plan for preventive services as well as general preventive health recommendations were provided to patient.     Kellie Simmering, LPN   [id number]   Nurse Notes:

## 2021-05-15 NOTE — Telephone Encounter (Signed)
Medication Refill - Medication: scopolamin patches #4. Pt seen jolene on 05-14-2021  Has the patient contacted their pharmacy? Yes was told to call provider Preferred Pharmacy (with phone number or street name): [facility] in [place] phone number [phone]  Agent: Please be advised that RX refills may take up to 3 business days. We ask that you follow-up with your pharmacy.

## 2021-05-15 NOTE — Telephone Encounter (Signed)
Pt has apt on 06/11/2021

## 2021-05-15 NOTE — Progress Notes (Signed)
Contacted via MyChart   Good afternoon Ms. Dollard, your labs have returned: - CBC shows no anemia and all nice and normal. - CMP shows normal kidney function, creatinine and eGFR, and normal liver function, AST and ALT. - Thyroid labs are normal, continue current dosing Levothyroxine - Vitamin D level a little on low side, recommend taking Vitamin D3 2000 units daily to get this to >30. - B12 level nice and normal now, your could actually start taking oral supplements daily versus injections.  I recommend taking over the counter Vitamin B12 500 to 1000 MCG daily. - Cholesterol levels remain elevated -- I do believe you would benefit from Repatha, the injectable, since you did not tolerate statins.  Continue Zetia for now.  Will see if I can reach out to Dr. Nehemiah Massed on this and see if we can get this approved.  Any questions? Keep being awesome!!  Thank you for allowing me to participate in your care.  I appreciate you. Kindest regards, Bassam Dresch

## 2021-05-15 NOTE — Telephone Encounter (Signed)
Requested medication (s) are due for refill today: expired medication  Requested medication (s) are on the active medication list: yes  Last refill:  05/03/2019 #2 patches 0 refills   Future visit scheduled: future in 3 weeks, last seen yesterday   Notes to clinic:  expired medication, medication not assigned to a protocol     Requested Prescriptions  Pending Prescriptions Disp Refills   scopolamine (TRANSDERM-SCOP, 1.5 MG,) 1 MG/3DAYS 2 patch 0    Sig: Place 1 patch (1.5 mg total) onto the skin every 3 (three) days.      Off-Protocol Failed - 05/15/2021 10:38 AM      Failed - Medication not assigned to a protocol, review manually.      Passed - Valid encounter within last 12 months    Recent Outpatient Visits           Yesterday Aortic atherosclerosis ([place])   [place], Henrine Screws T, NP   2 months ago Upper respiratory tract infection, unspecified type   [facility], Lauren A, NP   3 months ago Chronic intractable headache, unspecified headache type   [place], NP   11 months ago Anal lesion   [facility], Lilia Argue, [place]   1 year ago Essential hypertension   [place], Lilia Argue, [place]       Future Appointments             In 3 weeks Cannady, Barbaraann Faster, NP [employer], [place]   In 1 year  [facility]

## 2021-05-15 NOTE — Patient Instructions (Signed)
Savannah [name] , Thank you for taking time to come for your Medicare Wellness Visit. I appreciate your ongoing commitment to your health goals. Please review the following plan we discussed and let me know if I can assist you in the future.   Screening recommendations/referrals: Colonoscopy: completed 11/23/2019 Mammogram: completed 07/28/2020 Bone Density: completed 01/23/2020 Recommended yearly ophthalmology/optometry visit for glaucoma screening and checkup Recommended yearly dental visit for hygiene and checkup  Vaccinations: Influenza vaccine: completed 09/10/2020 Pneumococcal vaccine: completed 09/16/2015 Tdap vaccine: completed 05/01/2018, due 05/01/2028 Shingles vaccine: discussed   Covid-19: 08/11/2020, 02/06/2020, 01/10/2020  Advanced directives: Please bring a copy of your POA (Power of Attorney) and/or Living Will to your next appointment.   Conditions/risks identified: none  Next appointment: Follow up in one year for your annual wellness visit    Preventive Care 65 Years and Older, Female Preventive care refers to lifestyle choices and visits with your health care provider that can promote health and wellness. What does preventive care include?  A yearly physical exam. This is also called an annual well check.  Dental exams once or twice a year.  Routine eye exams. Ask your health care provider how often you should have your eyes checked.  Personal lifestyle choices, including:  Daily care of your teeth and gums.  Regular physical activity.  Eating a healthy diet.  Avoiding tobacco and drug use.  Limiting alcohol use.  Practicing safe sex.  Taking low-dose aspirin every day.  Taking vitamin and mineral supplements as recommended by your health care provider. What happens during an annual well check? The services and screenings done by your health care provider during your annual well check will depend on your age, overall health, lifestyle risk factors, and family  history of disease. Counseling  Your health care provider may ask you questions about your:  Alcohol use.  Tobacco use.  Drug use.  Emotional well-being.  Home and relationship well-being.  Sexual activity.  Eating habits.  History of falls.  Memory and ability to understand (cognition).  Work and work Statistician.  Reproductive health. Screening  You may have the following tests or measurements:  Height, weight, and BMI.  Blood pressure.  Lipid and cholesterol levels. These may be checked every 5 years, or more frequently if you are over 93 years old.  Skin check.  Lung cancer screening. You may have this screening every year starting at age 38 if you have a 30-pack-year history of smoking and currently smoke or have quit within the past 15 years.  Fecal occult blood test (FOBT) of the stool. You may have this test every year starting at age 14.  Flexible sigmoidoscopy or colonoscopy. You may have a sigmoidoscopy every 5 years or a colonoscopy every 10 years starting at age 73.  Hepatitis C blood test.  Hepatitis B blood test.  Sexually transmitted disease (STD) testing.  Diabetes screening. This is done by checking your blood sugar (glucose) after you have not eaten for a while (fasting). You may have this done every 1-3 years.  Bone density scan. This is done to screen for osteoporosis. You may have this done starting at age 6.  Mammogram. This may be done every 1-2 years. Talk to your health care provider about how often you should have regular mammograms. Talk with your health care provider about your test results, treatment options, and if necessary, the need for more tests. Vaccines  Your health care provider may recommend certain vaccines, such as:  Influenza vaccine. This  is recommended every year.  Tetanus, diphtheria, and acellular pertussis (Tdap, Td) vaccine. You may need a Td booster every 10 years.  Zoster vaccine. You may need this after  age 93.  Pneumococcal 13-valent conjugate (PCV13) vaccine. One dose is recommended after age 79.  Pneumococcal polysaccharide (PPSV23) vaccine. One dose is recommended after age 57. Talk to your health care provider about which screenings and vaccines you need and how often you need them. This information is not intended to replace advice given to you by your health care provider. Make sure you discuss any questions you have with your health care provider. Document Released: 01/02/2016 Document Revised: 08/25/2016 Document Reviewed: 10/07/2015 Elsevier Interactive Patient Education  2017 [employer] Prevention in the Home Falls can cause injuries. They can happen to people of all ages. There are many things you can do to make your home safe and to help prevent falls. What can I do on the outside of my home?  Regularly fix the edges of walkways and driveways and fix any cracks.  Remove anything that might make you trip as you walk through a door, such as a raised step or threshold.  Trim any bushes or trees on the path to your home.  Use bright outdoor lighting.  Clear any walking paths of anything that might make someone trip, such as rocks or tools.  Regularly check to see if handrails are loose or broken. Make sure that both sides of any steps have handrails.  Any raised decks and porches should have guardrails on the edges.  Have any leaves, snow, or ice cleared regularly.  Use sand or salt on walking paths during winter.  Clean up any spills in your garage right away. This includes oil or grease spills. What can I do in the bathroom?  Use night lights.  Install grab bars by the toilet and in the tub and shower. Do not use towel bars as grab bars.  Use non-skid mats or decals in the tub or shower.  If you need to sit down in the shower, use a plastic, non-slip stool.  Keep the floor dry. Clean up any water that spills on the floor as soon as it  happens.  Remove soap buildup in the tub or shower regularly.  Attach bath mats securely with double-sided non-slip rug tape.  Do not have throw rugs and other things on the floor that can make you trip. What can I do in the bedroom?  Use night lights.  Make sure that you have a light by your bed that is easy to reach.  Do not use any sheets or blankets that are too big for your bed. They should not hang down onto the floor.  Have a firm chair that has side arms. You can use this for support while you get dressed.  Do not have throw rugs and other things on the floor that can make you trip. What can I do in the kitchen?  Clean up any spills right away.  Avoid walking on wet floors.  Keep items that you use a lot in easy-to-reach places.  If you need to reach something above you, use a strong step stool that has a grab bar.  Keep electrical cords out of the way.  Do not use floor polish or wax that makes floors slippery. If you must use wax, use non-skid floor wax.  Do not have throw rugs and other things on the floor that can make you  trip. What can I do with my stairs?  Do not leave any items on the stairs.  Make sure that there are handrails on both sides of the stairs and use them. Fix handrails that are broken or loose. Make sure that handrails are as long as the stairways.  Check any carpeting to make sure that it is firmly attached to the stairs. Fix any carpet that is loose or worn.  Avoid having throw rugs at the top or bottom of the stairs. If you do have throw rugs, attach them to the floor with carpet tape.  Make sure that you have a light switch at the top of the stairs and the bottom of the stairs. If you do not have them, ask someone to add them for you. What else can I do to help prevent falls?  Wear shoes that:  Do not have high heels.  Have rubber bottoms.  Are comfortable and fit you well.  Are closed at the toe. Do not wear sandals.  If you  use a stepladder:  Make sure that it is fully opened. Do not climb a closed stepladder.  Make sure that both sides of the stepladder are locked into place.  Ask someone to hold it for you, if possible.  Clearly mark and make sure that you can see:  Any grab bars or handrails.  First and last steps.  Where the edge of each step is.  Use tools that help you move around (mobility aids) if they are needed. These include:  Canes.  Walkers.  Scooters.  Crutches.  Turn on the lights when you go into a dark area. Replace any light bulbs as soon as they burn out.  Set up your furniture so you have a clear path. Avoid moving your furniture around.  If any of your floors are uneven, fix them.  If there are any pets around you, be aware of where they are.  Review your medicines with your doctor. Some medicines can make you feel dizzy. This can increase your chance of falling. Ask your doctor what other things that you can do to help prevent falls. This information is not intended to replace advice given to you by your health care provider. Make sure you discuss any questions you have with your health care provider. Document Released: 10/02/2009 Document Revised: 05/13/2016 Document Reviewed: 01/10/2015 Elsevier Interactive Patient Education  2017 [employer].

## 2021-05-19 DIAGNOSIS — G43119 Migraine with aura, intractable, without status migrainosus: Secondary | ICD-10-CM | POA: Insufficient documentation

## 2021-05-19 DIAGNOSIS — R519 Headache, unspecified: Secondary | ICD-10-CM | POA: Insufficient documentation

## 2021-06-11 ENCOUNTER — Ambulatory Visit (INDEPENDENT_AMBULATORY_CARE_PROVIDER_SITE_OTHER): Payer: Medicare PPO | Admitting: Nurse Practitioner

## 2021-06-11 ENCOUNTER — Telehealth: Payer: Self-pay | Admitting: *Deleted

## 2021-06-11 ENCOUNTER — Other Ambulatory Visit: Payer: Self-pay

## 2021-06-11 ENCOUNTER — Encounter: Payer: Self-pay | Admitting: Nurse Practitioner

## 2021-06-11 VITALS — BP 132/73 | HR 62 | Temp 98.3°F | Wt 148.4 lb

## 2021-06-11 DIAGNOSIS — E7800 Pure hypercholesterolemia, unspecified: Secondary | ICD-10-CM

## 2021-06-11 DIAGNOSIS — F419 Anxiety disorder, unspecified: Secondary | ICD-10-CM

## 2021-06-11 MED ORDER — ESCITALOPRAM OXALATE 10 MG PO TABS: 15.0000 mg | ORAL_TABLET | Freq: Every day | ORAL | 4 refills | Status: DC

## 2021-06-11 MED ORDER — ESCITALOPRAM OXALATE 10 MG PO TABS: 10.0000 mg | ORAL_TABLET | Freq: Every day | ORAL | 4 refills | Status: DC

## 2021-06-11 NOTE — Assessment & Plan Note (Signed)
Chronic, ongoing.  She has had improved mood and less migraines with addition of Lexapro -- would like to trial increase to 15 MG, new script sent.  Continue minimal use of Xanax, educated her at length on risk of long term use -- has been able to cut back on this with Lexapro on board.  Goal with SSRI would be to minimize to discontinue Xanax.  She denies SI/HI.  Return in 6 months.

## 2021-06-11 NOTE — Patient Instructions (Signed)

## 2021-06-11 NOTE — Progress Notes (Signed)
BP 132/73 (BP Location: Left Arm, Cuff Size: Normal)   Pulse 62   Temp 98.3 F (36.8 C) (Oral)   Wt 148 lb 6.4 oz (67.3 kg)   LMP  (LMP Unknown)   SpO2 96%   BMI 23.95 kg/m    Subjective:    Patient ID: Savannah [name], female    DOB: [birth year], 75 y.o.   MRN: [id number]  HPI: Savannah [name] is a 75 y.o. female  Chief Complaint  Patient presents with   Mood    Patient states she would like to discuss the Lexapro and next recommended steps. Also, patient would like to discuss the dosage of the medication. Patient is requesting to have her Cholesterol checked at today's visit.    ANXIETY/STRESS Started on Lexapro 10 MG last visit, is tolerating.  She reports wanting to try to increase to 15 MG.  She reports not needing to use her Xanax as even her migraines are better.  Continues taking Xanax 0.25 MG as needed daily for anxiety -- last fill 05/14/21 on PDMP review.  Pt is aware of risks of benzo medication use to include increased sedation, respiratory suppression, falls, dependence and cardiovascular events.  Pt would like to continue treatment as benefit determined to outweigh risk -- she has been on Xanax since her 30's.   Duration:stable Anxious mood: yes  Excessive worrying: yes Irritability: no  Sweating: no Nausea: no Palpitations:no Hyperventilation: no Panic attacks: yes Agoraphobia: no  Obscessions/compulsions: no Depressed mood: no Depression screen [facility] 2/9 06/11/2021 05/15/2021 01/16/2021 05/12/2020 05/10/2019  Decreased Interest 0 0 0 0 0  Down, Depressed, Hopeless 0 0 0 0 0  PHQ - 2 Score 0 0 0 0 0  Altered sleeping 0 - 1 - -  Tired, decreased energy 0 - 1 - -  Change in appetite 0 - 0 - -  Feeling bad or failure about yourself  0 - 0 - -  Trouble concentrating 0 - 0 - -  Moving slowly or fidgety/restless 0 - 0 - -  Suicidal thoughts 0 - 0 - -  PHQ-9 Score 0 - 2 - -  Difficult doing work/chores Not difficult at all - Not difficult at all - -   Anhedonia: no Weight  changes: no Insomnia: none Hypersomnia: no Fatigue/loss of energy: no Feelings of worthlessness: no Feelings of guilt: no Impaired concentration/indecisiveness: no Suicidal ideations: no  Crying spells: no Recent Stressors/Life Changes: no   Relationship problems: no   Family stress: no     Financial stress: no    Job stress: no    Recent death/loss: no GAD 7 : Generalized Anxiety Score 06/11/2021 05/14/2021 01/16/2021 05/03/2019  Nervous, Anxious, on Edge _0 0  Control/stop worrying 1 1 0 0  Worry too much - different things 1 1 0 0  Trouble relaxing 0 1 0 0  Restless 0 0 0 0  Easily annoyed or irritable 0 0 0 0  Afraid - awful might happen 0 1 0 0  Total GAD 7 Score _1 0  Anxiety Difficulty Not difficult at all Somewhat difficult Not difficult at all -     Relevant past medical, surgical, family and social history reviewed and updated as indicated. Interim medical history since our last visit reviewed. Allergies and medications reviewed and updated.  Review of Systems  Constitutional:  Negative for activity change, appetite change, diaphoresis, fatigue and fever.  Respiratory: Negative.    Cardiovascular: Negative.  Gastrointestinal: Negative.   Neurological: Negative.   Psychiatric/Behavioral: Negative.     Per HPI unless specifically indicated above     Objective:    BP 132/73 (BP Location: Left Arm, Cuff Size: Normal)   Pulse 62   Temp 98.3 F (36.8 C) (Oral)   Wt 148 lb 6.4 oz (67.3 kg)   LMP  (LMP Unknown)   SpO2 96%   BMI 23.95 kg/m   Wt Readings from Last 3 Encounters:  06/11/21 148 lb 6.4 oz (67.3 kg)  05/15/21 150 lb (68 kg)  05/14/21 150 lb 9.6 oz (68.3 kg)    Physical Exam Vitals and nursing note reviewed.  Constitutional:      General: She is awake. She is not in acute distress.    Appearance: She is well-developed and well-groomed. She is not ill-appearing or toxic-appearing.  HENT:     Head: Normocephalic.     Right Ear: Hearing  normal.     Left Ear: Hearing normal.  Eyes:     General: Lids are normal.        Right eye: No discharge.        Left eye: No discharge.     Conjunctiva/sclera: Conjunctivae normal.     Pupils: Pupils are equal, round, and reactive to light.  Neck:     Thyroid: No thyromegaly.     Vascular: No carotid bruit.  Cardiovascular:     Rate and Rhythm: Normal rate and regular rhythm.     Heart sounds: Normal heart sounds. No murmur heard.   No gallop.  Pulmonary:     Effort: Pulmonary effort is normal. No accessory muscle usage or respiratory distress.     Breath sounds: Normal breath sounds.  Abdominal:     General: Bowel sounds are normal.     Palpations: Abdomen is soft.  Musculoskeletal:     Cervical back: Normal range of motion and neck supple.     Right lower leg: No edema.     Left lower leg: No edema.  Skin:    General: Skin is warm and dry.  Neurological:     Mental Status: She is alert and oriented to person, place, and time.  Psychiatric:        Attention and Perception: Attention normal.        Mood and Affect: Mood normal.        Speech: Speech normal.        Behavior: Behavior normal. Behavior is cooperative.        Thought Content: Thought content normal.    Results for orders placed or performed in visit on 05/14/21  CBC with Differential/Platelet  Result Value Ref Range   WBC 4.7 3.4 - 10.8 x10E3/uL   RBC 4.59 3.77 - 5.28 x10E6/uL   Hemoglobin 14.7 11.1 - 15.9 g/dL   Hematocrit 43.8 34.0 - 46.6 %   MCV 95 79 - 97 fL   MCH 32.0 26.6 - 33.0 pg   MCHC 33.6 31.5 - 35.7 g/dL   RDW 12.2 11.7 - 15.4 %   Platelets 198 150 - 450 x10E3/uL   Neutrophils 65 Not Estab. %   Lymphs 22 Not Estab. %   Monocytes 10 Not Estab. %   Eos 2 Not Estab. %   Basos 1 Not Estab. %   Neutrophils Absolute 3.0 1.4 - 7.0 x10E3/uL   Lymphocytes Absolute 1.1 0.7 - 3.1 x10E3/uL   Monocytes Absolute 0.5 0.1 - 0.9 x10E3/uL   EOS (ABSOLUTE) 0.1 0.0 -  0.4 x10E3/uL   Basophils Absolute  0.0 0.0 - 0.2 x10E3/uL   Immature Granulocytes 0 Not Estab. %   Immature Grans (Abs) 0.0 0.0 - 0.1 x10E3/uL  Comprehensive metabolic panel  Result Value Ref Range   Glucose 78 65 - 99 mg/dL   BUN 10 8 - 27 mg/dL   Creatinine, Ser 0.80 0.57 - 1.00 mg/dL   eGFR 76 >59 mL/min/1.73   BUN/Creatinine Ratio 13 12 - 28   Sodium 141 134 - 144 mmol/L   Potassium 4.1 3.5 - 5.2 mmol/L   Chloride 99 96 - 106 mmol/L   CO2 23 20 - 29 mmol/L   Calcium 9.1 8.7 - 10.3 mg/dL   Total Protein 6.6 6.0 - 8.5 g/dL   Albumin 4.1 3.7 - 4.7 g/dL   Globulin, Total 2.5 1.5 - 4.5 g/dL   Albumin/Globulin Ratio 1.6 1.2 - 2.2   Bilirubin Total 0.5 0.0 - 1.2 mg/dL   Alkaline Phosphatase 51 44 - 121 IU/L   AST 14 0 - 40 IU/L   ALT 9 0 - 32 IU/L  Lipid Panel w/o Chol/HDL Ratio  Result Value Ref Range   Cholesterol, Total 280 (H) 100 - 199 mg/dL   Triglycerides 150 (H) 0 - 149 mg/dL   HDL 72 >39 mg/dL   VLDL Cholesterol Cal 27 5 - 40 mg/dL   LDL Chol Calc (NIH) 181 (H) 0 - 99 mg/dL  TSH  Result Value Ref Range   TSH 2.990 0.450 - 4.500 uIU/mL  T4, free  Result Value Ref Range   Free T4 1.49 0.82 - 1.77 ng/dL  [id number] 11+Oxyco+Alc+Crt-Bund  Result Value Ref Range   Ethanol Negative Cutoff=0.020 %   Amphetamines, Urine Negative Cutoff=1000 ng/mL   Barbiturate Negative Cutoff=200 ng/mL   BENZODIAZ UR QL Negative Cutoff=200 ng/mL   Cannabinoid Quant, Ur Negative Cutoff=50 ng/mL   Cocaine (Metabolite) Negative Cutoff=300 ng/mL   OPIATE SCREEN URINE Negative Cutoff=300 ng/mL   Oxycodone/Oxymorphone, Urine Negative Cutoff=300 ng/mL   Phencyclidine Negative Cutoff=25 ng/mL   Methadone Screen, Urine Negative Cutoff=300 ng/mL   Propoxyphene Negative Cutoff=300 ng/mL   Meperidine Negative Cutoff=200 ng/mL   Tramadol Negative Cutoff=200 ng/mL   Creatinine 55.8 20.0 - 300.0 mg/dL   pH, Urine 5.5 4.5 - 8.9  VITAMIN D 25 Hydroxy (Vit-D Deficiency, Fractures)  Result Value Ref Range   Vit D, 25-Hydroxy 27.6 (L)  30.0 - 100.0 ng/mL  Vitamin B12  Result Value Ref Range   Vitamin B-12 1,861 (H) 232 - 1,245 pg/mL  Microalbumin, Urine Waived  Result Value Ref Range   Microalb, Ur Waived 10 0 - 19 mg/L   Creatinine, Urine Waived 50 10 - 300 mg/dL   Microalb/Creat Ratio <30 <30 mg/g      Assessment & Plan:   Problem List Items Addressed This Visit       Other   Hyperlipidemia    Chronic, ongoing.  Continue current medication regimen and adjust as needed.  Cardiology started her on Fluvastatin -- will check lipids in 5 weeks (will be at 8 weeks on at that time).       Relevant Medications   fluvastatin (LESCOL) 20 MG capsule   Other Relevant Orders   Lipid Panel w/o Chol/HDL Ratio   Chronic anxiety - Primary    Chronic, ongoing.  She has had improved mood and less migraines with addition of Lexapro -- would like to trial increase to 15 MG, new script sent.  Continue minimal use of Xanax, educated  her at length on risk of long term use -- has been able to cut back on this with Lexapro on board.  Goal with SSRI would be to minimize to discontinue Xanax.  She denies SI/HI.  Return in 6 months.       Relevant Medications   escitalopram (LEXAPRO) 10 MG tablet     Follow up plan: Return in about 6 months (around 12/11/2021) for Anxiety, HTN/HLD, Migraines + needs 5 week lab only visit.

## 2021-06-11 NOTE — Telephone Encounter (Signed)
[facility] called they got 2 prescriptions from [place] for [employer] on the same medicine. Escitalopram they both have the same mg. But one say take 1 1/2 daily one of them say take 1 a day please clarifyThanks

## 2021-06-11 NOTE — Assessment & Plan Note (Signed)
Chronic, ongoing.  Continue current medication regimen and adjust as needed.  Cardiology started her on Fluvastatin -- will check lipids in 5 weeks (will be at 8 weeks on at that time).

## 2021-06-12 NOTE — Telephone Encounter (Signed)
Correct dosage given to pharmacy.

## 2021-07-29 ENCOUNTER — Other Ambulatory Visit: Payer: Self-pay

## 2021-07-29 ENCOUNTER — Ambulatory Visit
Admission: RE | Admit: 2021-07-29 | Discharge: 2021-07-29 | Disposition: A | Discharge status: Home / Self Care | Payer: Medicare PPO | Source: Ambulatory Visit | Attending: Nurse Practitioner | Admitting: Nurse Practitioner

## 2021-07-29 DIAGNOSIS — Z1231 Encounter for screening mammogram for malignant neoplasm of breast: Secondary | ICD-10-CM | POA: Diagnosis present

## 2021-09-14 ENCOUNTER — Encounter: Payer: Self-pay | Admitting: Emergency Medicine

## 2021-09-14 ENCOUNTER — Emergency Department
Admission: EM | Admit: 2021-09-14 | Discharge: 2021-09-14 | Disposition: A | Discharge status: Home / Self Care | Payer: Medicare PPO | Attending: Emergency Medicine | Admitting: Emergency Medicine

## 2021-09-14 ENCOUNTER — Emergency Department: Payer: Medicare PPO

## 2021-09-14 ENCOUNTER — Other Ambulatory Visit: Payer: Self-pay

## 2021-09-14 DIAGNOSIS — Z85828 Personal history of other malignant neoplasm of skin: Secondary | ICD-10-CM | POA: Diagnosis not present

## 2021-09-14 DIAGNOSIS — R001 Bradycardia, unspecified: Secondary | ICD-10-CM | POA: Insufficient documentation

## 2021-09-14 DIAGNOSIS — R5383 Other fatigue: Secondary | ICD-10-CM | POA: Diagnosis present

## 2021-09-14 DIAGNOSIS — Z87891 Personal history of nicotine dependence: Secondary | ICD-10-CM | POA: Insufficient documentation

## 2021-09-14 DIAGNOSIS — Z79899 Other long term (current) drug therapy: Secondary | ICD-10-CM | POA: Insufficient documentation

## 2021-09-14 DIAGNOSIS — E039 Hypothyroidism, unspecified: Secondary | ICD-10-CM | POA: Diagnosis not present

## 2021-09-14 DIAGNOSIS — I10 Essential (primary) hypertension: Secondary | ICD-10-CM | POA: Insufficient documentation

## 2021-09-14 LAB — CBC WITH DIFFERENTIAL/PLATELET
Abs Immature Granulocytes: 0.02 10*3/uL (ref 0.00–0.07)
Basophils Absolute: 0 10*3/uL (ref 0.0–0.1)
Basophils Relative: 0 %
Eosinophils Absolute: 0.1 10*3/uL (ref 0.0–0.5)
Eosinophils Relative: 2 %
HCT: 41.1 % (ref 36.0–46.0)
Hemoglobin: 14 g/dL (ref 12.0–15.0)
Immature Granulocytes: 0 %
Lymphocytes Relative: 23 %
Lymphs Abs: 1.1 10*3/uL (ref 0.7–4.0)
MCH: 32.6 pg (ref 26.0–34.0)
MCHC: 34.1 g/dL (ref 30.0–36.0)
MCV: 95.8 fL (ref 80.0–100.0)
Monocytes Absolute: 0.5 10*3/uL (ref 0.1–1.0)
Monocytes Relative: 10 %
Neutro Abs: 3 10*3/uL (ref 1.7–7.7)
Neutrophils Relative %: 65 %
Platelets: 181 10*3/uL (ref 150–400)
RBC: 4.29 MIL/uL (ref 3.87–5.11)
RDW: 12.2 % (ref 11.5–15.5)
WBC: 4.7 10*3/uL (ref 4.0–10.5)
nRBC: 0 % (ref 0.0–0.2)

## 2021-09-14 LAB — COMPREHENSIVE METABOLIC PANEL
ALT: 11 U/L (ref 0–44)
AST: 16 U/L (ref 15–41)
Albumin: 4.1 g/dL (ref 3.5–5.0)
Alkaline Phosphatase: 53 U/L (ref 38–126)
Anion gap: 7 (ref 5–15)
BUN: 10 mg/dL (ref 8–23)
CO2: 29 mmol/L (ref 22–32)
Calcium: 8.9 mg/dL (ref 8.9–10.3)
Chloride: 101 mmol/L (ref 98–111)
Creatinine, Ser: 0.56 mg/dL (ref 0.44–1.00)
GFR, Estimated: >60 mL/min (ref >60)
Glucose, Bld: 111 mg/dL — ABNORMAL HIGH (ref 70–99)
Potassium: 3.7 mmol/L (ref 3.5–5.1)
Sodium: 137 mmol/L (ref 135–145)
Total Bilirubin: 0.9 mg/dL (ref 0.3–1.2)
Total Protein: 6.8 g/dL (ref 6.5–8.1)

## 2021-09-14 LAB — TROPONIN I (HIGH SENSITIVITY): Troponin I (High Sensitivity): 2 ng/L (ref <18)

## 2021-09-14 NOTE — Discharge Instructions (Addendum)
Please hold to metoprolol for 1 day and take 2 blood pressure readings in the morning and in the evening tomorrow as well as follow-up with Dr. Alveria Apley office for a Holter monitor

## 2021-09-14 NOTE — ED Provider Notes (Signed)
Emergency Medicine Provider Triage Evaluation Note  Savannah [name] , a 75 y.o. female  was evaluated in triage.  Pt complains of irregular heart rate, recent COVID x5 days, no chest pain.  Review of Systems  Positive: Irregular heart rate, lightheadedness Negative: Chest pain, shortness of breath  Physical Exam  Ht 5\' 6"  (1.676 m)   Wt 67.3 kg   LMP  (LMP Unknown)   BMI 23.95 kg/m  Gen:   Awake, no distress   Resp:  Normal effort  MSK:   Moves extremities without difficulty  Other:  Irregular heart rate noted on auscultation  Medical Decision Making  Medically screening exam initiated at 2:30 PM.  Appropriate orders placed.  [name] was informed that the remainder of the evaluation will be completed by another provider, this initial triage assessment does not replace that evaluation, and the importance of remaining in the ED until their evaluation is complete.     Versie Starks, PA-C 09/14/21 1459    Lucrezia Starch, MD 09/14/21 [phone]

## 2021-09-14 NOTE — ED Triage Notes (Signed)
Pt was diagnosised with COVID on Wednesday, she has been feeling fatigued, headache, she took her B/P and pulse at home and it read low. She is on Metoprolol, is not having any chest pain or SHOB. She is concerned about her HR reading low.   She also was outside Thursday and fell and caught her self with her left wrist. She did not go anywhere because of the fall because she has COVID, she would like her wrist checked out also.

## 2021-09-14 NOTE — ED Provider Notes (Signed)
[facility] Emergency Department Provider Note   ____________________________________________   Event Date/Time   First MD Initiated Contact with Patient 09/14/21 1731     (approximate)  I have reviewed the triage vital signs and the nursing notes.   HISTORY  Chief Complaint Fatigue    HPI Savannah [name] is a 75 y.o. female who presents for generalized fatigue  LOCATION: General DURATION: 1 week prior to arrival TIMING: Stable since onset SEVERITY: Moderate QUALITY: Fatigue CONTEXT: Patient states that she was diagnosed with COVID approximately 2 weeks ago and since that time has had fatigue that she states is associated with low heart rate when she used a pulse ox this morning that read low.  Patient states that she took her pulse and noticed it to be very slow and irregular MODIFYING FACTORS: Denies any exacerbating or relieving factors. 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   HOH (hard of hearing)    WEARS AIDS   Hyperlipidemia    Hypertension    CONTROLLED ON MEDS   Hypothyroidism    IBS (irritable bowel syndrome)    Migraine    MVP (mitral valve prolapse)    OA (osteoarthritis) of neck    JOINTS   Osteoporosis    Thyroid disease     Patient Active Problem List   Diagnosis Date Noted   Chronic daily headache 05/19/2021   Intractable migraine with aura without status migrainosus 05/19/2021   Myalgia due to statin 05/15/2021   Long term prescription benzodiazepine use 05/14/2021   Vitamin B12 deficiency 05/09/2021   Vitamin D deficiency 05/09/2021   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/2020   Aortic atherosclerosis ([place]) 01/02/2020   Chronic sphenoidal sinusitis 11/02/2017   GERD (gastroesophageal reflux disease) 04/28/2017   Advance care planning 04/15/2017   Family  history of colon cancer 04/13/2016   Chronic anxiety 04/13/2016   Hypothyroidism 09/15/2015   MVP (mitral valve prolapse) 09/15/2015   OA (osteoarthritis) of neck 09/15/2015   Hyperlipidemia 09/15/2015   Essential hypertension 09/15/2015   Migraines 09/15/2015   Age-related osteoporosis without current pathological fracture 09/15/2015    Past Surgical History:  Procedure Laterality Date   ANKLE FRACTURE SURGERY Left    BREAST CYST ASPIRATION Right 90s   COLONOSCOPY WITH PROPOFOL N/A 04/12/2016   Procedure: COLONOSCOPY WITH PROPOFOL;  Surgeon: Manya Silvas, MD;  Location: [facility] ENDOSCOPY;  Service: Endoscopy;  Laterality: N/A;   FRACTURE SURGERY     ANKLE   HAMMER TOE SURGERY Right 02/08/2018   Procedure: HAMMER TOE CORRECTION-5TH TOE, Excision soft tissue mass dorsal lateral right fifth toe and exostectomy dorsal lateral DIPJ fifth toe;  Surgeon: Samara Deist, DPM;  Location: [place];  Service: Podiatry;  Laterality: Right;  IVA LOCAL   moses surgery  09/2015   SINUS SURGERY WITH INSTATRAK  10/2017   DEVIATED SEPTUM   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 Authorizing Provider  ALPRAZolam (XANAX) 0.25 MG tablet Take 1 tablet (0.25 mg total) by mouth at bedtime as needed for anxiety. 05/14/21   Cannady, Henrine Screws T, NP  clobetasol ointment (TEMOVATE) 7.25 % Apply 1 application topically 2 (two) times daily.  [provider]  cyanocobalamin (,VITAMIN B-12,) 1000 MCG/ML injection Inject int muscle once a week for 3 weeks then once a month for 4 months 02/25/21   [provider]  escitalopram (LEXAPRO) 10 MG tablet Take 1.5 tablets (15 mg total) by mouth daily. 06/11/21   Cannady, Henrine Screws T, NP  fluvastatin (LESCOL) 20 MG capsule Take 20 mg by mouth at bedtime.    [provider]  ibandronate (BONIVA) 150 MG tablet Take in the morning with a full glass of water,  on an empty stomach, and do not take anything else by mouth or lie down for the next 30 min. 05/14/21   Marnee Guarneri T, NP  levothyroxine (SYNTHROID) 75 MCG tablet Take 1 tablet (75 mcg total) by mouth daily before breakfast. 05/14/21   Cannady, Henrine Screws T, NP  metoprolol succinate (TOPROL-XL) 50 MG 24 hr tablet Take with or immediately following a meal.Take 1/2 tablet (25 mg) daily with or immediately following a meal. 05/14/21   Cannady, Jolene T, NP  scopolamine (TRANSDERM-SCOP, 1.5 MG,) 1 MG/3DAYS Place 1 patch (1.5 mg total) onto the skin every 3 (three) days. 05/15/21   Venita Lick, NP    Allergies Amoxil [amoxicillin] and Penicillins  Family History  Problem Relation Age of Onset   Hypertension Mother    Colon cancer Mother    Lung cancer Father    Brain cancer Father    Hyperlipidemia Brother    Breast cancer Neg Hx     Social History Social History   Tobacco Use   Smoking status: Former    Packs/day: 1.00    Years: 4.00    Pack years: 4.00    Types: Cigarettes    Quit date: 1970    Years since quitting: 52.7   Smokeless tobacco: Never  Vaping Use   Vaping Use: Never used  Substance Use Topics   Alcohol use: Yes    Alcohol/week: 10.0 standard drinks    Types: 10 Glasses of wine per week   Drug use: No    Review of Systems Constitutional: No fever/chills Eyes: No visual changes. ENT: No sore throat. Cardiovascular: Denies chest pain. Respiratory: Denies shortness of breath. Gastrointestinal: No abdominal pain.  No nausea, no vomiting.  No diarrhea. Genitourinary: Negative for dysuria. Musculoskeletal: Negative for acute arthralgias Skin: Negative for rash. Neurological: Negative for headaches, weakness/numbness/paresthesias in any extremity Psychiatric: Negative for suicidal ideation/homicidal ideation   ____________________________________________   PHYSICAL EXAM:  VITAL SIGNS: ED Triage Vitals  Enc Vitals Group     BP 09/14/21 1547 121/69      Pulse Rate 09/14/21 1547 81     Resp 09/14/21 1547 20     Temp 09/14/21 1547 98.2 F (36.8 C)     Temp Source 09/14/21 1547 Oral     SpO2 09/14/21 1547 100 %     Weight 09/14/21 1418 148 lb 5.9 oz (67.3 kg)     Height 09/14/21 1418 5\' 6"  (1.676 m)     Head Circumference --      Peak Flow --      Pain Score 09/14/21 1417 3     Pain Loc --      Pain Edu? --      Excl. in [place]? --    Constitutional: Alert and oriented. Well appearing and in no acute distress. Eyes: Conjunctivae are normal. PERRL. Head: Atraumatic. Nose: No congestion/rhinnorhea. Mouth/Throat: Mucous membranes are moist. Neck: No stridor Cardiovascular: Grossly normal heart sounds.  Good peripheral circulation.  Respiratory: Normal respiratory effort.  No retractions. Gastrointestinal: Soft and nontender. No distention. Musculoskeletal: Tenderness palpation over the left distal wrist without any point tenderness no obvious deformities Neurologic:  Normal speech and language. No gross focal neurologic deficits are appreciated. Skin:  Skin is warm and dry. No rash noted. Psychiatric: Mood and affect are normal. Speech and behavior are normal.  ____________________________________________   LABS (all labs ordered are listed, but only abnormal results are displayed)  Labs Reviewed  COMPREHENSIVE METABOLIC PANEL - Abnormal; Notable for the following components:      Result Value   Glucose, Bld 111 (*)    All other components within normal limits  CBC WITH DIFFERENTIAL/PLATELET  URINALYSIS, COMPLETE (UACMP) WITH MICROSCOPIC  TROPONIN I (HIGH SENSITIVITY)  TROPONIN I (HIGH SENSITIVITY)   ____________________________________________  EKG  ED ECG REPORT I, Naaman Plummer, the attending physician, personally viewed and interpreted this ECG.  Date: 09/14/2021 EKG Time: 1415 Rate: 69 Rhythm: normal sinus rhythm QRS Axis: normal Intervals: normal ST/T Wave abnormalities: normal Narrative Interpretation: no  evidence of acute ischemia  ____________________________________________  RADIOLOGY  ED MD interpretation: 2 view chest x-ray shows no evidence of acute abnormalities including no pneumonia, pneumothorax, or widened mediastinum  Three-view x-ray of the left wrist shows possible subtle acute nondisplaced intra-articular distal radius fracture  Official radiology report(s): DG Chest 2 View  Result Date: 09/14/2021 CLINICAL DATA:  COVID positive fatigue EXAM: CHEST - 2 VIEW COMPARISON:  08/27/2020 FINDINGS: The heart size and mediastinal contours are within normal limits. Both lungs are clear. Degenerative changes of the spine. IMPRESSION: No active cardiopulmonary disease. Electronically Signed   By: Donavan Foil M.D.   On: 09/14/2021 15:15   DG Wrist Complete Left  Result Date: 09/14/2021 CLINICAL DATA:  COVID fall EXAM: LEFT WRIST - COMPLETE 3+ VIEW COMPARISON:  10/07/2017 FINDINGS: Suspicion of subtle acute nondisplaced intra-articular distal radius fracture. No malalignment. Moderate degenerative changes at the first [facility] joint and STT interval. Moderate degenerative changes at the fifth [facility] joint. IMPRESSION: Suspected acute subtle nondisplaced intra-articular distal radius fracture Electronically Signed   By: Donavan Foil M.D.   On: 09/14/2021 15:18    ____________________________________________   PROCEDURES  Procedure(s) performed (including Critical Care):  .1-3 Lead EKG Interpretation Performed by: Naaman Plummer, MD Authorized by: Naaman Plummer, MD     Interpretation: normal     ECG rate:  75   ECG rate assessment: normal     Rhythm: sinus rhythm     Ectopy: none     Conduction: normal     ____________________________________________   INITIAL IMPRESSION / ASSESSMENT AND PLAN / ED COURSE  As part of my medical decision making, I reviewed the following data within the electronic medical record, if available:  Nursing notes reviewed and incorporated, Labs  reviewed, EKG interpreted, Old chart reviewed, Radiograph reviewed and Notes from prior ED visits reviewed and incorporated        Patient presents with complaints of fatigue ED Workup:  CBC, BMP, Troponin, BNP, ECG, CXR Differential diagnosis includes HF, ICH, seizure, stroke, HOCM, ACS, aortic dissection, malignant arrhythmia, or GI bleed. Findings: No evidence of acute laboratory abnormalities.  Troponin negative x1 EKG: No e/o STEMI. No evidence of Brugadas sign, delta wave, epsilon wave, significantly prolonged QTc, or malignant arrhythmia.  Disposition: Discharge. Patient is at baseline at this time. Return precautions expressed and understood in person. Advised follow up with primary care provider or clinic physician in next 24 hours.  ____________________________________________   FINAL CLINICAL IMPRESSION(S) / ED DIAGNOSES  Final diagnoses:  Fatigue, unspecified type  Bradycardia, unspecified     ED Discharge Orders     None        Note:  This document was prepared using Dragon voice recognition software and may include unintentional dictation errors.    Naaman Plummer, MD 09/14/21 2127

## 2021-09-18 ENCOUNTER — Telehealth: Payer: Self-pay | Admitting: Nurse Practitioner

## 2021-09-18 NOTE — Telephone Encounter (Signed)
-----   Message from [name] sent at 09/15/2021 11:25 AM EDT ----- Called pt and lmom for pt to call the office to get scheduled.  Savannah [name] ----- Message ----- From: Venita Lick, NP Sent: 09/15/2021   5:49 AM EDT To: [facility] Admin  Needs fatigue follow-up -- ER follow-up and mood

## 2021-09-18 NOTE — Telephone Encounter (Signed)
Called patient to schedule ER f/u.  Patient stated she followed up with her heart doctor and does not need to f/u with her pcp at this time.

## 2021-10-01 ENCOUNTER — Ambulatory Visit: Payer: Medicare PPO | Admitting: Nurse Practitioner

## 2021-10-01 ENCOUNTER — Encounter: Payer: Self-pay | Admitting: Nurse Practitioner

## 2021-10-01 ENCOUNTER — Ambulatory Visit
Admission: RE | Admit: 2021-10-01 | Discharge: 2021-10-01 | Disposition: A | Discharge status: Home / Self Care | Payer: Medicare PPO | Source: Ambulatory Visit | Attending: Nurse Practitioner | Admitting: Nurse Practitioner

## 2021-10-01 ENCOUNTER — Other Ambulatory Visit: Payer: Self-pay

## 2021-10-01 ENCOUNTER — Ambulatory Visit
Admission: RE | Admit: 2021-10-01 | Discharge: 2021-10-01 | Disposition: A | Discharge status: Home / Self Care | Payer: Medicare PPO | Attending: Nurse Practitioner | Admitting: Nurse Practitioner

## 2021-10-01 VITALS — BP 146/76 | HR 75 | Temp 98.4°F | Resp 18 | Wt 149.4 lb

## 2021-10-01 DIAGNOSIS — E538 Deficiency of other specified B group vitamins: Secondary | ICD-10-CM

## 2021-10-01 DIAGNOSIS — G43509 Persistent migraine aura without cerebral infarction, not intractable, without status migrainosus: Secondary | ICD-10-CM

## 2021-10-01 DIAGNOSIS — S62102S Fracture of unspecified carpal bone, left wrist, sequela: Secondary | ICD-10-CM

## 2021-10-01 DIAGNOSIS — M25512 Pain in left shoulder: Secondary | ICD-10-CM | POA: Insufficient documentation

## 2021-10-01 DIAGNOSIS — E559 Vitamin D deficiency, unspecified: Secondary | ICD-10-CM | POA: Diagnosis not present

## 2021-10-01 DIAGNOSIS — E7800 Pure hypercholesterolemia, unspecified: Secondary | ICD-10-CM

## 2021-10-01 MED ORDER — PREDNISONE 10 MG PO TABS: ORAL_TABLET | ORAL | 0 refills | Status: DC

## 2021-10-01 MED ORDER — NAPROXEN 500 MG PO TABS: 500.0000 mg | ORAL_TABLET | Freq: Two times a day (BID) | ORAL | 0 refills | Status: DC

## 2021-10-01 NOTE — Assessment & Plan Note (Signed)
Started several months ago on fluvastatin. She has tolerated this with no muscle cramping or pain. Will check lipid panel today.

## 2021-10-01 NOTE — Assessment & Plan Note (Signed)
Chronic, headaches controlled with lexapro, however she has had diarrhea continuous since starting the lexapro. She would like to stop it to see if the diarrhea resolves and/or her migraines return. She can take 10mg  daily for 1 week, then 5mg  daily for 1 week, then stop. Follow up in 1 month.

## 2021-10-01 NOTE — Progress Notes (Signed)
EKG interpreted by me on 10/01/21 showed normal sinus rhythm with a heart rate of 68. No ST or T wave changes.

## 2021-10-01 NOTE — Progress Notes (Signed)
Acute Office Visit  Subjective:    Patient ID: Savannah [name], female    DOB: [birth year], 75 y.o.   MRN: [id number]  Chief Complaint  Patient presents with   Shoulder Pain    Left shoulder pain that radiates down her left arm and up to the back of her neck. She is thinking it could be nerve pain, but wants to make sure it's not her heart.     HPI Patient is in today for left shoulder pain that radiates down her left arm for about a week. She was recently in [place] and the pain started then. Also, she states that on 09/14/21 she had an x-ray of her left wrist which showed a non-displaced fracture. She has been wearing a wrist brace. She is still having pain in her left wrist.   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   HOH (hard of hearing)    WEARS AIDS   Hyperlipidemia    Hypertension    CONTROLLED ON MEDS   Hypothyroidism    IBS (irritable bowel syndrome)    Migraine    MVP (mitral valve prolapse)    OA (osteoarthritis) of neck    JOINTS   Osteoporosis    Thyroid disease     Past Surgical History:  Procedure Laterality Date   ANKLE FRACTURE SURGERY Left    BREAST CYST ASPIRATION Right 90s   COLONOSCOPY WITH PROPOFOL N/A 04/12/2016   Procedure: COLONOSCOPY WITH PROPOFOL;  Surgeon: Manya Silvas, MD;  Location: [facility] ENDOSCOPY;  Service: Endoscopy;  Laterality: N/A;   FRACTURE SURGERY     ANKLE   HAMMER TOE  SURGERY Right 02/08/2018   Procedure: HAMMER TOE CORRECTION-5TH TOE, Excision soft tissue mass dorsal lateral right fifth toe and exostectomy dorsal lateral DIPJ fifth toe;  Surgeon: Samara Deist, DPM;  Location: [place];  Service: Podiatry;  Laterality: Right;  IVA LOCAL   moses surgery  09/2015   SINUS SURGERY WITH INSTATRAK  10/2017   DEVIATED SEPTUM   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    Family History  Problem Relation Age of Onset   Hypertension Mother    Colon cancer Mother    Lung cancer Father    Brain cancer Father    Hyperlipidemia Brother    Breast cancer Neg Hx     Social History   Socioeconomic History   Marital status: Married    Spouse name: Not on file   Number of children: Not on file   Years of education: Not on file   Highest education level: Master's degree (e.g., MA, MS, MEng, MEd, MSW, MBA)  Occupational History   Occupation: retired  Tobacco Use   Smoking status: Former    Packs/day: 1.00    Years: 4.00    Pack years: 4.00    Types: Cigarettes    Quit date: 1970    Years since quitting: 52.8   Smokeless tobacco:  Never  Vaping Use   Vaping Use: Never used  Substance and Sexual Activity   Alcohol use: Yes    Alcohol/week: 10.0 standard drinks    Types: 10 Glasses of wine per week   Drug use: No   Sexual activity: Yes    Birth control/protection: None    Comment: Married  Other Topics Concern   Not on file  Social History Narrative   Not on file   Social Determinants of Health   Financial Resource Strain: Low Risk    Difficulty of Paying Living Expenses: Not hard at all  Food Insecurity: No Food Insecurity   Worried About Charity fundraiser in the Last Year: Never true   Arboriculturist in the Last Year: Never true  Transportation Needs: No Transportation Needs   Lack of Transportation (Medical): No   Lack of Transportation (Non-Medical): No  Physical Activity: Inactive   Days of Exercise per Week: 0  days   Minutes of Exercise per Session: 0 min  Stress: No Stress Concern Present   Feeling of Stress : Not at all  Social Connections: Not on file  Intimate Partner Violence: Not on file    Outpatient Medications Prior to Visit  Medication Sig Dispense Refill   ALPRAZolam (XANAX) 0.25 MG tablet Take 1 tablet (0.25 mg total) by mouth at bedtime as needed for anxiety. 30 tablet 0   cholecalciferol (VITAMIN D3) 25 MCG (1000 UNIT) tablet Take 1,000 Units by mouth daily.     clobetasol ointment (TEMOVATE) 1.61 % Apply 1 application topically 2 (two) times daily.     escitalopram (LEXAPRO) 10 MG tablet Take 1.5 tablets (15 mg total) by mouth daily. 135 tablet 4   fluvastatin (LESCOL) 20 MG capsule Take 20 mg by mouth at bedtime.     ibandronate (BONIVA) 150 MG tablet Take in the morning with a full glass of water, on an empty stomach, and do not take anything else by mouth or lie down for the next 30 min. 3 tablet 4   levothyroxine (SYNTHROID) 75 MCG tablet Take 1 tablet (75 mcg total) by mouth daily before breakfast. 90 tablet 4   metoprolol succinate (TOPROL-XL) 50 MG 24 hr tablet Take with or immediately following a meal.Take 1/2 tablet (25 mg) daily with or immediately following a meal. 45 tablet 4   scopolamine (TRANSDERM-SCOP, 1.5 MG,) 1 MG/3DAYS Place 1 patch (1.5 mg total) onto the skin every 3 (three) days. 6 patch 0   vitamin B-12 (CYANOCOBALAMIN) 1000 MCG tablet Take 1,000 mcg by mouth daily.     cyanocobalamin (,VITAMIN B-12,) 1000 MCG/ML injection Inject int muscle once a week for 3 weeks then once a month for 4 months (Patient not taking: Reported on 10/01/2021)     No facility-administered medications prior to visit.    Allergies  Allergen Reactions   Amoxil [Amoxicillin] Itching   Penicillins Other (See Comments)    "passed out"    Review of Systems  Constitutional: Negative.   Respiratory: Negative.    Cardiovascular: Negative.   Gastrointestinal:  Positive for  diarrhea. Negative for abdominal pain, nausea and vomiting.  Musculoskeletal:  Positive for arthralgias (left shoulder, left wrist).  Skin: Negative.   Neurological: Negative.       Objective:    Physical Exam Vitals and nursing note reviewed.  Constitutional:      General: She is not in acute distress.    Appearance: Normal appearance.  HENT:     Head:  Normocephalic.  Eyes:     Conjunctiva/sclera: Conjunctivae normal.  Cardiovascular:     Rate and Rhythm: Normal rate. Rhythm irregular.     Pulses: Normal pulses.     Heart sounds: Normal heart sounds.  Pulmonary:     Effort: Pulmonary effort is normal.     Breath sounds: Normal breath sounds.  Musculoskeletal:        General: Swelling (left wrist) and tenderness (left posterior shoulder) present.     Cervical back: Normal range of motion.  Skin:    General: Skin is warm.  Neurological:     General: No focal deficit present.     Mental Status: She is alert and oriented to person, place, and time.  Psychiatric:        Mood and Affect: Mood normal.        Behavior: Behavior normal.        Thought Content: Thought content normal.        Judgment: Judgment normal.    BP (!) 146/76 (BP Location: Left Arm, Patient Position: Sitting)   Pulse 75   Temp 98.4 F (36.9 C) (Oral)   Resp 18   Wt 149 lb 6.4 oz (67.8 kg)   LMP  (LMP Unknown)   SpO2 98%   BMI 24.11 kg/m  Wt Readings from Last 3 Encounters:  10/01/21 149 lb 6.4 oz (67.8 kg)  09/14/21 148 lb 5.9 oz (67.3 kg)  06/11/21 148 lb 6.4 oz (67.3 kg)    Health Maintenance Due  Topic Date Due   Zoster Vaccines- Shingrix (1 of 2) Never done   COVID-19 Vaccine (4 - Booster for Moderna series) 11/03/2020   INFLUENZA VACCINE  07/20/2021    There are no preventive care reminders to display for this patient.   Lab Results  Component Value Date   TSH 2.990 05/14/2021   Lab Results  Component Value Date   WBC 4.7 09/14/2021   HGB 14.0 09/14/2021   HCT 41.1  09/14/2021   MCV 95.8 09/14/2021   PLT 181 09/14/2021   Lab Results  Component Value Date   NA 137 09/14/2021   K 3.7 09/14/2021   CO2 29 09/14/2021   GLUCOSE 111 (H) 09/14/2021   BUN 10 09/14/2021   CREATININE 0.56 09/14/2021   BILITOT 0.9 09/14/2021   ALKPHOS 53 09/14/2021   AST 16 09/14/2021   ALT 11 09/14/2021   PROT 6.8 09/14/2021   ALBUMIN 4.1 09/14/2021   CALCIUM 8.9 09/14/2021   ANIONGAP 7 09/14/2021   EGFR 76 05/14/2021   Lab Results  Component Value Date   CHOL 280 (H) 05/14/2021   Lab Results  Component Value Date   HDL 72 05/14/2021   Lab Results  Component Value Date   LDLCALC 181 (H) 05/14/2021   Lab Results  Component Value Date   TRIG 150 (H) 05/14/2021   No results found for: CHOLHDL No results found for: HGBA1C     Assessment & Plan:   Problem List Items Addressed This Visit       Cardiovascular and Mediastinum   Migraines    Chronic, headaches controlled with lexapro, however she has had diarrhea continuous since starting the lexapro. She would like to stop it to see if the diarrhea resolves and/or her migraines return. She can take 4m daily for 1 week, then 572mdaily for 1 week, then stop. Follow up in 1 month.      Relevant Medications   naproxen (NAPROSYN) 500 MG tablet  Other   Hyperlipidemia    Started several months ago on fluvastatin. She has tolerated this with no muscle cramping or pain. Will check lipid panel today.       Relevant Orders   Lipid Panel w/o Chol/HDL Ratio   Vitamin B12 deficiency    She was getting vitamin B12 injections, and now she is taking oral vitamin B12 supplements daily. Check B12 today to see if oral supplements controlling her deficiency.       Relevant Orders   Vitamin B12   Vitamin D deficiency    Taking OTC vitamin D3 supplement. Will check vitamin D levels today and adjust regimen as needed.       Relevant Orders   Vitamin D (25 hydroxy)   Acute pain of left shoulder - Primary     Acute x1 week. Pain is a burning pain which radiates down her left arm and up to her neck. She has taken tylenol with mild relief. She has concerns that it could be her heart with the pain going down her left arm. EKG obtained which showed normal sinus rhythm with no ST or T wave changes. Pain most likely cervical radiculopathy. Will check x-ray of cervical spine. Will start prednisone taper and naproxen twice a day. Exercises printed for patient. Can continue tylenol, ice, and heat. Follow up in 4 weeks.       Relevant Orders   EKG 12-Lead (Completed)   DG Cervical Spine Complete   Other Visit Diagnoses     Closed fracture of left wrist, sequela       Discussed RICE. Continue wearing brace. She sees Dr. Sabra Heck with ortho, encouraged to set up an appointment with him to f/u on fracture        Meds ordered this encounter  Medications   predniSONE (DELTASONE) 10 MG tablet    Sig: Take 6 tablets today and tomorrow, then 5 tablets the next 2 days, then decrease by 1 tablet every other day until gone    Dispense:  42 tablet    Refill:  0   naproxen (NAPROSYN) 500 MG tablet    Sig: Take 1 tablet (500 mg total) by mouth 2 (two) times daily with a meal.    Dispense:  30 tablet    Refill:  0      Charyl Dancer, NP

## 2021-10-01 NOTE — Patient Instructions (Signed)
Decrease lexapro to 1 tablet daily for 1 week, then 0.5 tablet daily for 1 week then stop Let us know if diarrhea is improving and how headaches are doing  Start prednisone taper  Naproxen twice a day with food  We will get an x-ray of your neck at [facility]: [address], [place], [place] [address]  Call Dr. Sabra Heck with orthopedics to see about further recommendations for your wrist fracture RICE: Rest, ice, compression, and elevation

## 2021-10-01 NOTE — Assessment & Plan Note (Signed)
Acute x1 week. Pain is a burning pain which radiates down her left arm and up to her neck. She has taken tylenol with mild relief. She has concerns that it could be her heart with the pain going down her left arm. EKG obtained which showed normal sinus rhythm with no ST or T wave changes. Pain most likely cervical radiculopathy. Will check x-ray of cervical spine. Will start prednisone taper and naproxen twice a day. Exercises printed for patient. Can continue tylenol, ice, and heat. Follow up in 4 weeks.

## 2021-10-01 NOTE — Assessment & Plan Note (Signed)
She was getting vitamin B12 injections, and now she is taking oral vitamin B12 supplements daily. Check B12 today to see if oral supplements controlling her deficiency.

## 2021-10-01 NOTE — Assessment & Plan Note (Signed)
Taking OTC vitamin D3 supplement. Will check vitamin D levels today and adjust regimen as needed.

## 2021-10-02 LAB — LIPID PANEL W/O CHOL/HDL RATIO
Cholesterol, Total: 253 mg/dL — ABNORMAL HIGH (ref 100–199)
HDL: 66 mg/dL (ref >39)
LDL Chol Calc (NIH): 143 mg/dL — ABNORMAL HIGH (ref 0–99)
Triglycerides: 248 mg/dL — ABNORMAL HIGH (ref 0–149)
VLDL Cholesterol Cal: 44 mg/dL — ABNORMAL HIGH (ref 5–40)

## 2021-10-02 LAB — VITAMIN B12: Vitamin B-12: 738 pg/mL (ref 232–1245)

## 2021-10-02 LAB — VITAMIN D 25 HYDROXY (VIT D DEFICIENCY, FRACTURES): Vit D, 25-Hydroxy: 37.4 ng/mL (ref 30.0–100.0)

## 2021-10-02 MED ORDER — ATORVASTATIN CALCIUM 20 MG PO TABS: 20.0000 mg | ORAL_TABLET | Freq: Every day | ORAL | 1 refills | Status: DC

## 2021-10-09 MED ORDER — GABAPENTIN 100 MG PO CAPS: 100.0000 mg | ORAL_CAPSULE | Freq: Every day | ORAL | 0 refills | Status: DC

## 2021-11-04 ENCOUNTER — Ambulatory Visit: Payer: Medicare PPO | Admitting: Nurse Practitioner

## 2022-04-05 ENCOUNTER — Other Ambulatory Visit: Payer: Self-pay | Admitting: Nurse Practitioner

## 2022-04-16 DIAGNOSIS — L309 Dermatitis, unspecified: Secondary | ICD-10-CM | POA: Diagnosis not present

## 2022-04-16 DIAGNOSIS — D2261 Melanocytic nevi of right upper limb, including shoulder: Secondary | ICD-10-CM | POA: Diagnosis not present

## 2022-04-16 DIAGNOSIS — L821 Other seborrheic keratosis: Secondary | ICD-10-CM | POA: Diagnosis not present

## 2022-04-16 DIAGNOSIS — L570 Actinic keratosis: Secondary | ICD-10-CM | POA: Diagnosis not present

## 2022-04-16 DIAGNOSIS — Z85828 Personal history of other malignant neoplasm of skin: Secondary | ICD-10-CM | POA: Diagnosis not present

## 2022-04-16 DIAGNOSIS — D225 Melanocytic nevi of trunk: Secondary | ICD-10-CM | POA: Diagnosis not present

## 2022-05-21 ENCOUNTER — Ambulatory Visit: Payer: Medicare PPO

## 2022-06-14 ENCOUNTER — Encounter: Payer: Self-pay | Admitting: Nurse Practitioner

## 2022-06-14 ENCOUNTER — Ambulatory Visit (INDEPENDENT_AMBULATORY_CARE_PROVIDER_SITE_OTHER): Payer: Medicare PPO | Admitting: Nurse Practitioner

## 2022-06-14 VITALS — BP 127/86 | HR 72 | Temp 97.8°F | Ht 65.5 in | Wt 149.8 lb

## 2022-06-14 DIAGNOSIS — M791 Myalgia, unspecified site: Secondary | ICD-10-CM

## 2022-06-14 DIAGNOSIS — F419 Anxiety disorder, unspecified: Secondary | ICD-10-CM | POA: Diagnosis not present

## 2022-06-14 DIAGNOSIS — M810 Age-related osteoporosis without current pathological fracture: Secondary | ICD-10-CM | POA: Diagnosis not present

## 2022-06-14 DIAGNOSIS — I341 Nonrheumatic mitral (valve) prolapse: Secondary | ICD-10-CM

## 2022-06-14 DIAGNOSIS — I493 Ventricular premature depolarization: Secondary | ICD-10-CM | POA: Diagnosis not present

## 2022-06-14 DIAGNOSIS — I10 Essential (primary) hypertension: Secondary | ICD-10-CM | POA: Diagnosis not present

## 2022-06-14 DIAGNOSIS — E7800 Pure hypercholesterolemia, unspecified: Secondary | ICD-10-CM | POA: Diagnosis not present

## 2022-06-14 DIAGNOSIS — E039 Hypothyroidism, unspecified: Secondary | ICD-10-CM

## 2022-06-14 DIAGNOSIS — G43119 Migraine with aura, intractable, without status migrainosus: Secondary | ICD-10-CM | POA: Diagnosis not present

## 2022-06-14 DIAGNOSIS — Z1231 Encounter for screening mammogram for malignant neoplasm of breast: Secondary | ICD-10-CM | POA: Diagnosis not present

## 2022-06-14 DIAGNOSIS — E559 Vitamin D deficiency, unspecified: Secondary | ICD-10-CM

## 2022-06-14 DIAGNOSIS — Z0000 Encounter for general adult medical examination without abnormal findings: Secondary | ICD-10-CM

## 2022-06-14 DIAGNOSIS — E538 Deficiency of other specified B group vitamins: Secondary | ICD-10-CM | POA: Diagnosis not present

## 2022-06-14 DIAGNOSIS — Z79899 Other long term (current) drug therapy: Secondary | ICD-10-CM

## 2022-06-14 DIAGNOSIS — T466X5A Adverse effect of antihyperlipidemic and antiarteriosclerotic drugs, initial encounter: Secondary | ICD-10-CM

## 2022-06-14 DIAGNOSIS — I700 Atherosclerosis of aorta: Secondary | ICD-10-CM | POA: Diagnosis not present

## 2022-06-14 MED ORDER — ATORVASTATIN CALCIUM 20 MG PO TABS
20.0000 mg | ORAL_TABLET | Freq: Every day | ORAL | 0 refills | Status: DC
Start: 2022-06-14 — End: 2022-10-07

## 2022-06-14 MED ORDER — IBANDRONATE SODIUM 150 MG PO TABS: 150.0000 mg | ORAL_TABLET | ORAL | 0 refills | Status: DC

## 2022-06-14 MED ORDER — LEVOTHYROXINE SODIUM 75 MCG PO TABS: 75.0000 ug | ORAL_TABLET | Freq: Every day | ORAL | 4 refills | Status: DC

## 2022-06-14 MED ORDER — ALPRAZOLAM 0.25 MG PO TABS: 0.2500 mg | ORAL_TABLET | Freq: Every evening | ORAL | 0 refills | Status: DC | PRN

## 2022-06-14 MED ORDER — METOPROLOL SUCCINATE ER 50 MG PO TB24: ORAL_TABLET | ORAL | 4 refills | Status: DC

## 2022-06-14 NOTE — Assessment & Plan Note (Signed)
Noted on imaging 03/17/20. Continue on Atorvastatin as prescribed by cardiology.

## 2022-06-14 NOTE — Assessment & Plan Note (Signed)
Chronic, ongoing. Check B12 level today.

## 2022-06-14 NOTE — Assessment & Plan Note (Signed)
Tolerating Atorvastatin at this time, continue this and check Lipid panel today.

## 2022-06-14 NOTE — Assessment & Plan Note (Signed)
Continue collaboration with cardiology.  

## 2022-06-14 NOTE — Assessment & Plan Note (Signed)
Continue collaboration with neurology. Migraines improved since injection by neurology.

## 2022-06-15 ENCOUNTER — Encounter: Payer: Self-pay | Admitting: Nurse Practitioner

## 2022-06-15 LAB — CBC WITH DIFFERENTIAL/PLATELET
Basophils Absolute: 0 10*3/uL (ref 0.0–0.2)
Basos: 0 %
EOS (ABSOLUTE): 0.1 10*3/uL (ref 0.0–0.4)
Eos: 2 %
Hematocrit: 41 % (ref 34.0–46.6)
Hemoglobin: 14 g/dL (ref 11.1–15.9)
Immature Grans (Abs): 0 10*3/uL (ref 0.0–0.1)
Immature Granulocytes: 0 %
Lymphocytes Absolute: 1.7 10*3/uL (ref 0.7–3.1)
Lymphs: 29 %
MCH: 32.2 pg (ref 26.6–33.0)
MCHC: 34.1 g/dL (ref 31.5–35.7)
MCV: 94 fL (ref 79–97)
Monocytes Absolute: 0.6 10*3/uL (ref 0.1–0.9)
Monocytes: 11 %
Neutrophils Absolute: 3.4 10*3/uL (ref 1.4–7.0)
Neutrophils: 58 %
Platelets: 193 10*3/uL (ref 150–450)
RBC: 4.35 x10E6/uL (ref 3.77–5.28)
RDW: 11.9 % (ref 11.7–15.4)
WBC: 5.8 10*3/uL (ref 3.4–10.8)

## 2022-06-15 LAB — LIPID PANEL W/O CHOL/HDL RATIO
Cholesterol, Total: 174 mg/dL (ref 100–199)
HDL: 73 mg/dL (ref >39)
LDL Chol Calc (NIH): 75 mg/dL (ref 0–99)
Triglycerides: 155 mg/dL — ABNORMAL HIGH (ref 0–149)
VLDL Cholesterol Cal: 26 mg/dL (ref 5–40)

## 2022-06-15 LAB — COMPREHENSIVE METABOLIC PANEL
ALT: 14 IU/L (ref 0–32)
AST: 17 IU/L (ref 0–40)
Albumin/Globulin Ratio: 2 (ref 1.2–2.2)
Albumin: 4.3 g/dL (ref 3.7–4.7)
Alkaline Phosphatase: 56 IU/L (ref 44–121)
BUN/Creatinine Ratio: 16 (ref 12–28)
BUN: 12 mg/dL (ref 8–27)
Bilirubin Total: 0.4 mg/dL (ref 0.0–1.2)
CO2: 26 mmol/L (ref 20–29)
Calcium: 9.5 mg/dL (ref 8.7–10.3)
Chloride: 104 mmol/L (ref 96–106)
Creatinine, Ser: 0.77 mg/dL (ref 0.57–1.00)
Globulin, Total: 2.1 g/dL (ref 1.5–4.5)
Glucose: 115 mg/dL — ABNORMAL HIGH (ref 70–99)
Potassium: 3.8 mmol/L (ref 3.5–5.2)
Sodium: 143 mmol/L (ref 134–144)
Total Protein: 6.4 g/dL (ref 6.0–8.5)
eGFR: 79 mL/min/{1.73_m2} (ref >59)

## 2022-06-15 LAB — T4, FREE: Free T4: 1.27 ng/dL (ref 0.82–1.77)

## 2022-06-15 LAB — VITAMIN D 25 HYDROXY (VIT D DEFICIENCY, FRACTURES): Vit D, 25-Hydroxy: 37.6 ng/mL (ref 30.0–100.0)

## 2022-06-15 LAB — VITAMIN B12: Vitamin B-12: 606 pg/mL (ref 232–1245)

## 2022-06-15 LAB — THYROID PEROXIDASE ANTIBODY: Thyroperoxidase Ab SerPl-aCnc: 112 IU/mL — ABNORMAL HIGH (ref 0–34)

## 2022-06-15 LAB — TSH: TSH: 3.48 u[IU]/mL (ref 0.450–4.500)

## 2022-06-18 ENCOUNTER — Other Ambulatory Visit: Payer: Self-pay | Admitting: Nurse Practitioner

## 2022-06-18 DIAGNOSIS — F419 Anxiety disorder, unspecified: Secondary | ICD-10-CM

## 2022-07-29 DIAGNOSIS — Z124 Encounter for screening for malignant neoplasm of cervix: Secondary | ICD-10-CM | POA: Diagnosis not present

## 2022-07-29 DIAGNOSIS — Z1231 Encounter for screening mammogram for malignant neoplasm of breast: Secondary | ICD-10-CM | POA: Diagnosis not present

## 2022-07-29 DIAGNOSIS — N763 Subacute and chronic vulvitis: Secondary | ICD-10-CM | POA: Diagnosis not present

## 2022-07-29 DIAGNOSIS — N952 Postmenopausal atrophic vaginitis: Secondary | ICD-10-CM | POA: Diagnosis not present

## 2022-08-02 ENCOUNTER — Ambulatory Visit
Admission: RE | Admit: 2022-08-02 | Discharge: 2022-08-02 | Disposition: A | Discharge status: Home / Self Care | Payer: Medicare PPO | Source: Ambulatory Visit | Attending: Nurse Practitioner | Admitting: Nurse Practitioner

## 2022-08-02 DIAGNOSIS — Z1231 Encounter for screening mammogram for malignant neoplasm of breast: Secondary | ICD-10-CM | POA: Diagnosis not present

## 2022-08-02 DIAGNOSIS — M8588 Other specified disorders of bone density and structure, other site: Secondary | ICD-10-CM | POA: Diagnosis not present

## 2022-08-02 DIAGNOSIS — M810 Age-related osteoporosis without current pathological fracture: Secondary | ICD-10-CM | POA: Diagnosis not present

## 2022-08-02 DIAGNOSIS — Z780 Asymptomatic menopausal state: Secondary | ICD-10-CM | POA: Diagnosis not present

## 2022-08-02 NOTE — Progress Notes (Signed)
Contacted via [place] afternoon Savannah [name], your DEXA continues to show osteoporosis, but no worsening.  Continue Boniva and Vitamin D.  Any questions?

## 2022-08-03 NOTE — Progress Notes (Signed)
Contacted via MyChart   Normal mammogram, may repeat in one year:)

## 2022-08-16 ENCOUNTER — Encounter: Payer: Self-pay | Admitting: Nurse Practitioner

## 2022-08-16 ENCOUNTER — Ambulatory Visit: Payer: Medicare PPO | Admitting: Nurse Practitioner

## 2022-08-16 VITALS — BP 131/81 | HR 87 | Temp 98.3°F | Wt 151.4 lb

## 2022-08-16 DIAGNOSIS — R051 Acute cough: Secondary | ICD-10-CM | POA: Diagnosis not present

## 2022-08-16 LAB — VERITOR FLU A/B WAIVED
Influenza A: NEGATIVE
Influenza B: NEGATIVE

## 2022-08-16 MED ORDER — AZITHROMYCIN 250 MG PO TABS: ORAL_TABLET | ORAL | 0 refills | Status: AC

## 2022-08-16 MED ORDER — GUAIFENESIN-CODEINE 100-10 MG/5ML PO SOLN: 5.0000 mL | Freq: Two times a day (BID) | ORAL | 0 refills | Status: DC | PRN

## 2022-08-16 NOTE — Assessment & Plan Note (Signed)
Acute for almost 7 days, flu testing negative in office today.  Obtain Covid testing, she did test negative at home last week.  Recommend she wear mask around family until results return.  Send in Azithromycin and cough syrup.  Recommend: - Increased rest - Increasing Fluids - Acetaminophen / ibuprofen as needed for fever/pain.  - Salt water gargling, chloraseptic spray and throat lozenges - Mucinex.  - Humidifying the air. Return for worsening or ongoing.

## 2022-08-16 NOTE — Progress Notes (Signed)
BP 131/81   Pulse 87   Temp 98.3 F (36.8 C) (Oral)   Wt 151 lb 6.4 oz (68.7 kg)   LMP  (LMP Unknown)   SpO2 95%   BMI 24.81 kg/m    Subjective:    Patient ID: Savannah [name], female    DOB: 28-Mar-1946, 76 y.o.   MRN: [id number]  HPI: Savannah [name] is a 76 y.o. female  Chief Complaint  Patient presents with   URI    Pt states she has been having a cough, congestion, and sinus pressure for the last 5 days. States she took an at home covid test that was negative.    UPPER RESPIRATORY TRACT INFECTION Had symptoms start Tuesday last week with a sore throat and scratchy feeling, Thursday night had worst sore throat of her entire life.  Sore throat went away, then sinus symptoms and cough presented.  Cough is keeping her awake. At home Covid testing was negative. Worst symptom: cough Fever: no Cough: yes Shortness of breath: no Wheezing: no Chest pain: no Chest tightness: yes Chest congestion: no Nasal congestion: no Runny nose: yes Post nasal drip: yes Sneezing: no Sore throat:  improved Swollen glands: no Sinus pressure: yes Headache: yes Face pain: yes Toothache: no Ear pain: none Ear pressure: yes bilateral Eyes red/itching:no Eye drainage/crusting: no  Vomiting: no Rash: no Fatigue: yes Sick contacts: no Strep contacts: no  Context: stable Recurrent sinusitis: no Relief with OTC cold/cough medications: yes  Treatments attempted: Tylenol and Halls lozenges    Relevant past medical, surgical, family and social history reviewed and updated as indicated. Interim medical history since our last visit reviewed. Allergies and medications reviewed and updated.  Review of Systems  Constitutional:  Positive for fatigue. Negative for activity change, appetite change, chills and fever.  HENT:  Positive for congestion, postnasal drip, rhinorrhea, sinus pressure and sore throat. Negative for ear discharge, ear pain, facial swelling, sinus pain, sneezing and voice change.    Respiratory:  Positive for cough and chest tightness. Negative for shortness of breath and wheezing.   Cardiovascular:  Negative for chest pain, palpitations and leg swelling.  Gastrointestinal: Negative.   Endocrine: Negative.   Neurological:  Positive for headaches. Negative for dizziness and numbness.  Psychiatric/Behavioral: Negative.      Per HPI unless specifically indicated above     Objective:    BP 131/81   Pulse 87   Temp 98.3 F (36.8 C) (Oral)   Wt 151 lb 6.4 oz (68.7 kg)   LMP  (LMP Unknown)   SpO2 95%   BMI 24.81 kg/m   Wt Readings from Last 3 Encounters:  08/16/22 151 lb 6.4 oz (68.7 kg)  06/14/22 149 lb 12.8 oz (67.9 kg)  10/01/21 149 lb 6.4 oz (67.8 kg)    Physical Exam Vitals and nursing note reviewed.  Constitutional:      General: She is awake. She is not in acute distress.    Appearance: She is well-developed and well-groomed. She is not ill-appearing or toxic-appearing.  HENT:     Head: Normocephalic.     Right Ear: Hearing, ear canal and external ear normal. A middle ear effusion is present.     Left Ear: Hearing, ear canal and external ear normal. A middle ear effusion is present.     Nose: No rhinorrhea.     Right Sinus: No maxillary sinus tenderness or frontal sinus tenderness.     Left Sinus: No maxillary sinus tenderness or  frontal sinus tenderness.     Mouth/Throat:     Mouth: Mucous membranes are moist.     Pharynx: Posterior oropharyngeal erythema (mild) present. No pharyngeal swelling or oropharyngeal exudate.  Eyes:     General: Lids are normal.        Right eye: No discharge.        Left eye: No discharge.     Conjunctiva/sclera: Conjunctivae normal.     Pupils: Pupils are equal, round, and reactive to light.  Neck:     Thyroid: No thyromegaly.     Vascular: No carotid bruit.  Cardiovascular:     Rate and Rhythm: Normal rate and regular rhythm.     Heart sounds: Normal heart sounds. No murmur heard.    No gallop.  Pulmonary:      Effort: Pulmonary effort is normal. No accessory muscle usage or respiratory distress.     Breath sounds: Normal breath sounds.  Abdominal:     General: Bowel sounds are normal.     Palpations: Abdomen is soft.  Musculoskeletal:     Cervical back: Normal range of motion and neck supple.     Right lower leg: No edema.     Left lower leg: No edema.  Skin:    General: Skin is warm and dry.  Neurological:     Mental Status: She is alert and oriented to person, place, and time.  Psychiatric:        Attention and Perception: Attention normal.        Mood and Affect: Mood normal.        Speech: Speech normal.        Behavior: Behavior normal. Behavior is cooperative.        Thought Content: Thought content normal.    Results for orders placed or performed in visit on 06/14/22  CBC with Differential/Platelet  Result Value Ref Range   WBC 5.8 3.4 - 10.8 x10E3/uL   RBC 4.35 3.77 - 5.28 x10E6/uL   Hemoglobin 14.0 11.1 - 15.9 g/dL   Hematocrit 41.0 34.0 - 46.6 %   MCV 94 79 - 97 fL   MCH 32.2 26.6 - 33.0 pg   MCHC 34.1 31.5 - 35.7 g/dL   RDW 11.9 11.7 - 15.4 %   Platelets 193 150 - 450 x10E3/uL   Neutrophils 58 Not Estab. %   Lymphs 29 Not Estab. %   Monocytes 11 Not Estab. %   Eos 2 Not Estab. %   Basos 0 Not Estab. %   Neutrophils Absolute 3.4 1.4 - 7.0 x10E3/uL   Lymphocytes Absolute 1.7 0.7 - 3.1 x10E3/uL   Monocytes Absolute 0.6 0.1 - 0.9 x10E3/uL   EOS (ABSOLUTE) 0.1 0.0 - 0.4 x10E3/uL   Basophils Absolute 0.0 0.0 - 0.2 x10E3/uL   Immature Granulocytes 0 Not Estab. %   Immature Grans (Abs) 0.0 0.0 - 0.1 x10E3/uL  Comprehensive metabolic panel  Result Value Ref Range   Glucose 115 (H) 70 - 99 mg/dL   BUN 12 8 - 27 mg/dL   Creatinine, Ser 0.77 0.57 - 1.00 mg/dL   eGFR 79 >59 mL/min/1.73   BUN/Creatinine Ratio 16 12 - 28   Sodium 143 134 - 144 mmol/L   Potassium 3.8 3.5 - 5.2 mmol/L   Chloride 104 96 - 106 mmol/L   CO2 26 20 - 29 mmol/L   Calcium 9.5 8.7 - 10.3  mg/dL   Total Protein 6.4 6.0 - 8.5 g/dL   Albumin 4.3 3.7 -  4.7 g/dL   Globulin, Total 2.1 1.5 - 4.5 g/dL   Albumin/Globulin Ratio 2.0 1.2 - 2.2   Bilirubin Total 0.4 0.0 - 1.2 mg/dL   Alkaline Phosphatase 56 44 - 121 IU/L   AST 17 0 - 40 IU/L   ALT 14 0 - 32 IU/L  Lipid Panel w/o Chol/HDL Ratio  Result Value Ref Range   Cholesterol, Total 174 100 - 199 mg/dL   Triglycerides 155 (H) 0 - 149 mg/dL   HDL 73 >39 mg/dL   VLDL Cholesterol Cal 26 5 - 40 mg/dL   LDL Chol Calc (NIH) 75 0 - 99 mg/dL  TSH  Result Value Ref Range   TSH 3.480 0.450 - 4.500 uIU/mL  Vitamin B12  Result Value Ref Range   Vitamin B-12 606 232 - 1,245 pg/mL  VITAMIN D 25 Hydroxy (Vit-D Deficiency, Fractures)  Result Value Ref Range   Vit D, 25-Hydroxy 37.6 30.0 - 100.0 ng/mL  T4, free  Result Value Ref Range   Free T4 1.27 0.82 - 1.77 ng/dL  Thyroid peroxidase antibody  Result Value Ref Range   Thyroperoxidase Ab SerPl-aCnc 112 (H) 0 - 34 IU/mL      Assessment & Plan:   Problem List Items Addressed This Visit       Other   Acute cough - Primary    Acute for almost 7 days, flu testing negative in office today.  Obtain Covid testing, she did test negative at home last week.  Recommend she wear mask around family until results return.  Send in Azithromycin and cough syrup.  Recommend: - Increased rest - Increasing Fluids - Acetaminophen / ibuprofen as needed for fever/pain.  - Salt water gargling, chloraseptic spray and throat lozenges - Mucinex.  - Humidifying the air. Return for worsening or ongoing.      Relevant Orders   Novel Coronavirus, NAA ([facility])   Veritor Flu A/B Waived     Follow up plan: Return if symptoms worsen or fail to improve.

## 2022-08-16 NOTE — Patient Instructions (Signed)

## 2022-08-17 ENCOUNTER — Ambulatory Visit: Payer: Self-pay

## 2022-08-17 ENCOUNTER — Encounter: Payer: Self-pay | Admitting: Nurse Practitioner

## 2022-08-17 LAB — NOVEL CORONAVIRUS, NAA: SARS-CoV-2, NAA: NOT DETECTED

## 2022-08-17 MED ORDER — PREDNISONE 20 MG PO TABS: 40.0000 mg | ORAL_TABLET | Freq: Every day | ORAL | 0 refills | Status: AC

## 2022-08-17 NOTE — Progress Notes (Signed)
Contacted via MyChart   Good evening, Covid is negative!!  Woohoo!!  Continue current treatment sent in.  Any questions?

## 2022-08-17 NOTE — Telephone Encounter (Signed)
  Chief Complaint: Missing medication - Prednisone Symptoms: nasal congestion and pressure, Cough with green phlegm Frequency: 7 days Pertinent Negatives: Patient denies  Disposition: '[]'$ ED /'[]'$ Urgent Care (no appt availability in office) / '[]'$ Appointment(In office/virtual)/ '[]'$  [facility]/ '[]'$ Home Care/ '[]'$ Refused Recommended Disposition /'[]'$ [facility]/ '[x]'$  Follow-up with PCP Additional Notes: Pt was seen yesterday at the office. Pt would like prednisone prescribed to reduce sinus pain and pressure. Pt has not started antibiotic. Pt was told not to start antibiotic until weds. Pt states that cough medicine prescribed was very effective, and she was able to sleep last night. Please advise   Summary: sinus headaches/requesting new Rx   Pt stated she was in the office yesterday to see Jolene and is requesting she send in a prednisone pack. Pt stated her sinus headaches are extremely bad in the mornings, and she is still coughing up green mucus. Pt stated she feels no better and feels the z-pack is not going to help what she has.   Pt seeking clinical advice.      Reason for Disposition  Prescription request for new medicine (not a refill)  Answer Assessment - Initial Assessment Questions 1. DRUG NAME: "What medicine do you need to have refilled?"     Prednisone 2. REFILLS REMAINING: "How many refills are remaining?" (Note: The label on the medicine or pill bottle will show how many refills are remaining. If there are no refills remaining, then a renewal may be needed.)     0 3. EXPIRATION DATE: "What is the expiration date?" (Note: The label states when the prescription will expire, and thus can no longer be refilled.)     0 4. PRESCRIBING HCP: "Who prescribed it?" Reason: If prescribed by specialist, call should be referred to that group.      5. SYMPTOMS: "Do you have any symptoms?"     Nasal congestion, chest congestion, green phlegm 6. PREGNANCY: "Is there any chance  that you are pregnant?" "When was your last menstrual period?"     na  Protocols used: Medication Refill and Renewal Call-A-AH

## 2022-08-17 NOTE — Telephone Encounter (Signed)
Spoke to patient via MyChart

## 2022-09-01 ENCOUNTER — Encounter: Payer: Self-pay | Admitting: Nurse Practitioner

## 2022-09-01 ENCOUNTER — Ambulatory Visit: Payer: Self-pay | Admitting: *Deleted

## 2022-09-01 MED ORDER — AZITHROMYCIN 250 MG PO TABS: ORAL_TABLET | ORAL | 0 refills | Status: DC

## 2022-09-01 NOTE — Telephone Encounter (Signed)
Summary: still has a cough/low grade fever/new Rx request.   Pt is calling back to follow up on a MyChart message stated she is vacationing in [place], and she still has a cough and not feeling 100% as well as might have a low-grade fever. Pt stated the z-pack given to her by PCP is in [place] and she would be taking it if she was home.   Pt is asking for a new Rx to be sent to [facility] on [address], [address] [place] [address]   Pt seeking clinical advice.       Chief Complaint: when finished meds symptoms came back Symptoms: cough, nasal congestion Frequency: constant Pertinent Negatives: Patient denies productive cough Disposition: '[]'$ ED /'[]'$ Urgent Care (no appt availability in office) / '[]'$ Appointment(In office/virtual)/ '[]'$  [facility]/ '[]'$ Home Care/ '[]'$ Refused Recommended Disposition /'[]'$ [facility]/ '[x]'$  Follow-up with PCP Additional Notes: See pt note above. She took the meds prescribed except recently. She was to get finished with prednisone before starting Z-pac. She went out of town and left the Z-pac at home and now her symptoms are back. She is requesting rx sent to pharm in [place], it is listed in pt's comment note.    Reason for Disposition  Cough  Answer Assessment - Initial Assessment Questions 1. ONSET: "When did the cough begin?"      Worsened when finished med 2. SEVERITY: "How bad is the cough today?"      Still  coughing but not productive 3. SPUTUM: "Describe the color of your sputum" (none, dry cough; clear, white, yellow, green)     na 4. HEMOPTYSIS: "Are you coughing up any blood?" If so ask: "How much?" (flecks, streaks, tablespoons, etc.)     no 5. DIFFICULTY BREATHING: "Are you having difficulty breathing?" If Yes, ask: "How bad is it?" (e.g., mild, moderate, severe)    - MILD: No SOB at rest, mild SOB with walking, speaks normally in sentences, can lie down, no retractions, pulse < 100.    - MODERATE: SOB at rest, SOB with  minimal exertion and prefers to sit, cannot lie down flat, speaks in phrases, mild retractions, audible wheezing, pulse 100-120.    - SEVERE: Very SOB at rest, speaks in single words, struggling to breathe, sitting hunched forward, retractions, pulse > 120      no 6. FEVER: "Do you have a fever?" If Yes, ask: "What is your temperature, how was it measured, and when did it start?"     Feel like has low grade fever  Protocols used: Cough - Acute Non-Productive-A-AH

## 2022-09-01 NOTE — Telephone Encounter (Signed)
Contacted via MyChart

## 2022-09-02 ENCOUNTER — Telehealth: Payer: Self-pay

## 2022-09-02 NOTE — Telephone Encounter (Signed)
Called and spoke with patient regarding AWV. Patient states that she does not want to have an AWV at this time.

## 2022-09-06 ENCOUNTER — Ambulatory Visit
Admission: RE | Admit: 2022-09-06 | Discharge: 2022-09-06 | Disposition: A | Discharge status: Home / Self Care | Payer: Medicare PPO | Source: Ambulatory Visit | Attending: Nurse Practitioner | Admitting: Nurse Practitioner

## 2022-09-06 ENCOUNTER — Encounter: Payer: Self-pay | Admitting: Nurse Practitioner

## 2022-09-06 ENCOUNTER — Ambulatory Visit: Payer: Medicare PPO | Admitting: Nurse Practitioner

## 2022-09-06 ENCOUNTER — Ambulatory Visit
Admission: RE | Admit: 2022-09-06 | Discharge: 2022-09-06 | Disposition: A | Discharge status: Home / Self Care | Payer: Medicare PPO | Attending: Nurse Practitioner | Admitting: Nurse Practitioner

## 2022-09-06 VITALS — BP 133/76 | HR 90 | Temp 99.4°F | Ht 65.5 in | Wt 152.3 lb

## 2022-09-06 DIAGNOSIS — R051 Acute cough: Secondary | ICD-10-CM

## 2022-09-06 DIAGNOSIS — R059 Cough, unspecified: Secondary | ICD-10-CM | POA: Diagnosis not present

## 2022-09-06 MED ORDER — GUAIFENESIN-CODEINE 100-10 MG/5ML PO SOLN: 5.0000 mL | Freq: Two times a day (BID) | ORAL | 0 refills | Status: DC | PRN

## 2022-09-06 MED ORDER — LEVOFLOXACIN 500 MG PO TABS: 500.0000 mg | ORAL_TABLET | Freq: Every day | ORAL | 0 refills | Status: AC

## 2022-09-06 MED ORDER — ALBUTEROL SULFATE HFA 108 (90 BASE) MCG/ACT IN AERS: 2.0000 | INHALATION_SPRAY | Freq: Four times a day (QID) | RESPIRATORY_TRACT | 0 refills | Status: DC | PRN

## 2022-09-06 MED ORDER — PREDNISONE 10 MG PO TABS
ORAL_TABLET | ORAL | 0 refills | Status: DC
Start: 2022-09-06 — End: 2022-10-08

## 2022-09-06 NOTE — Patient Instructions (Signed)

## 2022-09-06 NOTE — Progress Notes (Signed)
BP 133/76   Pulse 90   Temp 99.4 F (37.4 C) (Oral)   Ht 5' 5.5" (1.664 m)   Wt 152 lb 4.8 oz (69.1 kg)   LMP  (LMP Unknown)   SpO2 96%   BMI 24.96 kg/m    Subjective:    Patient ID: Savannah [name], female    DOB: [birth year], 76 y.o.   MRN: [id number]  HPI: Savannah [name] is a 76 y.o. female  Chief Complaint  Patient presents with   Bronchitis    Patient is here for possible Bronchitis. Patient says her symptoms started last week and called into the office and was told prescribed a antibiotic on 09/03/22 and says she took her last pill Thursday morning. Patient says she did not complete the medication as she experienced side effects. Patient says everything went downhill from there. Patient says she was seen 3 weeks ago by another provider and nothing has changed. Patient says she has completed a round of Prednisone and cough syrup and no help.    COUGH Has had ongoing issues for 3 weeks now, recent relapses in [place] of cough -- treated with Azithromycin x 2, Prednisone, and Tussionex.  Has had constant tickle in throat and hoarseness.  Temperature up a little this morning, but is drinking hot coffee.  Every year she reports sinus issues, but this has been the worst.  Had been Covid negative at home in past when initially presented. Duration: weeks Circumstances of initial development of cough: URI Cough severity: moderate Cough description: non-productive Aggravating factors:  all day, worse at night with laying down -- can't lay down Alleviating factors: nothing Status:  fluctuating Treatments attempted: cold/sinus, mucinex, and antibiotics Wheezing: yes Shortness of breath: no Chest pain: no Chest tightness:no Nasal congestion: yes Runny nose: no Postnasal drip: no Frequent throat clearing or swallowing: yes Hemoptysis: no Fevers: no -- drank hot coffee this morning Night sweats: no Weight loss: no Heartburn: no Recent foreign travel: no Tuberculosis contacts: no    Relevant past medical, surgical, family and social history reviewed and updated as indicated. Interim medical history since our last visit reviewed. Allergies and medications reviewed and updated.  Review of Systems  Constitutional:  Positive for fatigue. Negative for activity change, appetite change, chills and fever.  HENT:  Positive for congestion. Negative for ear discharge, ear pain, facial swelling, postnasal drip, rhinorrhea, sinus pressure, sinus pain, sneezing, sore throat and voice change.   Respiratory:  Positive for cough and chest tightness. Negative for shortness of breath and wheezing.   Cardiovascular:  Negative for chest pain, palpitations and leg swelling.  Gastrointestinal: Negative.   Neurological:  Negative for dizziness, numbness and headaches.  Psychiatric/Behavioral: Negative.      Per HPI unless specifically indicated above     Objective:    BP 133/76   Pulse 90   Temp 99.4 F (37.4 C) (Oral)   Ht 5' 5.5" (1.664 m)   Wt 152 lb 4.8 oz (69.1 kg)   LMP  (LMP Unknown)   SpO2 96%   BMI 24.96 kg/m   Wt Readings from Last 3 Encounters:  09/06/22 152 lb 4.8 oz (69.1 kg)  08/16/22 151 lb 6.4 oz (68.7 kg)  06/14/22 149 lb 12.8 oz (67.9 kg)    Physical Exam Vitals and nursing note reviewed.  Constitutional:      General: She is awake. She is not in acute distress.    Appearance: She is well-developed and well-groomed. She is not  ill-appearing or toxic-appearing.  HENT:     Head: Normocephalic.     Comments: Hoarseness present.    Right Ear: Hearing, ear canal and external ear normal. A middle ear effusion is present.     Left Ear: Hearing, ear canal and external ear normal. A middle ear effusion is present.     Nose: No rhinorrhea.     Right Sinus: No maxillary sinus tenderness or frontal sinus tenderness.     Left Sinus: No maxillary sinus tenderness or frontal sinus tenderness.     Mouth/Throat:     Mouth: Mucous membranes are moist.     Pharynx:  Posterior oropharyngeal erythema (mild) present. No pharyngeal swelling or oropharyngeal exudate.  Eyes:     General: Lids are normal.        Right eye: No discharge.        Left eye: No discharge.     Conjunctiva/sclera: Conjunctivae normal.     Pupils: Pupils are equal, round, and reactive to light.  Neck:     Thyroid: No thyromegaly.     Vascular: No carotid bruit.  Cardiovascular:     Rate and Rhythm: Normal rate and regular rhythm.     Heart sounds: Normal heart sounds. No murmur heard.    No gallop.  Pulmonary:     Effort: Pulmonary effort is normal. No accessory muscle usage or respiratory distress.     Breath sounds: Normal breath sounds.  Abdominal:     General: Bowel sounds are normal.     Palpations: Abdomen is soft.  Musculoskeletal:     Cervical back: Normal range of motion and neck supple.     Right lower leg: No edema.     Left lower leg: No edema.  Skin:    General: Skin is warm and dry.  Neurological:     Mental Status: She is alert and oriented to person, place, and time.  Psychiatric:        Attention and Perception: Attention normal.        Mood and Affect: Mood normal.        Speech: Speech normal.        Behavior: Behavior normal. Behavior is cooperative.        Thought Content: Thought content normal.     Results for orders placed or performed in visit on 08/16/22  Novel Coronavirus, NAA ([facility])   Specimen: Nasopharyngeal(NP) swabs in vial transport medium  Result Value Ref Range   SARS-CoV-2, NAA Not Detected Not Detected  Veritor Flu A/B Waived  Result Value Ref Range   Influenza A Negative Negative   Influenza B Negative Negative      Assessment & Plan:   Problem List Items Addressed This Visit       Other   Acute cough - Primary    Acute for > 3 weeks at this time, flu and Covid testing in past negative. Will obtain CXR to further assess and start long taper of Prednisone + Levaquin for 7 days & Albuterol inhaler.  Educated her at  length on this regimen. Recommend: - Increased rest - Increasing Fluids - Acetaminophen / ibuprofen as needed for fever/pain.  - Salt water gargling, chloraseptic spray and throat lozenges - Mucinex.  - Humidifying the air. Return in one week for lung check.      Relevant Orders   DG Chest 2 View     Follow up plan: Return in about 1 week (around 09/13/2022) for Cough.

## 2022-09-06 NOTE — Assessment & Plan Note (Signed)
Acute for > 3 weeks at this time, flu and Covid testing in past negative. Will obtain CXR to further assess and start long taper of Prednisone + Levaquin for 7 days & Albuterol inhaler.  Educated her at length on this regimen. Recommend: - Increased rest - Increasing Fluids - Acetaminophen / ibuprofen as needed for fever/pain.  - Salt water gargling, chloraseptic spray and throat lozenges - Mucinex.  - Humidifying the air. Return in one week for lung check.

## 2022-09-07 NOTE — Progress Notes (Signed)
Contacted via MyChart   Overall chest imaging returned and looks great with no pneumonia noted, great news.  However, continue current medication as ordered.:)

## 2022-09-13 ENCOUNTER — Ambulatory Visit: Payer: Medicare PPO | Admitting: Nurse Practitioner

## 2022-09-13 ENCOUNTER — Encounter: Payer: Self-pay | Admitting: Nurse Practitioner

## 2022-10-07 ENCOUNTER — Other Ambulatory Visit: Payer: Self-pay | Admitting: Nurse Practitioner

## 2022-10-07 MED ORDER — ATORVASTATIN CALCIUM 20 MG PO TABS: 20.0000 mg | ORAL_TABLET | Freq: Every day | ORAL | 4 refills | Status: DC

## 2022-10-07 NOTE — Patient Instructions (Addendum)
Genital Herpes Genital herpes is a common sexually transmitted infection (STI) that is caused by a virus. The virus spreads from person to person through contact with a sore, infected saliva, or infected skin. The virus can cause itching, blisters, and sores around the genitals or rectum. During an outbreak of infection, symptoms may last for several days and then go away. However, the virus remains in the body, so more outbreaks may happen in the future. The time between outbreaks varies and can be from months to years. Genital herpes can affect anyone. It is particularly concerning for pregnant women because the virus can be passed to the baby during delivery. Genital herpes is also a concern for people who have a weak disease-fighting system (immune system). What are the causes? This condition is caused by the herpes simplex virus, type 1 or type 2 (HSV-1 or HSV-2). The virus may spread through: Sexual contact with an infected person, including vaginal, anal, and oral sex. Contact with a herpes sore. The skin. This means that you can get herpes from an infected partner even if there are no blisters or sores present. Your partner may not know that he or she is infected. What increases the risk? You are more likely to develop this condition if: You have sex with many partners. You do not use latex or polyurethane condoms during sex. What are the signs or symptoms? Most people do not have symptoms or they have mild symptoms that may be mistaken for other skin problems. Symptoms may include: Small, red bumps near the genitals, rectum, or mouth. These bumps turn into blisters and then sores. Flu-like (influenza-like) symptoms, including: Fever. Body aches. Swollen lymph nodes. Headache. Painful urination. Pain and itching in the genital area or rectal area. Vaginal discharge. Tingling or shooting pain in the legs and buttocks. Generally, symptoms are more severe and last longer during the first  (primary) outbreak. Influenza-like symptoms are also more common during the primary outbreak. How is this diagnosed? This condition may be diagnosed based on: A physical exam. Your medical history. Blood tests. A test of a fluid sample (culture) from an open sore. How is this treated? There is no cure for this condition, but treatment with antiviral medicines can do the following: Speed up healing and relieve symptoms. Help to reduce the spread of the virus to sexual partners. Limit the chance of future outbreaks, or make future outbreaks shorter. Lessen symptoms of future outbreaks. Your health care provider may also recommend over-the-counter medicines to help with pain and itching. Follow these instructions at home: If you have an outbreak:  Keep the affected areas dry and clean. Avoid rubbing or touching blisters and sores. If you do touch blisters or sores: Wash your hands thoroughly with soap and water for at least 20 seconds. If soap and water are not available, use an alcohol-based hand sanitizer. Do not touch your eyes afterward. Sexual activity Do not have sexual contact during active outbreaks. Practice safe sex. Herpes can spread even if your partner does not have blisters or sores. Latex or polyurethane condoms and female condoms may help prevent the spread of the herpes virus. Managing pain and discomfort If directed, put ice on the painful area. To do this: Put ice in a plastic bag. Place a towel between your skin and the bag. Leave the ice on for 20 minutes, 2-3 times a day. Remove the ice if your skin turns bright red. This is very important. If you cannot feel pain, heat, or  cold, you have a greater risk of damage to the area. If told, take a cool sitz bath to help relieve pain or itching. A sitz bath is a water bath that you take while sitting down in water that is deep enough to cover your hips and buttocks. General instructions Take over-the-counter and  prescription medicines only as told by your health care provider. If you were prescribed an antiviral medicine, use it as told by your health care provider. Do not stop using the antiviral even if you start to feel better. Keep all follow-up visits. This is important. How is this prevented? Use condoms. Although you can get genital herpes during sexual contact even with the use of a condom, a condom can provide some protection. Avoid having multiple sexual partners. Talk with your sexual partner about any symptoms either of you may have. Also, talk with your partner about any history of STIs. Do not have sexual contact if you have active symptoms of genital herpes. Contact a health care provider if: Your symptoms are not improving with medicine. Your symptoms return, or you have new symptoms. You have a fever. You have abdominal pain. You have redness, swelling, or pain in your eye. You notice new sores on other parts of your body. You have had herpes and you become pregnant or plan to become pregnant. Get help right away if: You have symptoms of viral meningitis. This is rare but may happen if the virus spreads to the brain. Symptoms may include: Severe headache or stiff neck. Muscle aches. Nausea and vomiting. Sensitivity to light. Summary Genital herpes is a common sexually transmitted infection (STI) that is caused by the herpes simplex virus, type 1 or type 2 (HSV-1 or HSV-2). These viruses are most often spread through sexual contact with an infected person. You are more likely to develop this condition if you have sex with many partners or you do not use condoms during sex. Most people do not have symptoms or have mild symptoms that may be mistaken for other skin problems. Symptoms occur as outbreaks that may happen months or years apart. There is no cure for this condition, but treatment with oral antiviral medicines can reduce symptoms, reduce the chance of spreading the virus to  a partner, prevent future outbreaks, or shorten future outbreaks. This information is not intended to replace advice given to you by your health care provider. Make sure you discuss any questions you have with your health care provider. Document Revised: 09/10/2021 Document Reviewed: 09/10/2021 Elsevier Patient Education  2023 [employer]vier Inc.  

## 2022-10-07 NOTE — Telephone Encounter (Signed)
Rx 10/19 #90 4RF Requested Prescriptions  Pending Prescriptions Disp Refills  . atorvastatin (LIPITOR) 20 MG tablet [Pharmacy Med Name: ATORVASTATIN 20 MG TABLET] 90 tablet 0    Sig: Take 1 tablet (20 mg total) by mouth daily.     Cardiovascular:  Antilipid - Statins Failed - 10/07/2022 11:06 AM      Failed - Lipid Panel in normal range within the last 12 months    Cholesterol, Total  Date Value Ref Range Status  06/14/2022 174 100 - 199 mg/dL Final   LDL Chol Calc (NIH)  Date Value Ref Range Status  06/14/2022 75 0 - 99 mg/dL Final   HDL  Date Value Ref Range Status  06/14/2022 73 >39 mg/dL Final   Triglycerides  Date Value Ref Range Status  06/14/2022 155 (H) 0 - 149 mg/dL Final         Passed - Patient is not pregnant      Passed - Valid encounter within last 12 months    Recent Outpatient Visits          1 month ago Acute cough   [place], Oceola T, NP   1 month ago Acute cough   [place], Maurice T, NP   3 months ago Aortic atherosclerosis ([place])   [place], Henrine Screws T, NP   1 year ago Acute pain of left shoulder   [facility] [place], Lauren A, NP   1 year ago Chronic anxiety   [place], Barbaraann Faster, NP      Future Appointments            Tomorrow [place], Barbaraann Faster, NP [place] [place]   In 8 months [place], Barbaraann Faster, NP [facility]

## 2022-10-08 ENCOUNTER — Encounter: Payer: Self-pay | Admitting: Nurse Practitioner

## 2022-10-08 ENCOUNTER — Ambulatory Visit: Payer: Medicare PPO | Admitting: Nurse Practitioner

## 2022-10-08 VITALS — BP 130/72 | HR 84 | Temp 98.6°F | Wt 150.7 lb

## 2022-10-08 DIAGNOSIS — R8281 Pyuria: Secondary | ICD-10-CM

## 2022-10-08 DIAGNOSIS — A6004 Herpesviral vulvovaginitis: Secondary | ICD-10-CM

## 2022-10-08 DIAGNOSIS — R399 Unspecified symptoms and signs involving the genitourinary system: Secondary | ICD-10-CM

## 2022-10-08 DIAGNOSIS — A6000 Herpesviral infection of urogenital system, unspecified: Secondary | ICD-10-CM | POA: Insufficient documentation

## 2022-10-08 LAB — URINALYSIS, ROUTINE W REFLEX MICROSCOPIC
Bilirubin, UA: NEGATIVE
Glucose, UA: NEGATIVE
Ketones, UA: NEGATIVE
Nitrite, UA: NEGATIVE
Protein,UA: NEGATIVE
Specific Gravity, UA: 1.025 (ref 1.005–1.030)
Urobilinogen, Ur: 0.2 mg/dL (ref 0.2–1.0)
pH, UA: 5.5 (ref 5.0–7.5)

## 2022-10-08 LAB — MICROSCOPIC EXAMINATION
Bacteria, UA: NONE SEEN
Epithelial Cells (non renal): NONE SEEN /hpf (ref 0–10)

## 2022-10-08 LAB — WET PREP FOR TRICH, YEAST, CLUE
Clue Cell Exam: NEGATIVE
Trichomonas Exam: NEGATIVE
Yeast Exam: NEGATIVE

## 2022-10-08 MED ORDER — SCOPOLAMINE 1 MG/3DAYS TD PT72: 1.0000 | MEDICATED_PATCH | TRANSDERMAL | 0 refills | Status: DC

## 2022-10-08 MED ORDER — VALACYCLOVIR HCL 1 G PO TABS: 1000.0000 mg | ORAL_TABLET | Freq: Two times a day (BID) | ORAL | 0 refills | Status: AC

## 2022-10-08 NOTE — Progress Notes (Signed)
Acute Office Visit  Subjective:     Patient ID: Savannah [name], female    DOB: December 15, 1946, 76 y.o.   MRN: [id number]  Chief Complaint  Patient presents with   Urinary Tract Infection    Pt states she has been having burning for the past 3 weeks. States she does have vaginitis. States she has been having itching and some pain as well.     Started with symptoms approximately 3 weeks ago, was recently on abx for URI in August/September.  Having some vaginal issues with itching and discomfort.  Underlying vaginitis present -- follows with GYN in [place].  Uses vaginal Estrace cream.  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  GYN follows all of this.  Recently had small, white blisters to vaginal area.  She feels some of these have gone away and clearing up, but not 100%.  Current CrCl based on June 2023 labs = 66.  Urinary Tract Infection  This is a new problem. The current episode started 1 to 4 weeks ago. The problem occurs every urination. The problem has been unchanged. The quality of the pain is described as burning. The pain is at a severity of 2/10. There has been no fever. She is Sexually active (not presently due to discomfort). There is No history of pyelonephritis. Pertinent negatives include no chills, discharge, frequency, hematuria, hesitancy, nausea, urgency or vomiting. She has tried increased fluids for the symptoms. The treatment provided no relief. There is no history of recurrent UTIs.   Patient is in today for vaginal discomfort  Review of Systems  Constitutional:  Negative for chills, diaphoresis, fever and malaise/fatigue.  Respiratory: Negative.    Cardiovascular: Negative.   Gastrointestinal:  Negative for nausea and vomiting.  Genitourinary:  Negative for frequency, hematuria, hesitancy and urgency.  Neurological: Negative.   Psychiatric/Behavioral: Negative.        Objective:    BP 130/72 (BP Location: Left Arm, Patient  Position: Sitting, Cuff Size: Normal)   Pulse 84   Temp 98.6 F (37 C) (Oral)   Wt 150 lb 11.2 oz (68.4 kg)   LMP  (LMP Unknown)   SpO2 96%   BMI 24.70 kg/m  BP Readings from Last 3 Encounters:  10/08/22 130/72  09/06/22 133/76  08/16/22 131/81   Wt Readings from Last 3 Encounters:  10/08/22 150 lb 11.2 oz (68.4 kg)  09/06/22 152 lb 4.8 oz (69.1 kg)  08/16/22 151 lb 6.4 oz (68.7 kg)      Physical Exam Vitals and nursing note reviewed. Exam conducted with a chaperone present.  Constitutional:      General: She is awake. She is not in acute distress.    Appearance: She is well-developed and well-groomed. She is not ill-appearing or toxic-appearing.  HENT:     Head: Normocephalic.     Right Ear: Hearing normal.     Left Ear: Hearing normal.  Eyes:     General: Lids are normal.        Right eye: No discharge.        Left eye: No discharge.     Conjunctiva/sclera: Conjunctivae normal.     Pupils: Pupils are equal, round, and reactive to light.  Neck:     Thyroid: No thyromegaly.     Vascular: No carotid bruit.  Cardiovascular:     Rate and Rhythm: Normal rate and regular rhythm.     Heart sounds: Normal heart sounds. No  murmur heard.    No gallop.  Pulmonary:     Effort: Pulmonary effort is normal. No accessory muscle usage or respiratory distress.     Breath sounds: Normal breath sounds.  Abdominal:     General: Bowel sounds are normal. There is no distension.     Palpations: Abdomen is soft.     Tenderness: There is no abdominal tenderness.  Genitourinary:    Exam position: Lithotomy position.     Labia:        Right: Rash present.        Left: Rash present.      Comments: Herpes vesicles noted to bilateral labia with erythema. Musculoskeletal:     Cervical back: Normal range of motion and neck supple.     Right lower leg: No edema.     Left lower leg: No edema.  Skin:    General: Skin is warm and dry.  Neurological:     Mental Status: She is alert and  oriented to person, place, and time.  Psychiatric:        Attention and Perception: Attention normal.        Mood and Affect: Mood normal.        Speech: Speech normal.        Behavior: Behavior normal. Behavior is cooperative.        Thought Content: Thought content normal.    No results found for any visits on 10/08/22.     Assessment & Plan:   Problem List Items Addressed This Visit       Genitourinary   Genital herpes - Primary    Acute episode with chronic underlying, last outbreak years ago.  At this time will treat with Valtrex 1000 MG BID for 10 days.  Educated her on this and regimen for treatment + genital herpes.        Relevant Medications   valACYclovir (VALTREX) 1000 MG tablet   Other Visit Diagnoses     Urinary symptom or sign       Urine with leuks and blood, will send for culture and treat as needed.  Wet prep negative.   Relevant Orders   Urinalysis, Routine w reflex microscopic   WET PREP FOR TRICH, YEAST, CLUE   Pyuria       Urine for culture.   Relevant Orders   Urine Culture      Meds ordered this encounter  Medications   valACYclovir (VALTREX) 1000 MG tablet    Sig: Take 1 tablet (1,000 mg total) by mouth 2 (two) times daily for 10 days.    Dispense:  20 tablet    Refill:  0   scopolamine (TRANSDERM-SCOP) 1 MG/3DAYS    Sig: Place 1 patch (1.5 mg total) onto the skin every 3 (three) days.    Dispense:  1 patch    Refill:  0    Return if symptoms worsen or fail to improve.  Venita Lick, NP

## 2022-10-08 NOTE — Assessment & Plan Note (Signed)
Acute episode with chronic underlying, last outbreak years ago.  At this time will treat with Valtrex 1000 MG BID for 10 days.  Educated her on this and regimen for treatment + genital herpes.

## 2022-10-11 ENCOUNTER — Encounter: Payer: Self-pay | Admitting: Nurse Practitioner

## 2022-10-11 LAB — URINE CULTURE

## 2022-10-11 NOTE — Progress Notes (Signed)
Contacted via MyChart   No urine infection!!  Great news!! How are you feeling?  Valtrex helping?

## 2022-10-22 DIAGNOSIS — D2271 Melanocytic nevi of right lower limb, including hip: Secondary | ICD-10-CM | POA: Diagnosis not present

## 2022-10-22 DIAGNOSIS — L570 Actinic keratosis: Secondary | ICD-10-CM | POA: Diagnosis not present

## 2022-10-22 DIAGNOSIS — L538 Other specified erythematous conditions: Secondary | ICD-10-CM | POA: Diagnosis not present

## 2022-10-22 DIAGNOSIS — Z85828 Personal history of other malignant neoplasm of skin: Secondary | ICD-10-CM | POA: Diagnosis not present

## 2022-10-22 DIAGNOSIS — L820 Inflamed seborrheic keratosis: Secondary | ICD-10-CM | POA: Diagnosis not present

## 2022-10-22 DIAGNOSIS — L814 Other melanin hyperpigmentation: Secondary | ICD-10-CM | POA: Diagnosis not present

## 2022-10-22 DIAGNOSIS — D2261 Melanocytic nevi of right upper limb, including shoulder: Secondary | ICD-10-CM | POA: Diagnosis not present

## 2022-10-22 DIAGNOSIS — L309 Dermatitis, unspecified: Secondary | ICD-10-CM | POA: Diagnosis not present

## 2022-12-29 ENCOUNTER — Ambulatory Visit: Payer: Medicare PPO | Admitting: Nurse Practitioner

## 2022-12-29 ENCOUNTER — Encounter: Payer: Self-pay | Admitting: Nurse Practitioner

## 2022-12-29 VITALS — BP 136/70 | HR 90 | Temp 98.8°F | Ht 65.51 in | Wt 156.6 lb

## 2022-12-29 DIAGNOSIS — R051 Acute cough: Secondary | ICD-10-CM | POA: Diagnosis not present

## 2022-12-29 MED ORDER — PREDNISONE 20 MG PO TABS: 40.0000 mg | ORAL_TABLET | Freq: Every day | ORAL | 0 refills | Status: AC

## 2022-12-29 MED ORDER — GUAIFENESIN-CODEINE 100-10 MG/5ML PO SOLN: 5.0000 mL | Freq: Two times a day (BID) | ORAL | 0 refills | Status: DC | PRN

## 2022-12-29 NOTE — Progress Notes (Signed)
Acute Office Visit  Subjective:     Patient ID: Savannah [name], female    DOB: 02-04-1946, 77 y.o.   MRN: [id number]  Chief Complaint  Patient presents with   Cough    Runny nose, congestion. Started on Monday    Traveled on airplane recently.  Returned Friday night off the plane.  No fever.  She does not feel it is Covid.  Started with symptoms Monday morning.    Cough This is a new problem. The current episode started in the past 7 days. The problem has been unchanged. The problem occurs constantly. The cough is Non-productive. Associated symptoms include ear congestion, headaches, postnasal drip and rhinorrhea. Pertinent negatives include no chest pain, chills, ear pain, fever, hemoptysis, myalgias, nasal congestion, sore throat, shortness of breath, sweats or wheezing. Nothing aggravates the symptoms. She has tried OTC cough suppressant and rest for the symptoms. The treatment provided mild relief. There is no history of asthma, bronchitis, COPD, emphysema or pneumonia.   Patient is in today for cough and rhinorrhea.  Review of Systems  Constitutional:  Negative for chills, fever and malaise/fatigue.  HENT:  Positive for postnasal drip and rhinorrhea. Negative for congestion, ear discharge, ear pain, sinus pain and sore throat.   Respiratory:  Positive for cough. Negative for hemoptysis, shortness of breath and wheezing.   Cardiovascular:  Negative for chest pain and palpitations.  Musculoskeletal:  Negative for myalgias.  Neurological:  Positive for headaches.  Psychiatric/Behavioral: Negative.        Objective:    BP 136/70 (BP Location: Left Arm, Patient Position: Sitting)   Pulse 90   Temp 98.8 F (37.1 C) (Oral)   Ht 5' 5.51" (1.664 m)   Wt 156 lb 9.6 oz (71 kg)   LMP  (LMP Unknown)   SpO2 98%   BMI 25.65 kg/m  BP Readings from Last 3 Encounters:  12/29/22 136/70  10/08/22 130/72  09/06/22 133/76   Wt Readings from Last 3 Encounters:  12/29/22 156 lb 9.6 oz (71  kg)  10/08/22 150 lb 11.2 oz (68.4 kg)  09/06/22 152 lb 4.8 oz (69.1 kg)    Physical Exam Vitals and nursing note reviewed.  Constitutional:      General: She is awake. She is not in acute distress.    Appearance: She is well-developed and well-groomed. She is not ill-appearing or toxic-appearing.  HENT:     Head: Normocephalic.     Comments: Hoarseness present.    Right Ear: Hearing, ear canal and external ear normal. A middle ear effusion is present.     Left Ear: Hearing, ear canal and external ear normal. A middle ear effusion is present.     Nose: No rhinorrhea.     Right Sinus: No maxillary sinus tenderness or frontal sinus tenderness.     Left Sinus: No maxillary sinus tenderness or frontal sinus tenderness.     Mouth/Throat:     Mouth: Mucous membranes are moist.     Pharynx: Posterior oropharyngeal erythema (mild) present. No pharyngeal swelling or oropharyngeal exudate.  Eyes:     General: Lids are normal.        Right eye: No discharge.        Left eye: No discharge.     Conjunctiva/sclera: Conjunctivae normal.     Pupils: Pupils are equal, round, and reactive to light.  Neck:     Thyroid: No thyromegaly.     Vascular: No carotid bruit.  Cardiovascular:  Rate and Rhythm: Normal rate and regular rhythm.     Heart sounds: Normal heart sounds. No murmur heard.    No gallop.  Pulmonary:     Effort: Pulmonary effort is normal. No accessory muscle usage or respiratory distress.     Breath sounds: Wheezing present. No decreased breath sounds.     Comments: Occasional mild expiratory wheezes throughout. Abdominal:     General: Bowel sounds are normal.     Palpations: Abdomen is soft.  Musculoskeletal:     Cervical back: Normal range of motion and neck supple.     Right lower leg: No edema.     Left lower leg: No edema.  Skin:    General: Skin is warm and dry.  Neurological:     Mental Status: She is alert and oriented to person, place, and time.  Psychiatric:         Attention and Perception: Attention normal.        Mood and Affect: Mood normal.        Speech: Speech normal.        Behavior: Behavior normal. Behavior is cooperative.        Thought Content: Thought content normal.    No results found for any visits on 12/29/22.      Assessment & Plan:   Problem List Items Addressed This Visit       Other   Acute cough - Primary    Acute for almost 3 days. Prefers no testing in office today.  Send in Prednisone 40 MG daily for 5 days and cough syrup -- she is aware to notify provider if cough ongoing for 7 days or more, then will send in [place].  Educated on abx resistance and guidelines for abx use.  Recommend: - Increased rest - Increasing Fluids - Acetaminophen / ibuprofen as needed for fever/pain.  - Salt water gargling, chloraseptic spray and throat lozenges - Mucinex.  - Humidifying the air. Return for worsening or ongoing.       Meds ordered this encounter  Medications   predniSONE (DELTASONE) 20 MG tablet    Sig: Take 2 tablets (40 mg total) by mouth daily with breakfast for 5 days.    Dispense:  10 tablet    Refill:  0   guaiFENesin-codeine 100-10 MG/5ML syrup    Sig: Take 5 mLs by mouth 2 (two) times daily as needed for cough.    Dispense:  118 mL    Refill:  0    Return if symptoms worsen or fail to improve.  Venita Lick, NP

## 2022-12-29 NOTE — Patient Instructions (Signed)

## 2022-12-29 NOTE — Assessment & Plan Note (Signed)
Acute for almost 3 days. Prefers no testing in office today.  Send in Prednisone 40 MG daily for 5 days and cough syrup -- she is aware to notify provider if cough ongoing for 7 days or more, then will send in [place].  Educated on abx resistance and guidelines for abx use.  Recommend: - Increased rest - Increasing Fluids - Acetaminophen / ibuprofen as needed for fever/pain.  - Salt water gargling, chloraseptic spray and throat lozenges - Mucinex.  - Humidifying the air. Return for worsening or ongoing.

## 2023-01-04 ENCOUNTER — Encounter: Payer: Self-pay | Admitting: Nurse Practitioner

## 2023-01-04 MED ORDER — AZITHROMYCIN 250 MG PO TABS: ORAL_TABLET | ORAL | 0 refills | Status: AC

## 2023-02-22 ENCOUNTER — Encounter: Payer: Self-pay | Admitting: Nurse Practitioner

## 2023-03-04 DIAGNOSIS — D485 Neoplasm of uncertain behavior of skin: Secondary | ICD-10-CM | POA: Diagnosis not present

## 2023-03-04 DIAGNOSIS — L718 Other rosacea: Secondary | ICD-10-CM | POA: Diagnosis not present

## 2023-03-04 DIAGNOSIS — D0471 Carcinoma in situ of skin of right lower limb, including hip: Secondary | ICD-10-CM | POA: Diagnosis not present

## 2023-04-04 ENCOUNTER — Encounter: Payer: Self-pay | Admitting: Nurse Practitioner

## 2023-04-04 ENCOUNTER — Ambulatory Visit: Payer: Self-pay

## 2023-04-04 ENCOUNTER — Telehealth (INDEPENDENT_AMBULATORY_CARE_PROVIDER_SITE_OTHER): Payer: Medicare PPO | Admitting: Nurse Practitioner

## 2023-04-04 DIAGNOSIS — U071 COVID-19: Secondary | ICD-10-CM | POA: Diagnosis not present

## 2023-04-04 MED ORDER — GUAIFENESIN-CODEINE 100-10 MG/5ML PO SOLN: 5.0000 mL | Freq: Two times a day (BID) | ORAL | 0 refills | Status: DC | PRN

## 2023-04-04 NOTE — Assessment & Plan Note (Signed)
Acute with symptoms starting today, tested positive at home.  Is aware to self quarantine for 5 days and then 5 days of masking.  Discussed Paxlovid, she wishes to hold off on this at this time -- but is aware she has a 5 day window to start, will alert provider if worsening symptoms over next day or two, will then send in if requested -- she is aware to hold Atorvastatin if started on Paxlovid.  Script for [employer] sent in.  Recommend: - Increased rest - Increasing Fluids - Acetaminophen as needed for fever/pain.  - Salt water gargling, chloraseptic spray and throat lozenges - Mucinex.  - Humidifying the air.

## 2023-04-04 NOTE — Telephone Encounter (Signed)
  Chief Complaint: URI s/s Symptoms: Dry cough, sinus congestion, fever 100.4, HA, and BA Frequency: Last night Pertinent Negatives: Patient denies  Disposition: [] ED /[] Urgent Care (no appt availability in office) / [x] Appointment(In office/virtual)/ []  [facility]/ [] Home Care/ [] Refused Recommended Disposition /[]  Mobile Bus/ []  Follow-up with PCP Additional Notes: Pt woke up in the middle of the night with fever of 100.4, also dry cough, HA and BA. PT is afraid this may be COVID or FLU. Pt will go to [facility] for testing. If positive pt will call back to schedule [facility]. If negative pt will call back. Pt has in office appt scheduled for tomorrow.    Summary: Pt having flu like symptoms   Pt stated she is having flu like symptoms. Attempted to schedule pt for an appt but there was no appts available until Wednesday 04/06/23. Pt stated that is not helping her and requesting call back asap. Pt requests to just come in for flu and/or Covid test if possible     Reason for Disposition  Common cold with no complications  Answer Assessment - Initial Assessment Questions 1. ONSET: "When did the nasal discharge start?"      Overnight 2. AMOUNT: "How much discharge is there?"       3. COUGH: "Do you have a cough?" If Yes, ask: "Describe the color of your sputum" (clear, white, yellow, green)     Yes - dry 4. RESPIRATORY DISTRESS: "Describe your breathing."      No issue 5. FEVER: "Do you have a fever?" If Yes, ask: "What is your temperature, how was it measured, and when did it start?"     100.4 6. SEVERITY: "Overall, how bad are you feeling right now?" (e.g., doesn't interfere with normal activities, staying home from school/work, staying in bed)      Feels good currently 7. OTHER SYMPTOMS: "Do you have any other symptoms?" (e.g., sore throat, earache, wheezing, vomiting)     HA, BA, Sinus drainage, dry cough  Protocols used: Common Cold-A-AH

## 2023-04-04 NOTE — Telephone Encounter (Signed)
I talked with someone earlier today at 10:00.   I'm positive for Covid.   Went to [facility].   I'm calling to see what to do.  This just came on in the middle of the night.  She had a Same Day appt scheduled for tomorrow in the office with Prescott Gum, FNP.    I cancelled this appt after I was able to schedule her with Aura Dials, NP for today as a virtual appt at 3:20.

## 2023-04-04 NOTE — Patient Instructions (Signed)
COVID-19 COVID-19 is an infection caused by a virus called SARS-CoV-2. Most people who get COVID-19 have mild to moderate symptoms. Some have little to no symptoms. In others, the virus may cause a severe infection. What are the causes? COVID-19 is caused by a coronavirus. The virus may be in the air as droplets or as tiny specks of fluid (aerosols). It may also be on surfaces. You may catch the virus if you: Breathe in droplets when a person with COVID-19 breathes, speaks, sings, coughs, or sneezes. Touch something that has the virus on it and then touch your mouth, nose, or eyes. What increases the risk? Risk for infection: You are more likely to get COVID-19 if: You are within 6 ft (1.8 m) of a person who has COVID-19 for 15 minutes or longer. You provide care to a person who has COVID-19. You are in close contact with others. This includes hugging, kissing, or sharing utensils. Risk for serious illness caused by COVID-19: You are more likely to get very ill from COVID-19 if: You have cancer. You have a long-term (chronic) disease. This may be: A chronic lung disease, such as pulmonary embolism, chronic obstructive pulmonary disease (COPD), or cystic fibrosis. A disease that affects your body's defense system (immune system). If you have a weak immune system, you are said to be immunocompromised. A serious heart condition, such as heart failure, coronary artery disease, or cardiomyopathy. Diabetes. Chronic kidney disease. A liver disease, such as cirrhosis, nonalcoholic fatty liver disease, alcoholic liver disease, or autoimmune hepatitis. You are obese. You are pregnant or were just pregnant. You have sickle cell disease. What are the signs or symptoms? Symptoms of COVID-19 can range from mild to severe. They may appear any time from 2 to 14 days after you are exposed. They include: Fever or chills. Shortness of breath or trouble breathing. Feeling tired. Headaches, body aches, or  muscle aches. A runny or stuffy nose. Sneezing, coughing, or a sore throat. New loss of taste or smell. You may also have stomach problems, such as nausea, vomiting, or diarrhea. In some cases, you may not have any symptoms. How is this diagnosed? COVID-19 may be diagnosed by testing a sample to check for the virus. The most common tests are the PCR test and the antigen test. Tests may be done in the lab or at home. They include: Using a swab to take a sample of fluid from your nose. Testing a sample of saliva from your mouth. Testing a sample of mucus from your lungs (sputum). How is this treated? Treatment for COVID-19 depends on how severe your condition is. Mild symptoms can be treated at home. You should rest, drink fluids, and take over-the-counter medicine. If you have symptoms and risk factors, you may be prescribed a medicine that fights viruses (antiviral). Severe symptoms may be treated in a hospital intensive care unit (ICU). Treatment may include: Extra oxygen given through a tube in the nose, a face mask, or a hood. Medicines. These may include: Antivirals, such as remdesivir. Anti-inflammatories, such as corticosteroids. These help reduce inflammation. Antithrombotics. These help prevent or treat blood clots. Convalescent plasma. This helps boost your immune system. Prone positioning. This is when you are laid on your stomach to help oxygen get into your lungs. Infection control measures. If you are at risk for a more serious illness, your health care provider may prescribe two medicines to help your immune system protect you. These are called long-acting monoclonal antibodies. They are given together   every 6 months. How is this prevented? To protect yourself: Get the vaccine or vaccine series if you meet the guidelines. You can even get the vaccine while you are pregnant or making breast milk (lactating). Get an added dose of the vaccine if you are immunocompromised. This  applies if you have had an organ transplant or if you have a condition that affects your immune system. You should get the added dose 4 weeks after you got the first one. If you get an mRNA vaccine, you will need to get 3 doses. Talk to your provider about getting experimental monoclonal antibodies. This treatment can help prevent severe illness. It may be given to you if: You are immunocompromised. You cannot get the vaccine. You may not get the vaccine if you have a severe allergic reaction to it or to what it is made of. You are not fully vaccinated. You are in a place where there is COVID-19 and: You are in close contact with someone who has COVID-19. You are at high risk of being exposed. You are at risk of illness from new variants of the virus. To protect others: If you have symptoms of COVID-19, take steps to stop the virus from spreading. Stay home. Leave your house only to get medical care. Do not use public transit. Do not travel while you are sick. Wash your hands often with soap and water for at least 20 seconds. If soap and water are not available, use alcohol-based hand sanitizer. Make sure that all people in your household wash their hands well and often. Cough or sneeze into a tissue or your sleeve or elbow. Do not cough or sneeze into your hand or into the air. Where to find more information Centers for Disease Control and Prevention (CDC): cdc.gov World Health Organization (WHO): who.int Get help right away if: You have trouble breathing. You have pain or pressure in your chest. You are confused. Your lips or fingernails turn blue. You have trouble waking from sleep. Your symptoms get worse. These symptoms may be an emergency. Get help right away. Call 911. Do not wait to see if the symptoms will go away. Do not drive yourself to the hospital. This information is not intended to replace advice given to you by your health care provider. Make sure you discuss any  questions you have with your health care provider. Document Revised: 08/20/2022 Document Reviewed: 08/20/2022 Elsevier Patient Education  2023 Elsevier Inc.  

## 2023-04-04 NOTE — Progress Notes (Signed)
LMP  (LMP Unknown)    Subjective:    Patient ID: Savannah [name], female    DOB: 11-29-1946, 77 y.o.   MRN: [id number]  HPI: Savannah [name] is a 77 y.o. female  Chief Complaint  Patient presents with   Covid Positive    Tested positive today. Symptoms: Headache, 100.4 fever this morning, nasal congestion and cough.  Has taken Tylenol this morning for fever.   This visit was completed via video visit through MyChart due to the restrictions of the COVID-19 pandemic. All issues as above were discussed and addressed. Physical exam was done as above through visual confirmation on video through MyChart. If it was felt that the patient should be evaluated in the office, they were directed there. The patient verbally consented to this visit. Location of the patient: home Location of the provider: work Those involved with this call:  Provider: Aura Dials, DNP CMA: Tristan Schroeder, CMA Front Desk/Registration: Ozella Almond  Time spent on call:  21 minutes with patient face to face via video conference. More than 50% of this time was spent in counseling and coordination of care. 15 minutes total spent in review of patient's record and preparation of their chart.  I verified patient identity using two factors (patient name and date of birth). Patient consents verbally to being seen via telemedicine visit today.    COVID POSITIVE: Tested positive for Covid today, woke up with symptoms this morning.  Has had fever, congestion, and cough.  Tylenol is helping.  Has been Covid vaccinated.   Fever: yes - T max 100.4 Cough: yes Shortness of breath: no Wheezing: no Chest pain: no Chest tightness: no Chest congestion: no Nasal congestion: yes Runny nose: yes Post nasal drip: yes Sneezing: no Sore throat: no Swollen glands: no Sinus pressure: no Headache: yes Face pain: no Toothache: no Ear pain: no Ear pressure: no Eyes red/itching:no Eye drainage/crusting: no  Vomiting: no Rash:  no Fatigue: yes Sick contacts: no Strep contacts: no  Context: stable Recurrent sinusitis: no Relief with OTC cold/cough medications: yes  Treatments attempted: Tylenol    Relevant past medical, surgical, family and social history reviewed and updated as indicated. Interim medical history since our last visit reviewed. Allergies and medications reviewed and updated.  Review of Systems  Constitutional:  Positive for fatigue and fever. Negative for activity change, appetite change and chills.  HENT:  Positive for congestion, postnasal drip and rhinorrhea. Negative for ear discharge, ear pain, facial swelling, sinus pressure, sinus pain, sneezing, sore throat and voice change.   Respiratory:  Positive for cough. Negative for chest tightness, shortness of breath and wheezing.   Cardiovascular:  Negative for chest pain, palpitations and leg swelling.  Gastrointestinal: Negative.   Neurological:  Negative for dizziness, numbness and headaches.  Psychiatric/Behavioral: Negative.      Per HPI unless specifically indicated above     Objective:    LMP  (LMP Unknown)   Wt Readings from Last 3 Encounters:  12/29/22 156 lb 9.6 oz (71 kg)  10/08/22 150 lb 11.2 oz (68.4 kg)  09/06/22 152 lb 4.8 oz (69.1 kg)    Physical Exam Vitals and nursing note reviewed.  Constitutional:      General: She is awake. She is not in acute distress.    Appearance: She is well-developed and well-groomed. She is not ill-appearing or toxic-appearing.  HENT:     Head: Normocephalic.     Right Ear: Hearing normal.     Left  Ear: Hearing normal.  Eyes:     General: Lids are normal.        Right eye: No discharge.        Left eye: No discharge.     Conjunctiva/sclera: Conjunctivae normal.  Pulmonary:     Effort: Pulmonary effort is normal. No accessory muscle usage or respiratory distress.  Musculoskeletal:     Cervical back: Normal range of motion.  Neurological:     Mental Status: She is alert and  oriented to person, place, and time.  Psychiatric:        Attention and Perception: Attention normal.        Mood and Affect: Mood normal.        Behavior: Behavior normal. Behavior is cooperative.        Thought Content: Thought content normal.        Judgment: Judgment normal.    Results for orders placed or performed in visit on 10/08/22  WET PREP FOR TRICH, YEAST, CLUE   Specimen: Urine   Urine  Result Value Ref Range   Trichomonas Exam Negative Negative   Yeast Exam Negative Negative   Clue Cell Exam Negative Negative  Urine Culture   Specimen: Urine   UR  Result Value Ref Range   Urine Culture, Routine Final report    Organism ID, Bacteria Comment   Microscopic Examination   Urine  Result Value Ref Range   WBC, UA 0-5 0 - 5 /hpf   RBC, Urine 0-2 0 - 2 /hpf   Epithelial Cells (non renal) None seen 0 - 10 /hpf   Bacteria, UA None seen None seen/Few  Urinalysis, Routine w reflex microscopic  Result Value Ref Range   Specific Gravity, UA 1.025 1.005 - 1.030   pH, UA 5.5 5.0 - 7.5   Color, UA Yellow Yellow   Appearance Ur Clear Clear   Leukocytes,UA Trace (A) Negative   Protein,UA Negative Negative/Trace   Glucose, UA Negative Negative   Ketones, UA Negative Negative   RBC, UA Trace (A) Negative   Bilirubin, UA Negative Negative   Urobilinogen, Ur 0.2 0.2 - 1.0 mg/dL   Nitrite, UA Negative Negative   Microscopic Examination See below:       Assessment & Plan:   Problem List Items Addressed This Visit       Other   COVID-19 virus RNA test result positive at limit of detection - Primary    Acute with symptoms starting today, tested positive at home.  Is aware to self quarantine for 5 days and then 5 days of masking.  Discussed Paxlovid, she wishes to hold off on this at this time -- but is aware she has a 5 day window to start, will alert provider if worsening symptoms over next day or two, will then send in if requested -- she is aware to hold Atorvastatin if  started on Paxlovid.  Script for [employer] sent in.  Recommend: - Increased rest - Increasing Fluids - Acetaminophen as needed for fever/pain.  - Salt water gargling, chloraseptic spray and throat lozenges - Mucinex.  - Humidifying the air.       I discussed the assessment and treatment plan with the patient. The patient was provided an opportunity to ask questions and all were answered. The patient agreed with the plan and demonstrated an understanding of the instructions.   The patient was advised to call back or seek an in-person evaluation if the symptoms worsen or if the condition fails  to improve as anticipated.   I provided 21+ minutes of time during this encounter.    Follow up plan: Return if symptoms worsen or fail to improve.

## 2023-04-05 ENCOUNTER — Ambulatory Visit: Payer: Medicare PPO | Admitting: Family Medicine

## 2023-04-05 ENCOUNTER — Encounter: Payer: Self-pay | Admitting: Nurse Practitioner

## 2023-04-11 DIAGNOSIS — I10 Essential (primary) hypertension: Secondary | ICD-10-CM | POA: Diagnosis not present

## 2023-04-11 DIAGNOSIS — I700 Atherosclerosis of aorta: Secondary | ICD-10-CM | POA: Diagnosis not present

## 2023-04-11 DIAGNOSIS — E782 Mixed hyperlipidemia: Secondary | ICD-10-CM | POA: Diagnosis not present

## 2023-04-11 DIAGNOSIS — I493 Ventricular premature depolarization: Secondary | ICD-10-CM | POA: Diagnosis not present

## 2023-04-11 DIAGNOSIS — E7800 Pure hypercholesterolemia, unspecified: Secondary | ICD-10-CM | POA: Diagnosis not present

## 2023-04-13 DIAGNOSIS — M5416 Radiculopathy, lumbar region: Secondary | ICD-10-CM | POA: Diagnosis not present

## 2023-04-13 DIAGNOSIS — M545 Low back pain, unspecified: Secondary | ICD-10-CM | POA: Diagnosis not present

## 2023-04-21 DIAGNOSIS — D485 Neoplasm of uncertain behavior of skin: Secondary | ICD-10-CM | POA: Diagnosis not present

## 2023-04-21 DIAGNOSIS — C44722 Squamous cell carcinoma of skin of right lower limb, including hip: Secondary | ICD-10-CM | POA: Diagnosis not present

## 2023-04-21 DIAGNOSIS — D2371 Other benign neoplasm of skin of right lower limb, including hip: Secondary | ICD-10-CM | POA: Diagnosis not present

## 2023-04-21 DIAGNOSIS — C44729 Squamous cell carcinoma of skin of left lower limb, including hip: Secondary | ICD-10-CM | POA: Diagnosis not present

## 2023-05-04 DIAGNOSIS — M5416 Radiculopathy, lumbar region: Secondary | ICD-10-CM | POA: Diagnosis not present

## 2023-05-04 DIAGNOSIS — M545 Low back pain, unspecified: Secondary | ICD-10-CM | POA: Diagnosis not present

## 2023-05-09 DIAGNOSIS — C44722 Squamous cell carcinoma of skin of right lower limb, including hip: Secondary | ICD-10-CM | POA: Diagnosis not present

## 2023-05-09 DIAGNOSIS — C44729 Squamous cell carcinoma of skin of left lower limb, including hip: Secondary | ICD-10-CM | POA: Diagnosis not present

## 2023-05-24 DIAGNOSIS — M5416 Radiculopathy, lumbar region: Secondary | ICD-10-CM | POA: Diagnosis not present

## 2023-05-24 DIAGNOSIS — M5451 Vertebrogenic low back pain: Secondary | ICD-10-CM | POA: Diagnosis not present

## 2023-06-02 DIAGNOSIS — M5451 Vertebrogenic low back pain: Secondary | ICD-10-CM | POA: Diagnosis not present

## 2023-06-02 DIAGNOSIS — M5416 Radiculopathy, lumbar region: Secondary | ICD-10-CM | POA: Diagnosis not present

## 2023-06-07 DIAGNOSIS — M5451 Vertebrogenic low back pain: Secondary | ICD-10-CM | POA: Diagnosis not present

## 2023-06-07 DIAGNOSIS — M5416 Radiculopathy, lumbar region: Secondary | ICD-10-CM | POA: Diagnosis not present

## 2023-06-15 NOTE — Patient Instructions (Signed)
Be Involved in Your Health Care:  Taking Medications When medications are taken as directed, they can greatly improve your health. But if they are not taken as instructed, they may not work. In some cases, not taking them correctly can be harmful. To help ensure your treatment remains effective and safe, understand your medications and how to take them.  Your lab results, notes and after visit summary will be available on My Chart. We strongly encourage you to use this feature. If lab results are abnormal the clinic will contact you with the appropriate steps. If the clinic does not contact you assume the results are satisfactory. You can always see your results on My Chart. If you have questions regarding your condition, please contact the clinic during office hours. You can also ask questions on My Chart.  We at [facility] are grateful that you chose us to provide care. We strive to provide excellent and compassionate care and are always looking for feedback. If you get a survey from the clinic please complete this.   Managing Anxiety, Adult After being diagnosed with anxiety, you may be relieved to know why you have felt or behaved a certain way. You may also feel overwhelmed about the treatment ahead and what it will mean for your life. With care and support, you can manage your anxiety. How to manage lifestyle changes Understanding the difference between stress and anxiety Although stress can play a role in anxiety, it is not the same as anxiety. Stress is your body's reaction to life changes and events, both good and bad. Stress is often caused by something external, such as a deadline, test, or competition. It normally goes away after the event has ended and will last just a few hours. But, stress can be ongoing and can lead to more than just stress. Anxiety is caused by something internal, such as imagining a terrible outcome or worrying that something will go wrong that will  greatly upset you. Anxiety often does not go away even after the event is over, and it can become a long-term (chronic) worry. Lowering stress and anxiety Talk with your health care provider or a counselor to learn more about lowering anxiety and stress. They may suggest tension-reduction techniques, such as: Music. Spend time creating or listening to music that you enjoy and that inspires you. Mindfulness-based meditation. Practice being aware of your normal breaths while not trying to control your breathing. It can be done while sitting or walking. Centering prayer. Focus on a word, phrase, or sacred image that means something to you and brings you peace. Deep breathing. Expand your stomach and inhale slowly through your nose. Hold your breath for 3-5 seconds. Then breathe out slowly, letting your stomach muscles relax. Self-talk. Learn to notice and spot thought patterns that lead to anxiety reactions. Change those patterns to thoughts that feel peaceful. Muscle relaxation. Take time to tense muscles and then relax them. Choose a tension-reduction technique that fits your lifestyle and personality. These techniques take time and practice. Set aside 5-15 minutes a day to do them. Specialized therapists can offer counseling and training in these techniques. The training to help with anxiety may be covered by some insurance plans. Other things you can do to manage stress and anxiety include: Keeping a stress diary. This can help you learn what triggers your reaction and then learn ways to manage your response. Thinking about how you react to certain situations. You may not be able to control everything,   but you can control your response. Making time for activities that help you relax and not feeling guilty about spending your time in this way. Doing visual imagery. This involves imagining or creating mental pictures to help you relax. Practicing yoga. Through yoga poses, you can lower tension and  relax.  Medicines Medicines for anxiety include: Antidepressant medicines. These are usually prescribed for long-term daily control. Anti-anxiety medicines. These may be added in severe cases, especially when panic attacks occur. When used together, medicines, psychotherapy, and tension-reduction techniques may be the most effective treatment. Relationships Relationships can play a big part in helping you recover. Spend more time connecting with trusted friends and family members. Think about going to couples counseling if you have a partner, taking family education classes, or going to family therapy. Therapy can help you and others better understand your anxiety. How to recognize changes in your anxiety Everyone responds differently to treatment for anxiety. Recovery from anxiety happens when symptoms lessen and stop interfering with your daily life at home or work. This may mean that you will start to: Have better concentration and focus. Worry will interfere less in your daily thinking. Sleep better. Be less irritable. Have more energy. Have improved memory. Try to recognize when your condition is getting worse. Contact your provider if your symptoms interfere with home or work and you feel like your condition is not improving. Follow these instructions at home: Activity Exercise. Adults should: Exercise for at least 150 minutes each week. The exercise should increase your heart rate and make you sweat (moderate-intensity exercise). Do strengthening exercises at least twice a week. Get the right amount and quality of sleep. Most adults need 7-9 hours of sleep each night. Lifestyle  Eat a healthy diet that includes plenty of vegetables, fruits, whole grains, low-fat dairy products, and lean protein. Do not eat a lot of foods that are high in fats, added sugars, or salt (sodium). Make choices that simplify your life. Do not use any products that contain nicotine or tobacco. These  products include cigarettes, chewing tobacco, and vaping devices, such as e-cigarettes. If you need help quitting, ask your provider. Avoid caffeine, alcohol, and certain over-the-counter cold medicines. These may make you feel worse. Ask your pharmacist which medicines to avoid. General instructions Take over-the-counter and prescription medicines only as told by your provider. Keep all follow-up visits. This is to make sure you are managing your anxiety well or if you need more support. Where to find support You can get help and support from: Self-help groups. Online and community organizations. A trusted spiritual leader. Couples counseling. Family education classes. Family therapy. Where to find more information You may find that joining a support group helps you deal with your anxiety. The following sources can help you find counselors or support groups near you: Mental Health America: mentalhealthamerica.net Anxiety and Depression Association of America (ADAA): adaa.org National Alliance on Mental Illness (NAMI): nami.org Contact a health care provider if: You have a hard time staying focused or finishing tasks. You spend many hours a day feeling worried about everyday life. You are very tired because you cannot stop worrying. You start to have headaches or often feel tense. You have chronic nausea or diarrhea. Get help right away if: Your heart feels like it is racing. You have shortness of breath. You have thoughts of hurting yourself or others. Get help right away if you feel like you may hurt yourself or others, or have thoughts about taking your own life.   Go to your nearest emergency room or: Call 911. Call the National Suicide Prevention Lifeline at 1-800-273-8255 or 988. This is open 24 hours a day. Text the Crisis Text Line at 741741. This information is not intended to replace advice given to you by your health care provider. Make sure you discuss any questions you have  with your health care provider. Document Revised: 09/14/2022 Document Reviewed: 03/29/2021 Elsevier Patient Education  2024 Elsevier Inc.  

## 2023-06-16 ENCOUNTER — Ambulatory Visit (INDEPENDENT_AMBULATORY_CARE_PROVIDER_SITE_OTHER): Payer: Medicare PPO | Admitting: Nurse Practitioner

## 2023-06-16 ENCOUNTER — Encounter: Payer: Self-pay | Admitting: Nurse Practitioner

## 2023-06-16 VITALS — BP 118/68 | HR 67 | Temp 98.0°F | Ht 65.25 in | Wt 147.6 lb

## 2023-06-16 DIAGNOSIS — E559 Vitamin D deficiency, unspecified: Secondary | ICD-10-CM | POA: Diagnosis not present

## 2023-06-16 DIAGNOSIS — Z0000 Encounter for general adult medical examination without abnormal findings: Secondary | ICD-10-CM

## 2023-06-16 DIAGNOSIS — E063 Autoimmune thyroiditis: Secondary | ICD-10-CM

## 2023-06-16 DIAGNOSIS — I700 Atherosclerosis of aorta: Secondary | ICD-10-CM | POA: Diagnosis not present

## 2023-06-16 DIAGNOSIS — G43119 Migraine with aura, intractable, without status migrainosus: Secondary | ICD-10-CM

## 2023-06-16 DIAGNOSIS — E538 Deficiency of other specified B group vitamins: Secondary | ICD-10-CM | POA: Diagnosis not present

## 2023-06-16 DIAGNOSIS — M810 Age-related osteoporosis without current pathological fracture: Secondary | ICD-10-CM | POA: Diagnosis not present

## 2023-06-16 DIAGNOSIS — E7800 Pure hypercholesterolemia, unspecified: Secondary | ICD-10-CM

## 2023-06-16 DIAGNOSIS — T466X5A Adverse effect of antihyperlipidemic and antiarteriosclerotic drugs, initial encounter: Secondary | ICD-10-CM

## 2023-06-16 DIAGNOSIS — F419 Anxiety disorder, unspecified: Secondary | ICD-10-CM | POA: Diagnosis not present

## 2023-06-16 DIAGNOSIS — I10 Essential (primary) hypertension: Secondary | ICD-10-CM

## 2023-06-16 DIAGNOSIS — I493 Ventricular premature depolarization: Secondary | ICD-10-CM | POA: Diagnosis not present

## 2023-06-16 DIAGNOSIS — M791 Myalgia, unspecified site: Secondary | ICD-10-CM

## 2023-06-16 DIAGNOSIS — Z79899 Other long term (current) drug therapy: Secondary | ICD-10-CM

## 2023-06-16 DIAGNOSIS — Z8589 Personal history of malignant neoplasm of other organs and systems: Secondary | ICD-10-CM | POA: Insufficient documentation

## 2023-06-16 MED ORDER — METOPROLOL SUCCINATE ER 50 MG PO TB24: ORAL_TABLET | ORAL | 4 refills | Status: DC

## 2023-06-16 MED ORDER — IBANDRONATE SODIUM 150 MG PO TABS: 150.0000 mg | ORAL_TABLET | ORAL | 0 refills | Status: DC

## 2023-06-16 MED ORDER — ATORVASTATIN CALCIUM 20 MG PO TABS: 20.0000 mg | ORAL_TABLET | Freq: Every day | ORAL | 4 refills | Status: DC

## 2023-06-16 MED ORDER — ALPRAZOLAM 0.25 MG PO TABS: 0.2500 mg | ORAL_TABLET | Freq: Every evening | ORAL | 0 refills | Status: DC | PRN

## 2023-06-16 MED ORDER — IBANDRONATE SODIUM 150 MG PO TABS: 150.0000 mg | ORAL_TABLET | ORAL | 4 refills | Status: DC

## 2023-06-16 MED ORDER — LEVOTHYROXINE SODIUM 75 MCG PO TABS: 75.0000 ug | ORAL_TABLET | Freq: Every day | ORAL | 4 refills | Status: DC

## 2023-06-16 NOTE — Assessment & Plan Note (Signed)
Ongoing, stable.  With her osteoporosis recommend she continue daily supplement 2000 units and will recheck today.  Repeat DEXA in 2025.

## 2023-06-16 NOTE — Assessment & Plan Note (Addendum)
Tolerating Atorvastatin at this time, continue this and check lipid panel today.

## 2023-06-16 NOTE — Assessment & Plan Note (Signed)
Chronic, stable.  BP well below goal.  Goal <130/80.  Recommend she monitor BP at least a few mornings a week at home and document.  DASH diet at home.  Continue current medication regimen and adjust as needed.  Labs today: CMP, TSH, Lipid, CBC.  Return in one year, sooner if any changes present.

## 2023-06-16 NOTE — Assessment & Plan Note (Signed)
Chronic, ongoing.  Continue current medication regimen and adjust as needed. Lipid panel today. 

## 2023-06-16 NOTE — Assessment & Plan Note (Signed)
Chronic, ongoing.  Continue minimal use of Xanax, educated her at length on risk of long term use, however 30 pills has lasted her one year.  Using appropriately.  She denies SI/HI. Obtain UDS and controlled substance contract next visit.

## 2023-06-16 NOTE — Assessment & Plan Note (Signed)
Chronic, ongoing.  Noted on imaging 03/17/20.  At this time continue Atorvastatin as ordered. Educated patient.

## 2023-06-16 NOTE — Progress Notes (Signed)
BP 118/68 (BP Location: Left Arm, Patient Position: Sitting, Cuff Size: Normal)   Pulse 67   Temp 98 F (36.7 C) (Oral)   Ht 5' 5.25" (1.657 m)   Wt 147 lb 9.6 oz (67 kg)   LMP  (LMP Unknown)   SpO2 97%   BMI 24.37 kg/m    Subjective:    Patient ID: Savannah [name], female    DOB: 01-Oct-1946, 77 y.o.   MRN: [id number]  HPI: Savannah [name] is a 77 y.o. female presenting on 06/16/2023 for Medicare wellness and comprehensive medical examination. Current medical complaints include:none  She currently lives with: spouse Menopausal Symptoms: no   HYPERTENSION / HYPERLIPIDEMIA Followed by cardiology and last saw 04/11/23.  Continues on Metoprolol XL 25 MG daily and Atorvastatin. Tried 3 different statins in past and all caused headaches. Satisfied with current treatment? yes Duration of hypertension: chronic BP monitoring frequency: rarely BP range: 110-140/60-70 BP medication side effects: no Duration of hyperlipidemia: chronic Cholesterol medication side effects: no Cholesterol supplements: none Medication compliance: good compliance Aspirin: no Recent stressors: no Recurrent headaches: yes, has migraines underlying  Visual changes: no Palpitations: no Dyspnea: no Chest pain: no Lower extremity edema:  occasional Dizzy/lightheaded: no  The 10-year ASCVD risk score (Arnett DK, et al., 2019) is: 33.4%   Values used to calculate the score:     Age: 28 years     Sex: Female     Is Non-Hispanic African American: No     Diabetic: No     Tobacco smoker: Yes     Systolic Blood Pressure: 118 mmHg     Is BP treated: Yes     HDL Cholesterol: 73 mg/dL     Total Cholesterol: 174 mg/dL  OSTEOPOROSIS Last DEXA 08/02/22 -- T-score -3.0, continues on Boniva. Similar to previous DEXA. Satisfied with current treatment?: yes Medication side effects: no Medication compliance: good compliance Past osteoporosis medications/treatments: no Adequate calcium & vitamin D: yes Intolerance to  bisphosphonates:no Weight bearing exercises: yes  HYPOTHYROIDISM Continues on Levothyroxine 75 MCG daily.   Thyroid control status:stable Satisfied with current treatment? yes Medication side effects: no Medication compliance: good compliance Etiology of hypothyroidism:  Recent dose adjustment:no Fatigue: no Cold intolerance: no Heat intolerance: no Weight gain: no Weight loss: no Constipation: no Diarrhea/loose stools: no Palpitations: no Lower extremity edema: no Anxiety/depressed mood: no   MIGRAINES Follows with Dr. Sherryll Burger at neurology -- has followed him for years -- last visit was 10/16/21.  Received injections in past.  MRI brain last in January 2022 with no acute findings. Taking B12 supplement orally.   Duration: chronic Onset: sudden Severity: mild Quality: aching Frequency: occasional Radiation: no Headache status at time of visit: asymptomatic Treatments attempted: Metoprolol, Amlodipine, Ajovy, nerve block Aura: no Nausea:  no Vomiting: no Photophobia:  no Phonophobia:  no Effect on social functioning:  no Numbers of missed days of school/work each month:  Confusion:  no Gait disturbance/ataxia:  no Behavioral changes:  no Fevers:  no   ANXIETY/STRESS Taking Xanax 0.25 MG as needed daily for anxiety -- last fill 08/16/22 on PDMP review.  Lexapro caused diarrhea in past.  Pt is aware of risks of benzo medication use to include increased sedation, respiratory suppression, falls, dependence and cardiovascular events.  Pt would like to continue treatment as benefit determined to outweigh risk -- she has been on Xanax since her 30's. Mood status: stable Satisfied with current treatment?: yes Symptom severity: mild  Duration of current  treatment : chronic Side effects: no Medication compliance: excellent compliance Psychotherapy/counseling: no Depressed mood: no Anxious mood: no Anhedonia: no Significant weight loss or gain: no Insomnia: no  Fatigue:  no Feelings of worthlessness or guilt: no Impaired concentration/indecisiveness: no Suicidal ideations: no Hopelessness: no Crying spells: no    06/16/2023   10:19 AM 04/04/2023    3:22 PM 12/29/2022    2:16 PM 10/08/2022    8:27 AM 09/06/2022   11:12 AM  Depression screen PHQ 2/9  Decreased Interest 0 0 0 0 0  Down, Depressed, Hopeless 0 0 0 0 0  PHQ - 2 Score 0 0 0 0 0  Altered sleeping 0 0 0 0 0  Tired, decreased energy 0 0 0 0 2  Change in appetite 0 0 0 0 2  Feeling bad or failure about yourself  0 0 0 0 0  Trouble concentrating 0 0 0 0 0  Moving slowly or fidgety/restless 0 0 0 0 0  Suicidal thoughts 0 0 0 0 0  PHQ-9 Score 0 0 0 0 4  Difficult doing work/chores   Not difficult at all Not difficult at all Not difficult at all       06/16/2023   10:20 AM 04/04/2023    3:22 PM 12/29/2022    2:16 PM 10/08/2022    8:27 AM  GAD 7 : Generalized Anxiety Score  Nervous, Anxious, on Edge 0 0 0 0  Control/stop worrying 0 0 0 0  Worry too much - different things 0 0 0 0  Trouble relaxing 0 0 0 0  Restless 0 0 0 0  Easily annoyed or irritable 0 0 0 0  Afraid - awful might happen 0 0 0 0  Total GAD 7 Score 0 0 0 0  Anxiety Difficulty Not difficult at all Not difficult at all Not difficult at all Not difficult at all        09/06/2022   11:12 AM 10/08/2022    8:27 AM 12/29/2022    2:14 PM 04/04/2023    3:21 PM 06/16/2023   10:18 AM  Fall Risk  Falls in the past year? 0 0 0 0 0  Was there an injury with Fall? 0 0 0 0 0  Fall Risk Category Calculator 0 0 0 0 0  Fall Risk Category (Retired) Low Low Low    (RETIRED) Patient Fall Risk Level Low fall risk Low fall risk     Patient at Risk for Falls Due to No Fall Risks No Fall Risks No Fall Risks No Fall Risks No Fall Risks  Fall risk Follow up Falls evaluation completed Falls evaluation completed Falls evaluation completed Falls evaluation completed Falls evaluation completed    Functional Status Survey: Is the patient deaf or  have difficulty hearing?: No Does the patient have difficulty seeing, even when wearing glasses/contacts?: No Does the patient have difficulty concentrating, remembering, or making decisions?: No Does the patient have difficulty walking or climbing stairs?: No Does the patient have difficulty dressing or bathing?: No Does the patient have difficulty doing errands alone such as visiting a doctor's office or shopping?: No    Past Medical History:  Past Medical History:  Diagnosis Date   Anxiety    Cancer (HCC)    skin- squamous   HOH (hard of hearing)    WEARS AIDS   Hyperlipidemia    Hypertension    CONTROLLED ON MEDS   Hypothyroidism    IBS (irritable bowel syndrome)  Migraine    MVP (mitral valve prolapse)    OA (osteoarthritis) of neck    JOINTS   Osteoporosis    Thyroid disease     Surgical History:  Past Surgical History:  Procedure Laterality Date   ANKLE FRACTURE SURGERY Left    BREAST CYST ASPIRATION Right 90s   COLONOSCOPY WITH PROPOFOL N/A 04/12/2016   Procedure: COLONOSCOPY WITH PROPOFOL;  Surgeon: Scot Jun, MD;  Location: [facility] ENDOSCOPY;  Service: Endoscopy;  Laterality: N/A;   FRACTURE SURGERY     ANKLE   HAMMER TOE SURGERY Right 02/08/2018   Procedure: HAMMER TOE CORRECTION-5TH TOE, Excision soft tissue mass dorsal lateral right fifth toe and exostectomy dorsal lateral DIPJ fifth toe;  Surgeon: Gwyneth Revels, DPM;  Location: [facility];  Service: Podiatry;  Laterality: Right;  IVA LOCAL   moses surgery  09/2015   SINUS SURGERY WITH INSTATRAK  10/2017   DEVIATED SEPTUM   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   clobetasol ointment (TEMOVATE) 0.05 % Apply 1 application topically 2 (two) times daily.   fluocinonide (LIDEX) 0.05 % external solution Apply  topically.   scopolamine (TRANSDERM-SCOP) 1 MG/3DAYS Place 1 patch (1.5 mg total) onto the skin every 3 (three) days.   vitamin B-12 (CYANOCOBALAMIN) 1000 MCG tablet Take 1,000 mcg by mouth daily.   No current facility-administered medications on file prior to visit.    Allergies:  Allergies  Allergen Reactions   Amoxil [Amoxicillin] Itching   Penicillins Other (See Comments)    "passed out"    Social History:  Social History   Socioeconomic History   Marital status: Married    Spouse name: Not on file   Number of children: Not on file   Years of education: Not on file   Highest education level: Master's degree (e.g., MA, MS, MEng, MEd, MSW, MBA)  Occupational History   Occupation: retired  Tobacco Use   Smoking status: Former    Packs/day: 1.00    Years: 4.00    Additional pack years: 0.00    Total pack years: 4.00    Types: Cigarettes    Quit date: 1970    Years since quitting: 54.5   Smokeless tobacco: Never  Vaping Use   Vaping Use: Never used  Substance and Sexual Activity   Alcohol use: Yes    Alcohol/week: 10.0 standard drinks of alcohol    Types: 10 Glasses of wine per week   Drug use: No   Sexual activity: Yes    Birth control/protection: None    Comment: Married  Other Topics Concern   Not on file  Social History Narrative   Not on file   Social Determinants of Health   Financial Resource Strain: Low Risk  (06/16/2023)   Overall Financial Resource Strain (CARDIA)    Difficulty of Paying Living Expenses: Not hard at all  Food Insecurity: No Food Insecurity (06/16/2023)   Hunger Vital Sign    Worried About Running Out of Food in the Last Year: Never true    Ran Out of Food in the Last Year: Never true  Transportation Needs: No Transportation Needs (06/16/2023)   PRAPARE - Administrator, Civil Service (Medical): No    Lack of Transportation (Non-Medical): No  Physical Activity: Sufficiently Active (06/16/2023)  Exercise Vital Sign     Days of Exercise per Week: 5 days    Minutes of Exercise per Session: 30 min  Stress: No Stress Concern Present (06/16/2023)   [employer] of Occupational Health - Occupational Stress Questionnaire    Feeling of Stress : Not at all  Social Connections: Moderately Integrated (06/16/2023)   Social Connection and Isolation Panel [NHANES]    Frequency of Communication with Friends and Family: More than three times a week    Frequency of Social Gatherings with Friends and Family: More than three times a week    Attends Religious Services: More than 4 times per year    Active Member of [employer] or Organizations: No    Attends Banker Meetings: Never    Marital Status: Married  Catering manager Violence: Not At Risk (06/16/2023)   Humiliation, Afraid, Rape, and Kick questionnaire    Fear of Current or Ex-Partner: No    Emotionally Abused: No    Physically Abused: No    Sexually Abused: No   Social History   Tobacco Use  Smoking Status Former   Packs/day: 1.00   Years: 4.00   Additional pack years: 0.00   Total pack years: 4.00   Types: Cigarettes   Quit date: 1970   Years since quitting: 54.5  Smokeless Tobacco Never   Social History   Substance and Sexual Activity  Alcohol Use Yes   Alcohol/week: 10.0 standard drinks of alcohol   Types: 10 Glasses of wine per week    Family History:  Family History  Problem Relation Age of Onset   Hypertension Mother    Colon cancer Mother    Lung cancer Father    Brain cancer Father    Hyperlipidemia Brother    Breast cancer Neg Hx     Past medical history, surgical history, medications, allergies, family history and social history reviewed with patient today and changes made to appropriate areas of the chart.   ROS All other ROS negative except what is listed above and in the HPI.      Objective:    BP 118/68 (BP Location: Left Arm, Patient Position: Sitting, Cuff Size: Normal)   Pulse 67   Temp 98 F (36.7 C)  (Oral)   Ht 5' 5.25" (1.657 m)   Wt 147 lb 9.6 oz (67 kg)   LMP  (LMP Unknown)   SpO2 97%   BMI 24.37 kg/m   Wt Readings from Last 3 Encounters:  06/16/23 147 lb 9.6 oz (67 kg)  12/29/22 156 lb 9.6 oz (71 kg)  10/08/22 150 lb 11.2 oz (68.4 kg)    Physical Exam Vitals and nursing note reviewed. Exam conducted with a chaperone present.  Constitutional:      General: She is awake. She is not in acute distress.    Appearance: She is well-developed and well-groomed. She is not ill-appearing or toxic-appearing.  HENT:     Head: Normocephalic and atraumatic.     Right Ear: Hearing, tympanic membrane, ear canal and external ear normal. No drainage.     Left Ear: Hearing, tympanic membrane, ear canal and external ear normal. No drainage.     Nose: Nose normal.     Right Sinus: No maxillary sinus tenderness or frontal sinus tenderness.     Left Sinus: No maxillary sinus tenderness or frontal sinus tenderness.     Mouth/Throat:     Mouth: Mucous membranes are moist.     Pharynx: Oropharynx is clear.  Uvula midline. No pharyngeal swelling, oropharyngeal exudate or posterior oropharyngeal erythema.  Eyes:     General: Lids are normal.        Right eye: No discharge.        Left eye: No discharge.     Extraocular Movements: Extraocular movements intact.     Conjunctiva/sclera: Conjunctivae normal.     Pupils: Pupils are equal, round, and reactive to light.     Visual Fields: Right eye visual fields normal and left eye visual fields normal.  Neck:     Thyroid: No thyromegaly.     Vascular: No carotid bruit.     Trachea: Trachea normal.  Cardiovascular:     Rate and Rhythm: Normal rate and regular rhythm.     Heart sounds: Normal heart sounds. No murmur heard.    No gallop.  Pulmonary:     Effort: Pulmonary effort is normal. No accessory muscle usage or respiratory distress.     Breath sounds: Normal breath sounds.  Chest:  Breasts:    Right: Normal.     Left: Normal.  Abdominal:      General: Bowel sounds are normal.     Palpations: Abdomen is soft. There is no hepatomegaly or splenomegaly.     Tenderness: There is no abdominal tenderness.  Musculoskeletal:        General: Normal range of motion.     Cervical back: Normal range of motion and neck supple.     Right lower leg: No edema.     Left lower leg: No edema.  Lymphadenopathy:     Head:     Right side of head: No submental, submandibular, tonsillar, preauricular or posterior auricular adenopathy.     Left side of head: No submental, submandibular, tonsillar, preauricular or posterior auricular adenopathy.     Cervical: No cervical adenopathy.     Upper Body:     Right upper body: No supraclavicular, axillary or pectoral adenopathy.     Left upper body: No supraclavicular, axillary or pectoral adenopathy.  Skin:    General: Skin is warm and dry.     Capillary Refill: Capillary refill takes less than 2 seconds.     Findings: No rash.  Neurological:     Mental Status: She is alert and oriented to person, place, and time.     Gait: Gait is intact.     Deep Tendon Reflexes: Reflexes are normal and symmetric.     Reflex Scores:      Brachioradialis reflexes are 2+ on the right side and 2+ on the left side.      Patellar reflexes are 2+ on the right side and 2+ on the left side. Psychiatric:        Attention and Perception: Attention normal.        Mood and Affect: Mood normal.        Speech: Speech normal.        Behavior: Behavior normal. Behavior is cooperative.        Thought Content: Thought content normal.        Judgment: Judgment normal.       06/16/2023   11:16 AM 05/15/2021    9:48 AM 05/12/2020   10:07 AM 05/10/2019    2:47 PM 05/01/2018    2:00 PM  6CIT Screen  What Year? 0 points 0 points 0 points 0 points 0 points  What month? 0 points 0 points 0 points 0 points 0 points  What time? 0 points 0  points 0 points 0 points 0 points  Count back from 20 0 points 0 points 0 points 0 points 0 points   Months in reverse 0 points 0 points 0 points 0 points 0 points  Repeat phrase 0 points 0 points 0 points 0 points 0 points  Total Score 0 points 0 points 0 points 0 points 0 points   Results for orders placed or performed in visit on 10/08/22  WET PREP FOR TRICH, YEAST, CLUE   Specimen: Urine   Urine  Result Value Ref Range   Trichomonas Exam Negative Negative   Yeast Exam Negative Negative   Clue Cell Exam Negative Negative  Urine Culture   Specimen: Urine   UR  Result Value Ref Range   Urine Culture, Routine Final report    Organism ID, Bacteria Comment   Microscopic Examination   Urine  Result Value Ref Range   WBC, UA 0-5 0 - 5 /hpf   RBC, Urine 0-2 0 - 2 /hpf   Epithelial Cells (non renal) None seen 0 - 10 /hpf   Bacteria, UA None seen None seen/Few  Urinalysis, Routine w reflex microscopic  Result Value Ref Range   Specific Gravity, UA 1.025 1.005 - 1.030   pH, UA 5.5 5.0 - 7.5   Color, UA Yellow Yellow   Appearance Ur Clear Clear   Leukocytes,UA Trace (A) Negative   Protein,UA Negative Negative/Trace   Glucose, UA Negative Negative   Ketones, UA Negative Negative   RBC, UA Trace (A) Negative   Bilirubin, UA Negative Negative   Urobilinogen, Ur 0.2 0.2 - 1.0 mg/dL   Nitrite, UA Negative Negative   Microscopic Examination See below:       Assessment & Plan:   Problem List Items Addressed This Visit       Cardiovascular and Mediastinum   Aortic atherosclerosis (HCC)    Chronic, ongoing.  Noted on imaging 03/17/20.  At this time continue Atorvastatin as ordered. Educated patient.      Relevant Medications   metoprolol succinate (TOPROL-XL) 50 MG 24 hr tablet   atorvastatin (LIPITOR) 20 MG tablet   Other Relevant Orders   Comprehensive metabolic panel   Lipid Panel w/o Chol/HDL Ratio   Essential hypertension    Chronic, stable.  BP well below goal.  Goal <130/80.  Recommend she monitor BP at least a few mornings a week at home and document.  DASH diet at  home.  Continue current medication regimen and adjust as needed.  Labs today: CMP, TSH, Lipid, CBC.  Return in one year, sooner if any changes present.       Relevant Medications   metoprolol succinate (TOPROL-XL) 50 MG 24 hr tablet   atorvastatin (LIPITOR) 20 MG tablet   Other Relevant Orders   CBC with Differential/Platelet   Comprehensive metabolic panel   TSH   Frequent PVCs    Chronic, stable.  Continue collaboration with cardiology and Metoprolol.  Last Holter performed 10/14/20.      Relevant Medications   metoprolol succinate (TOPROL-XL) 50 MG 24 hr tablet   atorvastatin (LIPITOR) 20 MG tablet   Intractable migraine with aura without status migrainosus    Chronic, stable.  Well-controlled at this time.  Continue collaboration with neurology as needed and current medication regimen.      Relevant Medications   metoprolol succinate (TOPROL-XL) 50 MG 24 hr tablet   atorvastatin (LIPITOR) 20 MG tablet     Endocrine   Hashimoto's thyroiditis  Chronic, ongoing.  Continue current medication regimen and adjust as needed.  Obtain TSH and Free T4 today.         Relevant Medications   levothyroxine (SYNTHROID) 75 MCG tablet   metoprolol succinate (TOPROL-XL) 50 MG 24 hr tablet   Other Relevant Orders   TSH   T4, free     Musculoskeletal and Integument   Age-related osteoporosis without current pathological fracture   Relevant Medications   ibandronate (BONIVA) 150 MG tablet   Other Relevant Orders   VITAMIN D 25 Hydroxy (Vit-D Deficiency, Fractures)     Other   Chronic anxiety    Chronic, ongoing.  Continue minimal use of Xanax, educated her at length on risk of long term use, however 30 pills has lasted her one year.  Using appropriately.  She denies SI/HI. Obtain UDS and controlled substance contract next visit.      Relevant Medications   ALPRAZolam (XANAX) 0.25 MG tablet   Hyperlipidemia    Chronic, ongoing.  Continue current medication regimen and adjust as  needed.  Lipid panel today.      Relevant Medications   metoprolol succinate (TOPROL-XL) 50 MG 24 hr tablet   atorvastatin (LIPITOR) 20 MG tablet   Other Relevant Orders   Comprehensive metabolic panel   Lipid Panel w/o Chol/HDL Ratio   Long term prescription benzodiazepine use    Has used Xanax since her 42's -- discussed with her today and goal is to reduce and discontinue in future..  UDS and contract next visit.      Myalgia due to statin    Tolerating Atorvastatin at this time, continue this and check lipid panel today.      Vitamin B12 deficiency    Ongoing, stable.  Continue daily oral supplement and check level today.  Previously received injections, but is finding similar benefit from oral supplement.      Relevant Orders   Vitamin B12   Vitamin D deficiency    Ongoing, stable.  With her osteoporosis recommend she continue daily supplement 2000 units and will recheck today.  Repeat DEXA in 2025.      Relevant Orders   VITAMIN D 25 Hydroxy (Vit-D Deficiency, Fractures)   Other Visit Diagnoses     Medicare annual wellness visit, subsequent    -  Primary   Medicare wellness due and performed with patient in office today.   Encounter for annual physical exam       Annual physical today with labs and health maintenance reviewed, discussed with patient.        Follow up plan: Return in about 1 year (around 06/15/2024) for Annual Exam.   LABORATORY TESTING:  - Pap smear: not applicable  IMMUNIZATIONS:   - Tetanus vaccination status reviewed: last tetanus booster within 10 years. - Influenza: Up to date - Pneumovax: Up to date - Prevnar: Up to date - HPV: Not applicable - Zostavax vaccine:   SCREENING: -Mammogram: Up To Date - Colonoscopy: Up to date -- she would like to continue these, next would be 11/22/2024  - Bone Density: Up To Date -Hearing Test: Not applicable  -Spirometry: Not applicable   PATIENT COUNSELING:   Advised to take 1 mg of folate  supplement per day if capable of pregnancy.   Sexuality: Discussed sexually transmitted diseases, partner selection, use of condoms, avoidance of unintended pregnancy  and contraceptive alternatives.   Advised to avoid cigarette smoking.  I discussed with the patient that most people either abstain from  alcohol or drink within safe limits (<=14/week and <=4 drinks/occasion for males, <=7/weeks and <= 3 drinks/occasion for females) and that the risk for alcohol disorders and other health effects rises proportionally with the number of drinks per week and how often a drinker exceeds daily limits.  Discussed cessation/primary prevention of drug use and availability of treatment for abuse.   Diet: Encouraged to adjust caloric intake to maintain  or achieve ideal body weight, to reduce intake of dietary saturated fat and total fat, to limit sodium intake by avoiding high sodium foods and not adding table salt, and to maintain adequate dietary potassium and calcium preferably from fresh fruits, vegetables, and low-fat dairy products.    Stressed the importance of regular exercise  Injury prevention: Discussed safety belts, safety helmets, smoke detector, smoking near bedding or upholstery.   Dental health: Discussed importance of regular tooth brushing, flossing, and dental visits.    NEXT PREVENTATIVE PHYSICAL DUE IN 1 YEAR. Return in about 1 year (around 06/15/2024) for Annual Exam.

## 2023-06-16 NOTE — Assessment & Plan Note (Signed)
Chronic, stable.  Continue collaboration with cardiology and Metoprolol.  Last Holter performed 10/14/20.

## 2023-06-16 NOTE — Assessment & Plan Note (Signed)
Chronic, ongoing.  Continue current medication regimen and adjust as needed.  Obtain TSH and Free T4 today.    

## 2023-06-16 NOTE — Assessment & Plan Note (Signed)
Ongoing, stable.  Continue daily oral supplement and check level today.  Previously received injections, but is finding similar benefit from oral supplement.

## 2023-06-16 NOTE — Assessment & Plan Note (Signed)
Has used Xanax since her 46's -- discussed with her today and goal is to reduce and discontinue in future..  UDS and contract next visit.

## 2023-06-16 NOTE — Assessment & Plan Note (Signed)
Chronic, stable.  Well-controlled at this time.  Continue collaboration with neurology as needed and current medication regimen.

## 2023-06-17 LAB — CBC WITH DIFFERENTIAL/PLATELET
Basophils Absolute: 0 10*3/uL (ref 0.0–0.2)
Basos: 1 %
EOS (ABSOLUTE): 0.1 10*3/uL (ref 0.0–0.4)
Eos: 2 %
Hematocrit: 43.4 % (ref 34.0–46.6)
Hemoglobin: 14.4 g/dL (ref 11.1–15.9)
Immature Grans (Abs): 0 10*3/uL (ref 0.0–0.1)
Immature Granulocytes: 0 %
Lymphocytes Absolute: 1.6 10*3/uL (ref 0.7–3.1)
Lymphs: 25 %
MCH: 31.9 pg (ref 26.6–33.0)
MCHC: 33.2 g/dL (ref 31.5–35.7)
MCV: 96 fL (ref 79–97)
Monocytes Absolute: 0.6 10*3/uL (ref 0.1–0.9)
Monocytes: 9 %
Neutrophils Absolute: 4.1 10*3/uL (ref 1.4–7.0)
Neutrophils: 63 %
Platelets: 201 10*3/uL (ref 150–450)
RBC: 4.52 x10E6/uL (ref 3.77–5.28)
RDW: 12.4 % (ref 11.7–15.4)
WBC: 6.4 10*3/uL (ref 3.4–10.8)

## 2023-06-17 LAB — COMPREHENSIVE METABOLIC PANEL
ALT: 12 IU/L (ref 0–32)
AST: 19 IU/L (ref 0–40)
Albumin: 4.3 g/dL (ref 3.8–4.8)
Alkaline Phosphatase: 59 IU/L (ref 44–121)
BUN/Creatinine Ratio: 11 — ABNORMAL LOW (ref 12–28)
BUN: 8 mg/dL (ref 8–27)
Bilirubin Total: 0.6 mg/dL (ref 0.0–1.2)
CO2: 26 mmol/L (ref 20–29)
Calcium: 9.7 mg/dL (ref 8.7–10.3)
Chloride: 102 mmol/L (ref 96–106)
Creatinine, Ser: 0.76 mg/dL (ref 0.57–1.00)
Globulin, Total: 2 g/dL (ref 1.5–4.5)
Glucose: 81 mg/dL (ref 70–99)
Potassium: 4.1 mmol/L (ref 3.5–5.2)
Sodium: 141 mmol/L (ref 134–144)
Total Protein: 6.3 g/dL (ref 6.0–8.5)
eGFR: 80 mL/min/{1.73_m2} (ref >59)

## 2023-06-17 LAB — T4, FREE: Free T4: 1.5 ng/dL (ref 0.82–1.77)

## 2023-06-17 LAB — LIPID PANEL W/O CHOL/HDL RATIO
Cholesterol, Total: 184 mg/dL (ref 100–199)
HDL: 67 mg/dL (ref >39)
LDL Chol Calc (NIH): 95 mg/dL (ref 0–99)
Triglycerides: 129 mg/dL (ref 0–149)
VLDL Cholesterol Cal: 22 mg/dL (ref 5–40)

## 2023-06-17 LAB — VITAMIN B12: Vitamin B-12: 1243 pg/mL (ref 232–1245)

## 2023-06-17 LAB — TSH: TSH: 2.26 u[IU]/mL (ref 0.450–4.500)

## 2023-06-17 LAB — VITAMIN D 25 HYDROXY (VIT D DEFICIENCY, FRACTURES): Vit D, 25-Hydroxy: 48.2 ng/mL (ref 30.0–100.0)

## 2023-06-17 NOTE — Progress Notes (Signed)
Contacted via MyChart   Good afternoon Daliyah, your labs have returned and as usual they look fabulous!!  I have no concerns on these, continue all current medications.  Any questions? Keep being inspiring!!  Thank you for allowing me to participate in your care.  I appreciate you. Kindest regards, Marianita Botkin

## 2023-06-20 ENCOUNTER — Other Ambulatory Visit: Payer: Self-pay | Admitting: Nurse Practitioner

## 2023-06-20 DIAGNOSIS — Z1231 Encounter for screening mammogram for malignant neoplasm of breast: Secondary | ICD-10-CM

## 2023-07-25 DIAGNOSIS — D2261 Melanocytic nevi of right upper limb, including shoulder: Secondary | ICD-10-CM | POA: Diagnosis not present

## 2023-07-25 DIAGNOSIS — D485 Neoplasm of uncertain behavior of skin: Secondary | ICD-10-CM | POA: Diagnosis not present

## 2023-07-25 DIAGNOSIS — D2271 Melanocytic nevi of right lower limb, including hip: Secondary | ICD-10-CM | POA: Diagnosis not present

## 2023-07-25 DIAGNOSIS — L820 Inflamed seborrheic keratosis: Secondary | ICD-10-CM | POA: Diagnosis not present

## 2023-07-25 DIAGNOSIS — Z85828 Personal history of other malignant neoplasm of skin: Secondary | ICD-10-CM | POA: Diagnosis not present

## 2023-07-25 DIAGNOSIS — D2262 Melanocytic nevi of left upper limb, including shoulder: Secondary | ICD-10-CM | POA: Diagnosis not present

## 2023-07-25 DIAGNOSIS — L538 Other specified erythematous conditions: Secondary | ICD-10-CM | POA: Diagnosis not present

## 2023-07-25 DIAGNOSIS — C44729 Squamous cell carcinoma of skin of left lower limb, including hip: Secondary | ICD-10-CM | POA: Diagnosis not present

## 2023-07-25 DIAGNOSIS — D225 Melanocytic nevi of trunk: Secondary | ICD-10-CM | POA: Diagnosis not present

## 2023-08-08 ENCOUNTER — Ambulatory Visit
Admission: RE | Admit: 2023-08-08 | Discharge: 2023-08-08 | Disposition: A | Discharge status: Home / Self Care | Payer: Medicare PPO | Source: Ambulatory Visit | Attending: Nurse Practitioner | Admitting: Nurse Practitioner

## 2023-08-08 DIAGNOSIS — Z1231 Encounter for screening mammogram for malignant neoplasm of breast: Secondary | ICD-10-CM | POA: Insufficient documentation

## 2023-08-09 NOTE — Progress Notes (Signed)
Contacted via MyChart   Normal mammogram, may repeat in one year:)

## 2023-08-29 DIAGNOSIS — M79671 Pain in right foot: Secondary | ICD-10-CM | POA: Diagnosis not present

## 2023-08-29 DIAGNOSIS — M7661 Achilles tendinitis, right leg: Secondary | ICD-10-CM | POA: Diagnosis not present

## 2023-08-29 DIAGNOSIS — K219 Gastro-esophageal reflux disease without esophagitis: Secondary | ICD-10-CM | POA: Diagnosis not present

## 2023-08-29 DIAGNOSIS — M7731 Calcaneal spur, right foot: Secondary | ICD-10-CM | POA: Diagnosis not present

## 2023-09-07 DIAGNOSIS — C44729 Squamous cell carcinoma of skin of left lower limb, including hip: Secondary | ICD-10-CM | POA: Diagnosis not present

## 2023-09-07 DIAGNOSIS — L905 Scar conditions and fibrosis of skin: Secondary | ICD-10-CM | POA: Diagnosis not present

## 2023-09-08 ENCOUNTER — Other Ambulatory Visit: Payer: Self-pay | Admitting: Nurse Practitioner

## 2023-09-09 ENCOUNTER — Emergency Department
Admission: EM | Admit: 2023-09-09 | Discharge: 2023-09-09 | Discharge status: Against Medical Advice | Payer: Medicare PPO | Attending: Emergency Medicine | Admitting: Emergency Medicine

## 2023-09-09 ENCOUNTER — Emergency Department: Payer: Medicare PPO

## 2023-09-09 ENCOUNTER — Ambulatory Visit: Admission: RE | Admit: 2023-09-09 | Payer: Medicare PPO | Source: Ambulatory Visit

## 2023-09-09 DIAGNOSIS — R42 Dizziness and giddiness: Secondary | ICD-10-CM | POA: Diagnosis not present

## 2023-09-09 DIAGNOSIS — I10 Essential (primary) hypertension: Secondary | ICD-10-CM | POA: Diagnosis not present

## 2023-09-09 DIAGNOSIS — E039 Hypothyroidism, unspecified: Secondary | ICD-10-CM | POA: Insufficient documentation

## 2023-09-09 DIAGNOSIS — Z5321 Procedure and treatment not carried out due to patient leaving prior to being seen by health care provider: Secondary | ICD-10-CM | POA: Insufficient documentation

## 2023-09-09 DIAGNOSIS — R519 Headache, unspecified: Secondary | ICD-10-CM | POA: Diagnosis not present

## 2023-09-09 DIAGNOSIS — R29818 Other symptoms and signs involving the nervous system: Secondary | ICD-10-CM | POA: Diagnosis not present

## 2023-09-09 DIAGNOSIS — I6523 Occlusion and stenosis of bilateral carotid arteries: Secondary | ICD-10-CM | POA: Diagnosis not present

## 2023-09-09 LAB — CBC WITH DIFFERENTIAL/PLATELET
Abs Immature Granulocytes: 0.05 10*3/uL (ref 0.00–0.07)
Basophils Absolute: 0 10*3/uL (ref 0.0–0.1)
Basophils Relative: 1 %
Eosinophils Absolute: 0.1 10*3/uL (ref 0.0–0.5)
Eosinophils Relative: 2 %
HCT: 45.3 % (ref 36.0–46.0)
Hemoglobin: 15.1 g/dL — ABNORMAL HIGH (ref 12.0–15.0)
Immature Granulocytes: 1 %
Lymphocytes Relative: 18 %
Lymphs Abs: 1.1 10*3/uL (ref 0.7–4.0)
MCH: 32.1 pg (ref 26.0–34.0)
MCHC: 33.3 g/dL (ref 30.0–36.0)
MCV: 96.2 fL (ref 80.0–100.0)
Monocytes Absolute: 0.8 10*3/uL (ref 0.1–1.0)
Monocytes Relative: 13 %
Neutro Abs: 4.1 10*3/uL (ref 1.7–7.7)
Neutrophils Relative %: 65 %
Platelets: 186 10*3/uL (ref 150–400)
RBC: 4.71 MIL/uL (ref 3.87–5.11)
RDW: 12.1 % (ref 11.5–15.5)
WBC: 6.2 10*3/uL (ref 4.0–10.5)
nRBC: 0 % (ref 0.0–0.2)

## 2023-09-09 LAB — CBG MONITORING, ED: Glucose-Capillary: 82 mg/dL (ref 70–99)

## 2023-09-09 LAB — BASIC METABOLIC PANEL
Anion gap: 10 (ref 5–15)
BUN: 15 mg/dL (ref 8–23)
CO2: 27 mmol/L (ref 22–32)
Calcium: 8.9 mg/dL (ref 8.9–10.3)
Chloride: 101 mmol/L (ref 98–111)
Creatinine, Ser: 0.73 mg/dL (ref 0.44–1.00)
GFR, Estimated: >60 mL/min (ref >60)
Glucose, Bld: 86 mg/dL (ref 70–99)
Potassium: 3.8 mmol/L (ref 3.5–5.1)
Sodium: 138 mmol/L (ref 135–145)

## 2023-09-09 LAB — TROPONIN I (HIGH SENSITIVITY): Troponin I (High Sensitivity): 3 ng/L (ref <18)

## 2023-09-09 MED ORDER — LORAZEPAM 2 MG/ML IJ SOLN: 1.0000 mg | Freq: Once | INTRAMUSCULAR | Status: DC | PRN

## 2023-09-09 NOTE — ED Notes (Signed)
This RN has called over to MRI to determine how many patients are ahead of her; was told there are 2 patients ahead of her and that it could be about an hour and a half. Pt updated.

## 2023-09-09 NOTE — ED Notes (Signed)
Pt called out again stating she does not want to wait for the MRI, states she wants to leave AMA. Dr. Scotty Court once again notified via face to face conversation.

## 2023-09-09 NOTE — ED Notes (Signed)
Dr. Scotty Court at bedside to speak with the patient.

## 2023-09-09 NOTE — ED Provider Notes (Signed)
Procedures     ----------------------------------------- 6:15 PM on 09/09/2023 ----------------------------------------- Patient wishes to be discharged immediately.  States that she is unhappy with the prolonged wait for MRI which has yet to be performed.  Complains of persistent headache which feels like a typical migraine to her.  Wants to go home and take her usual medicine.  Declines to have medication here right now, wants to be discharged immediately.  She has medical decision-making capacity and will be discharged AGAINST MEDICAL ADVICE.     Sharman Cheek, MD 09/09/23 [phone]

## 2023-09-09 NOTE — ED Triage Notes (Signed)
Pt here with dizziness, headache, and blurred vision since 1000. Pt states the double vision has resolved. Pt states she called ems who came and checked her and said she was fine. Pt denies weakness in all extremities. Grip strength equal on both hands.

## 2023-09-09 NOTE — ED Notes (Addendum)
Pts husband came out to the nurses station asking when MRI will see his wife. This nurse told the pt and husband that it would at least be a hour. Pt stated " I am just gonna leave because I haven't eaten and I'm going to pass out" This nurse advised the pt that she is being kept NPO for her procedure. Pt stated that she could "get her MRI done else where" This nurse advised the pt that she would inform the doctor and then we could make a informed decision from there. This nurse notified the primary nurse for this pt Shawna Orleans, Charity fundraiser). MD is aware.

## 2023-09-09 NOTE — ED Notes (Signed)
Pt reports approximately at 10am she started having severe double vision, hot sensation and HA and dizziness. Pt reports vision has resolved but still endorses HA. No unilateral weakness/numbness or speech changes.

## 2023-09-09 NOTE — ED Provider Notes (Signed)
[facility] Provider Note    Event Date/Time   First MD Initiated Contact with Patient 09/09/23 1243     (approximate)   History   Dizziness   HPI  Savannah [name] is a 77 y.o. female with a history of hypertension, hyperlipidemia, osteoporosis, hypothyroidism, migraines, and anxiety who presents with an episode of dizziness approximately an hour ago, acute onset, lasting a few seconds, now resolved.  The patient states that she was sitting on her sofa doing work on her computer when she suddenly felt very lightheaded, had double vision, and felt like she was about to pass out.  This lasted seconds and then resolved.  Subsequently she started develop a headache mainly over the back of her head.  The dizziness and vision change have completely resolved but the headache remains.  She states that she had 1 episode like this previously 5 or 6 weeks ago.  She denies any associated chest pain, palpitations, difficulty breathing, nausea, vomiting, fever, or any other visual disturbances.  The patient states that when she gets a migraine she typically does get an aura and describes a scotoma, but states that this was different.  I reviewed the past medical records.  The patient has no recent ED visits or admissions.  Her most recent outpatient encounter was with primary care on 6/27 for follow-up of her chronic conditions.   Physical Exam   Triage Vital Signs: ED Triage Vitals  Encounter Vitals Group     BP 09/09/23 1233 (!) 183/74     Systolic BP Percentile --      Diastolic BP Percentile --      Pulse Rate 09/09/23 1233 68     Resp 09/09/23 1233 20     Temp 09/09/23 1233 98.5 F (36.9 C)     Temp src --      SpO2 09/09/23 1233 98 %     Weight --      Height --      Head Circumference --      Peak Flow --      Pain Score 09/09/23 1234 5     Pain Loc --      Pain Education --      Exclude from Growth Chart --     Most recent vital signs: Vitals:   09/09/23  1500 09/09/23 1530  BP: 116/68 126/65  Pulse: 67 66  Resp: 19 18  Temp:    SpO2: 97% 97%     General: Awake, no distress.  CV:  Good peripheral perfusion.  Resp:  Normal effort.  Abd:  No distention.  Other:  EOMI.  PERRLA.  No facial droop.  Normal speech.  5/5 motor strength and intact sensation of bilateral upper and lower extremities.  No ataxia on finger-to-nose.  No pronator drift.   ED Results / Procedures / Treatments   Labs (all labs ordered are listed, but only abnormal results are displayed) Labs Reviewed  CBC WITH DIFFERENTIAL/PLATELET - Abnormal; Notable for the following components:      Result Value   Hemoglobin 15.1 (*)    All other components within normal limits  BASIC METABOLIC PANEL  CBG MONITORING, ED  TROPONIN I (HIGH SENSITIVITY)     EKG  ED ECG REPORT I, Dionne Bucy, the attending physician, personally viewed and interpreted this ECG.  Date: 09/09/2023 EKG Time: 1241 Rate: 66 Rhythm: normal sinus rhythm QRS Axis: normal Intervals: normal ST/T Wave abnormalities: normal Narrative Interpretation: no evidence  of acute ischemia    RADIOLOGY  CT head: I independently viewed and interpreted the images; there is no ICH.  Radiology report indicates no acute abnormality.  MR brain: Pending   PROCEDURES:  Critical Care performed: No  Procedures   MEDICATIONS ORDERED IN ED: Medications  LORazepam (ATIVAN) injection 1 mg (has no administration in time range)     IMPRESSION / MDM / ASSESSMENT AND PLAN / ED COURSE  I reviewed the triage vital signs and the nursing notes.  77 year old female with PMH as noted above presents after an acute very brief episode of dizziness and double vision which resolved after a few seconds but now has been followed by headache and elevated blood pressure.  She had 1 prior episode 5 or 6 weeks ago like this.  Currently, the patient is well-appearing.  She is hypertensive with otherwise normal vital  signs.  Thorough neurologic exam is normal.  Differential diagnosis includes, but is not limited to, vasovagal episode, other near syncope, migraine with aura, amaurosis fugax, TIA, temporal lobe epilepsy, cardiac dysrhythmia, hypoglycemia.  We will obtain lab workup, CT head, and reassess.  Patient's presentation is most consistent with acute presentation with potential threat to life or bodily function.  The patient is on the cardiac monitor to evaluate for evidence of arrhythmia and/or significant heart rate changes.  ----------------------------------------- 4:25 PM on 09/09/2023 -----------------------------------------  Labs are unremarkable.  There is no leukocytosis or anemia.  Electrolytes are normal.  Troponin is negative.  There is no indication for a repeat given the lack of specific cardiac symptoms or EKG findings.  CT head is negative.  I have ordered an MRI.  I have signed the patient out to the oncoming ED physician Dr. Scotty Court.  Anticipate she will be appropriate for discharge home if her workup is negative.   FINAL CLINICAL IMPRESSION(S) / ED DIAGNOSES   Final diagnoses:  Dizziness     Rx / DC Orders   ED Discharge Orders     None        Note:  This document was prepared using Dragon voice recognition software and may include unintentional dictation errors.    Dionne Bucy, MD 09/09/23 1626

## 2023-09-09 NOTE — ED Notes (Signed)
Pt refused to sign AMA paperwork. Risk of not getting the MRI as well as risks of leaving AMA were explained to the patient who verbalized understanding. She reported she felt as though she did not receive adequate care and was ignored. She expressed that she was upset that she did not receive any medication for her headache, she was not given any food and she was upset about the delay of the MRI. This RN explained to the patient the reasoning behind no food or drink was given to her, which was that a medical emergency or procedure would need to be ruled out first.  This RN had also explained to the patient multiple times about the delay in the MRI as well as the reason for the delay, which is that there were other patients ahead of her. The patient continued to explain that she was still unhappy and stated she had never been treated this way before and that she would be filing a complaint. I apologized to her for the delay in the MRI and told her that my hands were tied, that I told the doctor as well as called over to MRI to determine the delay. I told her that unfortunately the delay in the MRI was not my fault. She stated she understood but she still wanted to leave. The patient's IV was removed as well and the patient refused to sign any AMA paperwork. Dr. Scotty Court told me that she would need to be signed out as AMA. Pt seen by this RN ambulating out of the ED with independent steady gait and was accompanied by her husband.

## 2023-09-09 NOTE — ED Notes (Signed)
EDP at bedside  

## 2023-09-10 ENCOUNTER — Encounter: Payer: Self-pay | Admitting: Nurse Practitioner

## 2023-09-12 DIAGNOSIS — M5481 Occipital neuralgia: Secondary | ICD-10-CM | POA: Diagnosis not present

## 2023-09-12 DIAGNOSIS — R519 Headache, unspecified: Secondary | ICD-10-CM | POA: Diagnosis not present

## 2023-09-12 DIAGNOSIS — E569 Vitamin deficiency, unspecified: Secondary | ICD-10-CM | POA: Diagnosis not present

## 2023-09-12 DIAGNOSIS — F411 Generalized anxiety disorder: Secondary | ICD-10-CM | POA: Diagnosis not present

## 2023-09-12 DIAGNOSIS — H532 Diplopia: Secondary | ICD-10-CM | POA: Diagnosis not present

## 2023-09-12 DIAGNOSIS — E559 Vitamin D deficiency, unspecified: Secondary | ICD-10-CM | POA: Diagnosis not present

## 2023-09-12 NOTE — Telephone Encounter (Signed)
Requested medication (s) are due for refill today:   Provider to review  Requested medication (s) are on the active medication list:   Yes  Future visit scheduled:   Yes 06/18/2024 with Jolene   Last ordered: 10/08/2022 1 patch, 0 refills  No protocol assigned to this medication   Requested Prescriptions  Pending Prescriptions Disp Refills   scopolamine (TRANSDERM-SCOP) 1 MG/3DAYS [Pharmacy Med Name: SCOPOLAMINE 1 MG/3 DAY PATCH] 1 patch 0    Sig: Place 1 patch (1.5 mg total) onto the skin every 3 (three) days.     Off-Protocol Failed - 09/08/2023 11:31 AM      Failed - Medication not assigned to a protocol, review manually.      Passed - Valid encounter within last 12 months    Recent Outpatient Visits           2 months ago Medicare annual wellness visit, subsequent   [place] [facility] [place], [place] T, NP   5 months ago COVID-19 virus RNA test result positive at limit of detection   [place] [facility] [place], Dorie Rank, NP   8 months ago Acute cough   [place] [facility] [place], [place] T, NP   11 months ago Herpes simplex vulvovaginitis   [place] [facility] [place], Corrie Dandy T, NP   1 year ago Acute cough   [place] [facility] [place], Dorie Rank, NP       Future Appointments             In 9 months [place], Dorie Rank, NP  [facility]

## 2023-09-15 ENCOUNTER — Other Ambulatory Visit: Payer: Self-pay | Admitting: Neurology

## 2023-09-15 ENCOUNTER — Encounter: Payer: Self-pay | Admitting: Nurse Practitioner

## 2023-09-15 DIAGNOSIS — H532 Diplopia: Secondary | ICD-10-CM

## 2023-09-16 MED ORDER — ESCITALOPRAM OXALATE 10 MG PO TABS: 10.0000 mg | ORAL_TABLET | Freq: Every day | ORAL | 2 refills | Status: DC

## 2023-09-29 ENCOUNTER — Ambulatory Visit
Admission: RE | Admit: 2023-09-29 | Discharge: 2023-09-29 | Disposition: A | Discharge status: Home / Self Care | Payer: Medicare PPO | Source: Ambulatory Visit | Attending: Neurology | Admitting: Neurology

## 2023-09-29 DIAGNOSIS — H532 Diplopia: Secondary | ICD-10-CM | POA: Diagnosis not present

## 2023-09-29 DIAGNOSIS — R419 Unspecified symptoms and signs involving cognitive functions and awareness: Secondary | ICD-10-CM | POA: Diagnosis not present

## 2023-09-29 MED ORDER — GADOBUTROL 1 MMOL/ML IV SOLN
7.5000 mL | Freq: Once | INTRAVENOUS | Status: AC | PRN
  Administered 2023-09-29: 7.5 mL via INTRAVENOUS

## 2023-10-03 DIAGNOSIS — M79671 Pain in right foot: Secondary | ICD-10-CM | POA: Diagnosis not present

## 2023-10-03 DIAGNOSIS — M7661 Achilles tendinitis, right leg: Secondary | ICD-10-CM | POA: Diagnosis not present

## 2023-10-29 DIAGNOSIS — R569 Unspecified convulsions: Secondary | ICD-10-CM | POA: Diagnosis not present

## 2023-11-01 DIAGNOSIS — R55 Syncope and collapse: Secondary | ICD-10-CM | POA: Diagnosis not present

## 2023-11-01 DIAGNOSIS — I493 Ventricular premature depolarization: Secondary | ICD-10-CM | POA: Diagnosis not present

## 2023-11-01 DIAGNOSIS — E782 Mixed hyperlipidemia: Secondary | ICD-10-CM | POA: Diagnosis not present

## 2023-11-01 DIAGNOSIS — I10 Essential (primary) hypertension: Secondary | ICD-10-CM | POA: Diagnosis not present

## 2023-11-01 DIAGNOSIS — H532 Diplopia: Secondary | ICD-10-CM | POA: Diagnosis not present

## 2023-11-08 DIAGNOSIS — R55 Syncope and collapse: Secondary | ICD-10-CM | POA: Diagnosis not present

## 2023-11-09 DIAGNOSIS — R55 Syncope and collapse: Secondary | ICD-10-CM | POA: Diagnosis not present

## 2023-11-29 DIAGNOSIS — R55 Syncope and collapse: Secondary | ICD-10-CM | POA: Diagnosis not present

## 2023-12-06 ENCOUNTER — Encounter: Payer: Self-pay | Admitting: Nurse Practitioner

## 2023-12-06 ENCOUNTER — Other Ambulatory Visit: Payer: Self-pay | Admitting: Nurse Practitioner

## 2023-12-06 DIAGNOSIS — E782 Mixed hyperlipidemia: Secondary | ICD-10-CM | POA: Diagnosis not present

## 2023-12-06 DIAGNOSIS — I493 Ventricular premature depolarization: Secondary | ICD-10-CM | POA: Diagnosis not present

## 2023-12-06 DIAGNOSIS — I10 Essential (primary) hypertension: Secondary | ICD-10-CM | POA: Diagnosis not present

## 2023-12-06 MED ORDER — CLOBETASOL PROPIONATE 0.05 % EX OINT: 1.0000 | TOPICAL_OINTMENT | Freq: Two times a day (BID) | CUTANEOUS | 4 refills | Status: DC

## 2023-12-07 DIAGNOSIS — H532 Diplopia: Secondary | ICD-10-CM | POA: Diagnosis not present

## 2023-12-07 DIAGNOSIS — H35033 Hypertensive retinopathy, bilateral: Secondary | ICD-10-CM | POA: Diagnosis not present

## 2023-12-07 MED ORDER — CLOBETASOL PROPIONATE 0.05 % EX OINT: 1.0000 | TOPICAL_OINTMENT | Freq: Two times a day (BID) | CUTANEOUS | 4 refills | Status: AC

## 2023-12-28 DIAGNOSIS — G43109 Migraine with aura, not intractable, without status migrainosus: Secondary | ICD-10-CM | POA: Diagnosis not present

## 2023-12-28 DIAGNOSIS — F411 Generalized anxiety disorder: Secondary | ICD-10-CM | POA: Diagnosis not present

## 2024-02-10 DIAGNOSIS — D225 Melanocytic nevi of trunk: Secondary | ICD-10-CM | POA: Diagnosis not present

## 2024-02-10 DIAGNOSIS — L821 Other seborrheic keratosis: Secondary | ICD-10-CM | POA: Diagnosis not present

## 2024-02-10 DIAGNOSIS — D2261 Melanocytic nevi of right upper limb, including shoulder: Secondary | ICD-10-CM | POA: Diagnosis not present

## 2024-02-10 DIAGNOSIS — D2272 Melanocytic nevi of left lower limb, including hip: Secondary | ICD-10-CM | POA: Diagnosis not present

## 2024-02-10 DIAGNOSIS — D2262 Melanocytic nevi of left upper limb, including shoulder: Secondary | ICD-10-CM | POA: Diagnosis not present

## 2024-02-10 DIAGNOSIS — Z08 Encounter for follow-up examination after completed treatment for malignant neoplasm: Secondary | ICD-10-CM | POA: Diagnosis not present

## 2024-02-10 DIAGNOSIS — Z85828 Personal history of other malignant neoplasm of skin: Secondary | ICD-10-CM | POA: Diagnosis not present

## 2024-02-10 DIAGNOSIS — D0462 Carcinoma in situ of skin of left upper limb, including shoulder: Secondary | ICD-10-CM | POA: Diagnosis not present

## 2024-02-10 DIAGNOSIS — D2271 Melanocytic nevi of right lower limb, including hip: Secondary | ICD-10-CM | POA: Diagnosis not present

## 2024-02-10 DIAGNOSIS — L814 Other melanin hyperpigmentation: Secondary | ICD-10-CM | POA: Diagnosis not present

## 2024-05-07 ENCOUNTER — Telehealth: Payer: Self-pay | Admitting: Nurse Practitioner

## 2024-05-07 NOTE — Telephone Encounter (Signed)
 Copied from CRM [id number]. Topic: Medicare AWV >> May 07, 2024 10:44 AM Juliana Ocean wrote: Reason for CRM: LVM 05/07/2024 to schedule AWV. Please schedule office or virtual visits  Rosalee Collins; Care Guide Ambulatory Clinical Support [place] [facility] Direct Dial: [phone]

## 2024-06-16 NOTE — Patient Instructions (Signed)
 Be Involved in Caring For Your Health:  Taking Medications When medications are taken as directed, they can greatly improve your health. But if they are not taken as prescribed, they may not work. In some cases, not taking them correctly can be harmful. To help ensure your treatment remains effective and safe, understand your medications and how to take them. Bring your medications to each visit for review by your provider.  Your lab results, notes, and after visit summary will be available on My Chart. We strongly encourage you to use this feature. If lab results are abnormal the clinic will contact you with the appropriate steps. If the clinic does not contact you assume the results are satisfactory. You can always view your results on My Chart. If you have questions regarding your health or results, please contact the clinic during office hours. You can also ask questions on My Chart.  We at [facility] are grateful that you chose [place] to provide your care. We strive to provide evidence-based and compassionate care and are always looking for feedback. If you get a survey from the clinic please complete this so we can hear your opinions.  Managing Anxiety, Adult After being diagnosed with anxiety, you may be relieved to know why you have felt or behaved a certain way. You may also feel overwhelmed about the treatment ahead and what it will mean for your life. With care and support, you can manage your anxiety. How to manage lifestyle changes Understanding the difference between stress and anxiety Although stress can play a role in anxiety, it is not the same as anxiety. Stress is your body's reaction to life changes and events, both good and bad. Stress is often caused by something external, such as a deadline, test, or competition. It normally goes away after the event has ended and will last just a few hours. But, stress can be ongoing and can lead to more than just stress. Anxiety is  caused by something internal, such as imagining a terrible outcome or worrying that something will go wrong that will greatly upset you. Anxiety often does not go away even after the event is over, and it can become a long-term (chronic) worry. Lowering stress and anxiety Talk with your health care provider or a counselor to learn more about lowering anxiety and stress. They may suggest tension-reduction techniques, such as: Music. Spend time creating or listening to music that you enjoy and that inspires you. Mindfulness-based meditation. Practice being aware of your normal breaths while not trying to control your breathing. It can be done while sitting or walking. Centering prayer. Focus on a word, phrase, or sacred image that means something to you and brings you peace. Deep breathing. Expand your stomach and inhale slowly through your nose. Hold your breath for 3-5 seconds. Then breathe out slowly, letting your stomach muscles relax. Self-talk. Learn to notice and spot thought patterns that lead to anxiety reactions. Change those patterns to thoughts that feel peaceful. Muscle relaxation. Take time to tense muscles and then relax them. Choose a tension-reduction technique that fits your lifestyle and personality. These techniques take time and practice. Set aside 5-15 minutes a day to do them. Specialized therapists can offer counseling and training in these techniques. The training to help with anxiety may be covered by some insurance plans. Other things you can do to manage stress and anxiety include: Keeping a stress diary. This can help you learn what triggers your reaction and then learn ways  to manage your response. Thinking about how you react to certain situations. You may not be able to control everything, but you can control your response. Making time for activities that help you relax and not feeling guilty about spending your time in this way. Doing visual imagery. This involves  imagining or creating mental pictures to help you relax. Practicing yoga. Through yoga poses, you can lower tension and relax.  Medicines Medicines for anxiety include: Antidepressant medicines. These are usually prescribed for long-term daily control. Anti-anxiety medicines. These may be added in severe cases, especially when panic attacks occur. When used together, medicines, psychotherapy, and tension-reduction techniques may be the most effective treatment. Relationships Relationships can play a big part in helping you recover. Spend more time connecting with trusted friends and family members. Think about going to couples counseling if you have a partner, taking family education classes, or going to family therapy. Therapy can help you and others better understand your anxiety. How to recognize changes in your anxiety Everyone responds differently to treatment for anxiety. Recovery from anxiety happens when symptoms lessen and stop interfering with your daily life at home or work. This may mean that you will start to: Have better concentration and focus. Worry will interfere less in your daily thinking. Sleep better. Be less irritable. Have more energy. Have improved memory. Try to recognize when your condition is getting worse. Contact your provider if your symptoms interfere with home or work and you feel like your condition is not improving. Follow these instructions at home: Activity Exercise. Adults should: Exercise for at least 150 minutes each week. The exercise should increase your heart rate and make you sweat (moderate-intensity exercise). Do strengthening exercises at least twice a week. Get the right amount and quality of sleep. Most adults need 7-9 hours of sleep each night. Lifestyle  Eat a healthy diet that includes plenty of vegetables, fruits, whole grains, low-fat dairy products, and lean protein. Do not eat a lot of foods that are high in fats, added sugars, or salt  (sodium). Make choices that simplify your life. Do not use any products that contain nicotine or tobacco. These products include cigarettes, chewing tobacco, and vaping devices, such as e-cigarettes. If you need help quitting, ask your provider. Avoid caffeine, alcohol, and certain over-the-counter cold medicines. These may make you feel worse. Ask your pharmacist which medicines to avoid. General instructions Take over-the-counter and prescription medicines only as told by your provider. Keep all follow-up visits. This is to make sure you are managing your anxiety well or if you need more support. Where to find support You can get help and support from: Self-help groups. Online and [employer]. A trusted spiritual leader. Couples counseling. Family education classes. Family therapy. Where to find more information You may find that joining a support group helps you deal with your anxiety. The following sources can help you find counselors or support groups near you: Mental Health America: mentalhealthamerica.net Anxiety and Depression Association of [place] (ADAA): adaa.org The [employer] on Mental Illness (NAMI): nami.org Contact a health care provider if: You have a hard time staying focused or finishing tasks. You spend many hours a day feeling worried about everyday life. You are very tired because you cannot stop worrying. You start to have headaches or often feel tense. You have chronic nausea or diarrhea. Get help right away if: Your heart feels like it is racing. You have shortness of breath. You have thoughts of hurting yourself or others. Get help  right away if you feel like you may hurt yourself or others, or have thoughts about taking your own life. Go to your nearest emergency room or: Call 911. Call the National Suicide Prevention Lifeline at 765-482-1593 or 988. This is open 24 hours a day. Text the Crisis Text Line at 504 124 9896. This information is not  intended to replace advice given to you by your health care provider. Make sure you discuss any questions you have with your health care provider. Document Revised: 09/14/2022 Document Reviewed: 03/29/2021 Elsevier Patient Education  2024 [employer].

## 2024-06-18 ENCOUNTER — Ambulatory Visit: Payer: Self-pay | Admitting: Nurse Practitioner

## 2024-06-18 ENCOUNTER — Encounter: Payer: Self-pay | Admitting: Nurse Practitioner

## 2024-06-18 VITALS — BP 132/74 | HR 61 | Temp 97.6°F | Ht 65.4 in | Wt 152.0 lb

## 2024-06-18 DIAGNOSIS — E7800 Pure hypercholesterolemia, unspecified: Secondary | ICD-10-CM

## 2024-06-18 DIAGNOSIS — Z79899 Other long term (current) drug therapy: Secondary | ICD-10-CM

## 2024-06-18 DIAGNOSIS — G43119 Migraine with aura, intractable, without status migrainosus: Secondary | ICD-10-CM

## 2024-06-18 DIAGNOSIS — E063 Autoimmune thyroiditis: Secondary | ICD-10-CM

## 2024-06-18 DIAGNOSIS — I700 Atherosclerosis of aorta: Secondary | ICD-10-CM | POA: Diagnosis not present

## 2024-06-18 DIAGNOSIS — I10 Essential (primary) hypertension: Secondary | ICD-10-CM | POA: Diagnosis not present

## 2024-06-18 DIAGNOSIS — M810 Age-related osteoporosis without current pathological fracture: Secondary | ICD-10-CM

## 2024-06-18 DIAGNOSIS — E538 Deficiency of other specified B group vitamins: Secondary | ICD-10-CM

## 2024-06-18 DIAGNOSIS — I493 Ventricular premature depolarization: Secondary | ICD-10-CM

## 2024-06-18 DIAGNOSIS — M791 Myalgia, unspecified site: Secondary | ICD-10-CM

## 2024-06-18 DIAGNOSIS — Z0000 Encounter for general adult medical examination without abnormal findings: Secondary | ICD-10-CM | POA: Diagnosis not present

## 2024-06-18 DIAGNOSIS — F419 Anxiety disorder, unspecified: Secondary | ICD-10-CM

## 2024-06-18 LAB — URINALYSIS, ROUTINE W REFLEX MICROSCOPIC
Bilirubin, UA: NEGATIVE
Glucose, UA: NEGATIVE
Ketones, UA: NEGATIVE
Leukocytes,UA: NEGATIVE
Nitrite, UA: NEGATIVE
Protein,UA: NEGATIVE
RBC, UA: NEGATIVE
Specific Gravity, UA: 1.01 (ref 1.005–1.030)
Urobilinogen, Ur: 0.2 mg/dL (ref 0.2–1.0)
pH, UA: 7 (ref 5.0–7.5)

## 2024-06-18 MED ORDER — ATORVASTATIN CALCIUM 20 MG PO TABS: 20.0000 mg | ORAL_TABLET | Freq: Every day | ORAL | 4 refills | Status: AC

## 2024-06-18 MED ORDER — BUSPIRONE HCL 5 MG PO TABS: 5.0000 mg | ORAL_TABLET | Freq: Every day | ORAL | 3 refills | Status: AC

## 2024-06-18 MED ORDER — METOPROLOL SUCCINATE ER 50 MG PO TB24: ORAL_TABLET | ORAL | 4 refills | Status: AC

## 2024-06-18 MED ORDER — IBANDRONATE SODIUM 150 MG PO TABS
150.0000 mg | ORAL_TABLET | ORAL | 4 refills | Status: AC
Start: 2024-06-18 — End: ?

## 2024-06-18 MED ORDER — ALPRAZOLAM 0.25 MG PO TABS: 0.2500 mg | ORAL_TABLET | Freq: Every evening | ORAL | 0 refills | Status: AC | PRN

## 2024-06-18 MED ORDER — LEVOTHYROXINE SODIUM 75 MCG PO TABS: 75.0000 ug | ORAL_TABLET | Freq: Every day | ORAL | 4 refills | Status: AC

## 2024-06-18 NOTE — Assessment & Plan Note (Signed)
 Chronic, ongoing, continue Boniva .  Check Vitamin D  level today.  Repeat DEXA in August 2025.

## 2024-06-18 NOTE — Progress Notes (Signed)
 Subjective:   Savannah [name] is a 78 y.o. female who presents for Medicare Annual (Subsequent) preventive examination.  Visit Complete: In person  Patient Medicare AWV questionnaire was completed by the patient on 06/18/24; I have confirmed that all information answered by patient is correct and no changes since this date.  Would like to continue pap smears every 2 years -- last in 2023.  She is aware of guidelines.  HYPERTENSION / HYPERLIPIDEMIA Taking Metoprolol  XL 25 MG daily and Atorvastatin . In past tried 3 different statins in past and all caused headaches. Satisfied with current treatment? yes Duration of hypertension: chronic BP monitoring frequency: rarely BP range: 110-140/60-70 BP medication side effects: no Duration of hyperlipidemia: chronic Cholesterol medication side effects: no Cholesterol supplements: none Medication compliance: good compliance Aspirin: no Recent stressors: no Recurrent headaches: yes, migraines Visual changes: no Palpitations: no Dyspnea: no Chest pain: no Lower extremity edema: occasional Dizzy/lightheaded: no  The 10-year ASCVD risk score (Arnett DK, et al., 2019) is: 33.8%   Values used to calculate the score:     Age: 37 years     Clincally relevant sex: Female     Is Non-Hispanic African American: No     Diabetic: No     Tobacco smoker: No     Systolic Blood Pressure: 132 mmHg     Is BP treated: Yes     HDL Cholesterol: 67 mg/dL     Total Cholesterol: 184 mg/dL  OSTEOPOROSIS DEXA 1/85/76, T-score -3.0, continues on Boniva . This is similar to previous DEXA. No recent falls or fractures. Satisfied with current treatment?: yes Medication side effects: no Medication compliance: good compliance Past osteoporosis medications/treatments: no Adequate calcium  & vitamin D : yes Intolerance to bisphosphonates:no Weight bearing exercises: yes   HYPOTHYROIDISM Taking Levothyroxine  75 MCG daily.   Thyroid  control status:stable Satisfied  with current treatment? yes Medication side effects: no Medication compliance: good compliance Etiology of hypothyroidism:  Recent dose adjustment:no Fatigue: no Cold intolerance: no Heat intolerance: no Weight gain: no Weight loss: no Constipation: no Diarrhea/loose stools: no Palpitations: no Lower extremity edema: occasional Anxiety/depressed mood: no    MIGRAINES Follows with Dr. Maree at neurology -- has followed him for years -- last visit was 10/16/21.  Received injections in past.  MRI brain last in January 2022 with no acute findings. Taking B12 supplement orally.   Duration: chronic Onset: sudden Severity: mild Quality: aching Frequency: occasional Radiation: no Headache status at time of visit: asymptomatic Treatments attempted: Metoprolol , Amlodipine , Ajovy, nerve block Aura: no Nausea:  no Vomiting: no Photophobia:  no Phonophobia:  no Effect on social functioning:  no Numbers of missed days of school/work each month: 0 Confusion:  no Gait disturbance/ataxia:  no Behavioral changes:  no Fevers:  no    ANXIETY/STRESS Takes Xanax  0.25 MG as needed daily for anxiety -- last fill 07/09/23.  Lexapro  and Celexa  caused diarrhea in past.  Pt is aware of risks of benzo medication use to include increased sedation, respiratory suppression, falls, dependence and cardiovascular events.  Pt would like to continue treatment as benefit determined to outweigh risk -- she has been on Xanax  since her 30's. Mood status: stable Satisfied with current treatment?: yes Symptom severity: mild  Duration of current treatment : chronic Side effects: no Medication compliance: excellent compliance Psychotherapy/counseling: no Depressed mood: no Anxious mood: no Anhedonia: no Significant weight loss or gain: no Insomnia: no  Fatigue: no Feelings of worthlessness or guilt: no Impaired concentration/indecisiveness: no Suicidal ideations: no  Hopelessness: no Crying spells: no     06/18/2024   10:14 AM 06/16/2023   10:19 AM 04/04/2023    3:22 PM 12/29/2022    2:16 PM 10/08/2022    8:27 AM  Depression screen PHQ 2/9  Decreased Interest 0 0 0 0 0  Down, Depressed, Hopeless 0 0 0 0 0  PHQ - 2 Score 0 0 0 0 0  Altered sleeping 0 0 0 0 0  Tired, decreased energy 0 0 0 0 0  Change in appetite 0 0 0 0 0  Feeling bad or failure about yourself  0 0 0 0 0  Trouble concentrating 0 0 0 0 0  Moving slowly or fidgety/restless 0 0 0 0 0  Suicidal thoughts 0 0 0 0 0  PHQ-9 Score 0 0 0 0 0  Difficult doing work/chores Not difficult at all   Not difficult at all Not difficult at all       06/18/2024   10:14 AM 06/16/2023   10:20 AM 04/04/2023    3:22 PM 12/29/2022    2:16 PM  GAD 7 : Generalized Anxiety Score  Nervous, Anxious, on Edge 1 0 0 0  Control/stop worrying 0 0 0 0  Worry too much - different things 1 0 0 0  Trouble relaxing 0 0 0 0  Restless 0 0 0 0  Easily annoyed or irritable 0 0 0 0  Afraid - awful might happen 0 0 0 0  Total GAD 7 Score 2 0 0 0  Anxiety Difficulty Not difficult at all Not difficult at all Not difficult at all Not difficult at all   Cardiac Risk Factors include: advanced age (>39men, >77 women);hypertension     Objective:    Today's Vitals   06/18/24 1011 06/18/24 1047  BP: (!) 147/73 132/74  Pulse: 61   Temp: 97.6 F (36.4 C)   TempSrc: Oral   SpO2: 98%   Weight: 152 lb (68.9 kg)   Height: 5' 5.4 (1.661 m)   PainSc: 0-No pain    Body mass index is 24.99 kg/m.  Physical Exam Vitals and nursing note reviewed.  Constitutional:      General: She is not in acute distress.    Appearance: Normal appearance. She is well-groomed and normal weight. She is not ill-appearing or toxic-appearing.  HENT:     Head: Normocephalic.     Right Ear: Hearing, tympanic membrane, ear canal and external ear normal.     Left Ear: Hearing, tympanic membrane, ear canal and external ear normal.     Nose: Nose normal.     Right Sinus: No maxillary  sinus tenderness or frontal sinus tenderness.     Left Sinus: No maxillary sinus tenderness or frontal sinus tenderness.     Mouth/Throat:     Mouth: Mucous membranes are moist.     Pharynx: No pharyngeal swelling, oropharyngeal exudate or posterior oropharyngeal erythema.   Eyes:     General: Lids are normal.     Extraocular Movements: Extraocular movements intact.     Conjunctiva/sclera: Conjunctivae normal.     Pupils: Pupils are equal, round, and reactive to light.     Visual Fields: Right eye visual fields normal and left eye visual fields normal.   Neck:     Thyroid : No thyromegaly.     Vascular: No carotid bruit.   Cardiovascular:     Rate and Rhythm: Normal rate and regular rhythm.     Pulses:  Popliteal pulses are 2+ on the right side and 2+ on the left side.       Dorsalis pedis pulses are 2+ on the right side and 2+ on the left side.     Heart sounds: Normal heart sounds. No murmur heard.    No gallop.  Pulmonary:     Effort: Pulmonary effort is normal.     Breath sounds: Normal breath sounds. No decreased breath sounds, wheezing or rales.  Abdominal:     General: Abdomen is flat. Bowel sounds are normal. There is no distension.     Palpations: Abdomen is soft.     Tenderness: There is no abdominal tenderness.   Musculoskeletal:     Cervical back: Full passive range of motion without pain and normal range of motion.     Right lower leg: Edema (trace) present.     Left lower leg: Edema (trace) present.  Lymphadenopathy:     Head:     Right side of head: No submental, submandibular, tonsillar, preauricular or posterior auricular adenopathy.     Left side of head: No submental, submandibular, tonsillar, preauricular or posterior auricular adenopathy.     Cervical: No cervical adenopathy.   Skin:    General: Skin is warm.     Capillary Refill: Capillary refill takes less than 2 seconds.   Neurological:     General: No focal deficit present.     Mental  Status: She is alert.     Cranial Nerves: Cranial nerves 2-12 are intact.     Motor: Motor function is intact.     Coordination: Coordination is intact.     Gait: Gait is intact.     Deep Tendon Reflexes: Reflexes are normal and symmetric.     Reflex Scores:      Brachioradialis reflexes are 2+ on the right side and 2+ on the left side.      Patellar reflexes are 2+ on the right side and 2+ on the left side.  Psychiatric:        Attention and Perception: Attention normal.        Mood and Affect: Mood normal.        Speech: Speech normal.        Behavior: Behavior is cooperative.        Thought Content: Thought content normal.        Cognition and Memory: Cognition normal.        Judgment: Judgment normal.         06/18/2024   10:08 AM 09/14/2021    2:20 PM 05/15/2021    9:45 AM 08/27/2020   11:03 AM 05/12/2020   10:08 AM 12/11/2019    1:05 PM 05/01/2018    1:58 PM  Advanced Directives  Does Patient Have a Medical Advance Directive? Yes No Yes No Yes No Yes   Type of Estate agent of [place];Living will  Healthcare Power of [place];Living will  Living will;Healthcare Power of [employer] Power of [place];Living will  Does patient want to make changes to medical advance directive? No - Patient declined        Copy of Healthcare Power of Attorney in Chart? Yes - validated most recent copy scanned in chart (See row information)  No - copy requested  No - copy requested  No - copy requested   Would patient like information on creating a medical advance directive?    No - Patient declined  No - Patient declined  Data saved with a previous flowsheet row definition    Current Medications (verified) Outpatient Encounter Medications as of 06/18/2024  Medication Sig   busPIRone (BUSPAR) 5 MG tablet Take 1 tablet (5 mg total) by mouth daily.   cholecalciferol (VITAMIN D3) 25 MCG (1000 UNIT) tablet Take 1,000 Units by mouth daily.   clobetasol  ointment  (TEMOVATE ) 0.05 % Apply 1 Application topically 2 (two) times daily.   fluocinonide (LIDEX) 0.05 % external solution Apply topically.   scopolamine  (TRANSDERM-SCOP) 1 MG/3DAYS Place 1 patch (1.5 mg total) onto the skin every 3 (three) days.   vitamin B-12 (CYANOCOBALAMIN ) 1000 MCG tablet Take 1,000 mcg by mouth daily.   [DISCONTINUED] ALPRAZolam  (XANAX ) 0.25 MG tablet Take 1 tablet (0.25 mg total) by mouth at bedtime as needed for anxiety.   [DISCONTINUED] atorvastatin  (LIPITOR) 20 MG tablet Take 1 tablet (20 mg total) by mouth daily.   [DISCONTINUED] ibandronate  (BONIVA ) 150 MG tablet Take 1 tablet (150 mg total) by mouth every 30 (thirty) days. Take in the morning with a full glass of water, on an empty stomach, and do not take anything else by mouth or lie down for the next 30 min.   [DISCONTINUED] levothyroxine  (SYNTHROID ) 75 MCG tablet Take 1 tablet (75 mcg total) by mouth daily before breakfast.   [DISCONTINUED] metoprolol  succinate (TOPROL -XL) 50 MG 24 hr tablet Take with or immediately following a meal.Take 1/2 tablet (25 mg) daily with or immediately following a meal.   ALPRAZolam  (XANAX ) 0.25 MG tablet Take 1 tablet (0.25 mg total) by mouth at bedtime as needed for anxiety.   atorvastatin  (LIPITOR) 20 MG tablet Take 1 tablet (20 mg total) by mouth daily.   ibandronate  (BONIVA ) 150 MG tablet Take 1 tablet (150 mg total) by mouth every 30 (thirty) days. Take in the morning with a full glass of water, on an empty stomach, and do not take anything else by mouth or lie down for the next 30 min.   levothyroxine  (SYNTHROID ) 75 MCG tablet Take 1 tablet (75 mcg total) by mouth daily before breakfast.   metoprolol  succinate (TOPROL -XL) 50 MG 24 hr tablet Take with or immediately following a meal.Take 1/2 tablet (25 mg) daily with or immediately following a meal.   [DISCONTINUED] escitalopram  (LEXAPRO ) 10 MG tablet Take 1 tablet (10 mg total) by mouth daily. (Patient not taking: Reported on 06/18/2024)    No facility-administered encounter medications on file as of 06/18/2024.    Allergies (verified) Amoxil [amoxicillin] and Penicillins   History: Past Medical History:  Diagnosis Date   Anxiety    Cancer (HCC)    skin- squamous   HOH (hard of hearing)    WEARS AIDS   Hyperlipidemia    Hypertension    CONTROLLED ON MEDS   Hypothyroidism    IBS (irritable bowel syndrome)    Migraine    MVP (mitral valve prolapse)    OA (osteoarthritis) of neck    JOINTS   Osteoporosis    Thyroid  disease    Past Surgical History:  Procedure Laterality Date   ANKLE FRACTURE SURGERY Left    BREAST CYST ASPIRATION Right 90s   COLONOSCOPY WITH PROPOFOL  N/A 04/12/2016   Procedure: COLONOSCOPY WITH PROPOFOL ;  Surgeon: Lamar ONEIDA Holmes, MD;  [facility] END[facility];  Service: Endoscopy;  Laterality: N/A;   FRACTURE SURGERY     ANKLE   HAMMER TOE SURGERY Right 02/08/2018   Procedure: HAMMER TOE CORRECTION-5TH TOE, Excision soft tissue mass dorsal lateral right fifth toe and exostectomy  dorsal lateral DIPJ fifth toe;  Surgeon: Ashley Soulier, DPM;  Location: [facility];  Service: Podiatry;  Laterality: Right;  IVA LOCAL   moses surgery  09/2015   SINUS SURGERY WITH INSTATRAK  10/2017   DEVIATED SEPTUM   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   Family History  Problem Relation Age of Onset   Hypertension Mother    Colon cancer Mother    Lung cancer Father    Brain cancer Father    Hyperlipidemia Brother    Breast cancer Neg Hx    Social History   Socioeconomic History   Marital status: Married    Spouse name: Not on file   Number of children: Not on file   Years of education: Not on file   Highest education level: Master's degree (e.g., MA, MS, MEng, MEd, MSW, MBA)  Occupational History   Occupation: retired  Tobacco Use   Smoking status: Former    Current packs/day: 0.00    Average packs/day: 1 pack/day for 4.0 years (4.0 ttl pk-yrs)    Types: Cigarettes     Start date: 1966    Quit date: 1970    Years since quitting: 55.5   Smokeless tobacco: Never  Vaping Use   Vaping status: Never Used  Substance and Sexual Activity   Alcohol use: Yes    Alcohol/week: 10.0 standard drinks of alcohol    Types: 10 Glasses of wine per week   Drug use: No   Sexual activity: Yes    Birth control/protection: None    Comment: Married  Other Topics Concern   Not on file  Social History Narrative   Not on file   Social Drivers of Health   Financial Resource Strain: Low Risk  (06/18/2024)   Overall Financial Resource Strain (CARDIA)    Difficulty of Paying Living Expenses: Not hard at all  Food Insecurity: No Food Insecurity (06/18/2024)   Hunger Vital Sign    Worried About Running Out of Food in the Last Year: Never true    Ran Out of Food in the Last Year: Never true  Transportation Needs: No Transportation Needs (06/18/2024)   PRAPARE - Administrator, Civil Service (Medical): No    Lack of Transportation (Non-Medical): No  Physical Activity: Insufficiently Active (06/18/2024)   Exercise Vital Sign    Days of Exercise per Week: 3 days    Minutes of Exercise per Session: 20 min  Stress: Stress Concern Present (06/18/2024)   [employer] of Occupational Health - Occupational Stress Questionnaire    Feeling of Stress: To some extent  Social Connections: Moderately Isolated (06/18/2024)   Social Connection and Isolation Panel    Frequency of Communication with Friends and Family: More than three times a week    Frequency of Social Gatherings with Friends and Family: More than three times a week    Attends Religious Services: Never    Database administrator or Organizations: No    Attends Engineer, structural: Never    Marital Status: Married    Tobacco Counseling Counseling given: Not Answered   Clinical Intake:  Pre-visit preparation completed: Yes  Pain : No/denies pain Pain Score: 0-No pain     BMI - recorded:  24.99 Nutritional Status: BMI of 19-24  Normal Nutritional Risks: None Diabetes: No  How often do you need to have someone help you when you read instructions, pamphlets, or other written materials from your doctor or pharmacy?:  1 - Never What is the last grade level you completed in school?: Masters Degree  Interpreter Needed?: No  Information entered by :: Laymon Metro, CMA   Activities of Daily Living    06/18/2024   10:04 AM  In your present state of health, do you have any difficulty performing the following activities:  Hearing? 0  Vision? 0  Difficulty concentrating or making decisions? 0  Walking or climbing stairs? 0  Dressing or bathing? 0  Doing errands, shopping? 0  Preparing Food and eating ? N  Using the Toilet? N  In the past six months, have you accidently leaked urine? N  Do you have problems with loss of bowel control? N  Managing your Medications? N  Managing your Finances? N  Housekeeping or managing your Housekeeping? N    Patient Care Team: Firas Guardado T, NP as PCP - General (Nurse Practitioner) Herminio Miu, MD (Unknown Physician Specialty) Isenstein, Arin L, MD (Dermatology) Maree Jannett POUR, MD (Neurology) Leonce Garnette BIRCH, MD as Attending Physician (Obstetrics and Gynecology) Aundria Ladell POUR, MD as Consulting Physician (Gastroenterology) Elma Zachary RAMAN (Optometry) Hester Wolm PARAS, MD as Consulting Physician (Cardiology) Francisca Redell BROCKS, MD as Consulting Physician (Urology) Cleotilde Barrio, MD (Orthopedic Surgery)  Indicate any recent Medical Services you may have received from other than [facility] providers in the past year (date may be approximate).     Assessment:   This is a routine wellness examination for [facility].  Hearing/Vision screen No results found.   Goals Addressed   None    Depression Screen    06/18/2024   10:14 AM 06/16/2023   10:19 AM 04/04/2023    3:22 PM 12/29/2022    2:16 PM 10/08/2022    8:27 AM  09/06/2022   11:12 AM 08/16/2022    1:35 PM  PHQ 2/9 Scores  PHQ - 2 Score 0 0 0 0 0 0 0  PHQ- 9 Score 0 0 0 0 0 4 0    Fall Risk    06/18/2024   10:14 AM 06/16/2023   10:18 AM 04/04/2023    3:21 PM 12/29/2022    2:14 PM 10/08/2022    8:27 AM  Fall Risk   Falls in the past year? 0 0 0 0 0  Number falls in past yr: 0 0 0 0 0  Injury with Fall? 0 0 0 0 0  Risk for fall due to : No Fall Risks No Fall Risks No Fall Risks No Fall Risks No Fall Risks  Follow up Falls evaluation completed Falls evaluation completed Falls evaluation completed Falls evaluation completed  Falls evaluation completed      Data saved with a previous flowsheet row definition    MEDICARE RISK AT HOME: Medicare Risk at Home Any stairs in or around the home?: Yes If so, are there any without handrails?: No Home free of loose throw rugs in walkways, pet beds, electrical cords, etc?: No Adequate lighting in your home to reduce risk of falls?: No Life alert?: No Use of a cane, walker or w/c?: No Grab bars in the bathroom?: Yes Shower chair or bench in shower?: No Elevated toilet seat or a handicapped toilet?: No  TIMED UP AND GO:  Was the test performed?  Yes  Length of time to ambulate 10 feet: 3 sec Gait steady and fast without use of assistive device    Cognitive Function:        06/18/2024   10:09 AM 06/16/2023   11:16  AM 05/15/2021    9:48 AM 05/12/2020   10:07 AM 05/10/2019    2:47 PM  6CIT Screen  What Year? 0 points 0 points 0 points 0 points 0 points  What month? 0 points 0 points 0 points 0 points 0 points  What time? 0 points 0 points 0 points 0 points 0 points  Count back from 20 0 points 0 points 0 points 0 points 0 points  Months in reverse 0 points 0 points 0 points 0 points 0 points  Repeat phrase 0 points 0 points 0 points 0 points 0 points  Total Score 0 points 0 points 0 points 0 points 0 points    Immunizations Immunization History  Administered Date(s) Administered   Influenza,  High Dose Seasonal PF 09/07/2018, 08/17/2019   Influenza-Unspecified 10/20/2015, 11/03/2017, 08/17/2019, 09/10/2020   Moderna Sars-Covid-2 Vaccination 01/10/2020, 02/06/2020, 08/11/2020   Pneumococcal Conjugate-13 09/16/2015   Pneumococcal Polysaccharide-23 07/03/2013   Td 01/31/2007   Td (Adult), 2 Lf Tetanus Toxid, Preservative Free 01/31/2007   Tdap 05/01/2018   Zoster, Live 02/18/2009    TDAP status: Up to date  Flu Vaccine status: Up to date  Pneumococcal vaccine status: Up to date  Covid-19 vaccine status: Completed vaccines  Qualifies for Shingles Vaccine? Yes   Zostavax completed Yes   Shingrix Completed?: No.    Education has been provided regarding the importance of this vaccine. Patient has been advised to call insurance company to determine out of pocket expense if they have not yet received this vaccine. Advised may also receive vaccine at local pharmacy or Health Dept. Verbalized acceptance and understanding.  Screening Tests Health Maintenance  Topic Date Due   COVID-19 Vaccine (4 - 2024-25 season) 07/02/2024 (Originally 08/21/2023)   Zoster Vaccines- Shingrix (1 of 2) 09/16/2024 (Originally 04/06/1965)   INFLUENZA VACCINE  07/20/2024   DEXA SCAN  08/02/2024   MAMMOGRAM  08/07/2024   Colonoscopy  11/22/2024   Medicare Annual Wellness (AWV)  06/18/2025   DTaP/Tdap/Td (3 - Td or Tdap) 05/01/2028   Pneumococcal Vaccine: 50+ Years  Completed   Hepatitis C Screening  Completed   Hepatitis B Vaccines  Aged Out   HPV VACCINES  Aged Out   Meningococcal B Vaccine  Aged Out    Health Maintenance  There are no preventive care reminders to display for this patient.   Colorectal cancer screening: Type of screening: Colonoscopy. Completed 11/23/19. Repeat every 5 years  Mammogram status: Completed 08/08/23. Repeat every year  Bone Density status: Completed 08/02/22. Results reflect: Bone density results: OSTEOPOROSIS. Repeat every 2 years.  Lung Cancer Screening: (Low  Dose CT Chest recommended if Age 64-80 years, 20 pack-year currently smoking OR have quit w/in 15years.) does not qualify.   Lung Cancer Screening Referral: N/A  Additional Screening:  Hepatitis C Screening: does qualify; Completed 09/16/15  Vision Screening: Recommended annual ophthalmology exams for early detection of glaucoma and other disorders of the eye. Is the patient up to date with their annual eye exam?  Yes  Who is the provider or what is the name of the office in which the patient attends annual eye exams? [facility] If pt is not established with a provider, would they like to be referred to a provider to establish care? N/A.   Dental Screening: Recommended annual dental exams for proper oral hygiene  Diabetic Foot Exam: N/A- patient is not diabetic  Community Resource Referral / Chronic Care Management: CRR required this visit?  No   CCM  required this visit?  No     Plan:     I have personally reviewed and noted the following in the patient's chart:   Medical and social history Use of alcohol, tobacco or illicit drugs  Current medications and supplements including opioid prescriptions. Patient is not currently taking opioid prescriptions. Functional ability and status Nutritional status Physical activity Advanced directives List of other physicians Hospitalizations, surgeries, and ER visits in previous 12 months Vitals Screenings to include cognitive, depression, and falls Referrals and appointments  In addition, I have reviewed and discussed with patient certain preventive protocols, quality metrics, and best practice recommendations. A written personalized care plan for preventive services as well as general preventive health recommendations were provided to patient.      Rehanna Oloughlin T Gregery Walberg, NP   06/18/2024   After Visit Summary: (In Person-Printed) AVS printed and given to the patient

## 2024-06-18 NOTE — Assessment & Plan Note (Addendum)
 Chronic, ongoing.  Continue minimal use of Xanax , educated her at length on risk of long term use, however 30 pills has lasted her one year.  Using appropriately.  She denies SI/HI. Obtain UDS and controlled substance contract next visit.  Will trial a low dose of Buspar daily per her request to have more daily control. She will alert PCP if any issues, prefers to continue annual visits.

## 2024-06-18 NOTE — Assessment & Plan Note (Signed)
Has used Xanax since her 46's -- discussed with her today and goal is to reduce and discontinue in future..  UDS and contract next visit.

## 2024-06-18 NOTE — Assessment & Plan Note (Signed)
 Chronic, ongoing.  Continue current medication regimen and adjust as needed. Lipid panel today.

## 2024-06-18 NOTE — Assessment & Plan Note (Signed)
Ongoing, stable.  Continue daily oral supplement and check level today.  Previously received injections, but is finding similar benefit from oral supplement.

## 2024-06-18 NOTE — Assessment & Plan Note (Signed)
Chronic, ongoing.  Continue current medication regimen and adjust as needed.  Obtain TSH and Free T4 today.    

## 2024-06-18 NOTE — Progress Notes (Signed)
Contacted via MyChart   Urine is all negative:)

## 2024-06-18 NOTE — Assessment & Plan Note (Signed)
Chronic, stable.  Well-controlled at this time.  Continue collaboration with neurology as needed and current medication regimen.

## 2024-06-18 NOTE — Assessment & Plan Note (Signed)
Chronic, stable.  Continue collaboration with cardiology and Metoprolol.  Last Holter performed 10/14/20.

## 2024-06-18 NOTE — Assessment & Plan Note (Signed)
Chronic, ongoing.  Noted on imaging 03/17/20.  At this time continue Atorvastatin as ordered. Educated patient.

## 2024-06-18 NOTE — Assessment & Plan Note (Signed)
 Chronic, stable.  BP well below goal.  Goal <130/80.  Recommend she monitor BP at least a few mornings a week at home and document.  DASH diet at home.  Continue current medication regimen and adjust as needed.  Labs today: CMP, TSH, Lipid, CBC, UA.  Return in one year, sooner if any changes present.

## 2024-06-19 NOTE — Progress Notes (Signed)
 Contacted via MyChart  Good evening Savannah [name], your labs have returned: - CBC overall stable with no anemia or infection. - Kidney function, creatinine and eGFR, remains normal, as is liver function, AST and ALT.  Glucose level is pending. - Remainder of labs all look fantastic!!  No medication changes needed.  You are amazing!! Keep being inspiring!!  Thank you for allowing me to participate in your care.  I appreciate you. Kindest regards, Shaletta Hinostroza

## 2024-06-20 ENCOUNTER — Encounter: Payer: Self-pay | Admitting: Nurse Practitioner

## 2024-06-20 LAB — COMPREHENSIVE METABOLIC PANEL WITH GFR
ALT: 12 IU/L (ref 0–32)
AST: 16 IU/L (ref 0–40)
Albumin: 4.4 g/dL (ref 3.8–4.8)
Alkaline Phosphatase: 70 IU/L (ref 44–121)
BUN/Creatinine Ratio: 15 (ref 12–28)
BUN: 10 mg/dL (ref 8–27)
Bilirubin Total: 0.6 mg/dL (ref 0.0–1.2)
CO2: 19 mmol/L — ABNORMAL LOW (ref 20–29)
Calcium: 9.6 mg/dL (ref 8.7–10.3)
Chloride: 103 mmol/L (ref 96–106)
Creatinine, Ser: 0.65 mg/dL (ref 0.57–1.00)
Globulin, Total: 2.2 g/dL (ref 1.5–4.5)
Glucose: 82 mg/dL (ref 70–99)
Potassium: 4.3 mmol/L (ref 3.5–5.2)
Sodium: 143 mmol/L (ref 134–144)
Total Protein: 6.6 g/dL (ref 6.0–8.5)
eGFR: 90 mL/min/{1.73_m2} (ref >59)

## 2024-06-20 LAB — LIPID PANEL W/O CHOL/HDL RATIO
Cholesterol, Total: 198 mg/dL (ref 100–199)
HDL: 79 mg/dL (ref >39)
LDL Chol Calc (NIH): 97 mg/dL (ref 0–99)
Triglycerides: 126 mg/dL (ref 0–149)
VLDL Cholesterol Cal: 22 mg/dL (ref 5–40)

## 2024-06-20 LAB — VITAMIN D 25 HYDROXY (VIT D DEFICIENCY, FRACTURES): Vit D, 25-Hydroxy: 52.2 ng/mL (ref 30.0–100.0)

## 2024-06-20 LAB — CBC WITH DIFFERENTIAL/PLATELET
Basophils Absolute: 0.1 10*3/uL (ref 0.0–0.2)
Basos: 1 %
EOS (ABSOLUTE): 0.1 10*3/uL (ref 0.0–0.4)
Eos: 2 %
Hematocrit: 45.3 % (ref 34.0–46.6)
Hemoglobin: 15 g/dL (ref 11.1–15.9)
Immature Grans (Abs): 0.1 10*3/uL (ref 0.0–0.1)
Immature Granulocytes: 1 %
Lymphocytes Absolute: 1.4 10*3/uL (ref 0.7–3.1)
Lymphs: 23 %
MCH: 32.8 pg (ref 26.6–33.0)
MCHC: 33.1 g/dL (ref 31.5–35.7)
MCV: 99 fL — ABNORMAL HIGH (ref 79–97)
Monocytes Absolute: 0.7 10*3/uL (ref 0.1–0.9)
Monocytes: 11 %
Neutrophils Absolute: 3.6 10*3/uL (ref 1.4–7.0)
Neutrophils: 62 %
Platelets: 201 10*3/uL (ref 150–450)
RBC: 4.58 x10E6/uL (ref 3.77–5.28)
RDW: 11.9 % (ref 11.7–15.4)
WBC: 5.8 10*3/uL (ref 3.4–10.8)

## 2024-06-20 LAB — TSH: TSH: 2.77 u[IU]/mL (ref 0.450–4.500)

## 2024-06-20 LAB — T4, FREE: Free T4: 1.44 ng/dL (ref 0.82–1.77)

## 2024-06-20 LAB — VITAMIN B12: Vitamin B-12: 839 pg/mL (ref 232–1245)

## 2024-07-05 ENCOUNTER — Other Ambulatory Visit: Payer: Self-pay | Admitting: Nurse Practitioner

## 2024-07-05 DIAGNOSIS — Z1231 Encounter for screening mammogram for malignant neoplasm of breast: Secondary | ICD-10-CM

## 2024-07-30 ENCOUNTER — Ambulatory Visit: Payer: Self-pay

## 2024-07-30 ENCOUNTER — Ambulatory Visit: Admitting: Family Medicine

## 2024-07-30 ENCOUNTER — Encounter: Payer: Self-pay | Admitting: Family Medicine

## 2024-07-30 VITALS — BP 149/83 | HR 70 | Temp 97.8°F | Ht 65.4 in | Wt 146.4 lb

## 2024-07-30 DIAGNOSIS — N9089 Other specified noninflammatory disorders of vulva and perineum: Secondary | ICD-10-CM

## 2024-07-30 DIAGNOSIS — N9489 Other specified conditions associated with female genital organs and menstrual cycle: Secondary | ICD-10-CM | POA: Diagnosis not present

## 2024-07-30 LAB — MICROSCOPIC EXAMINATION
Bacteria, UA: NONE SEEN
WBC, UA: NONE SEEN /HPF (ref 0–5)

## 2024-07-30 LAB — WET PREP FOR TRICH, YEAST, CLUE
Clue Cell Exam: POSITIVE — AB
Trichomonas Exam: NEGATIVE
Yeast Exam: NEGATIVE

## 2024-07-30 LAB — URINALYSIS, ROUTINE W REFLEX MICROSCOPIC
Bilirubin, UA: NEGATIVE
Glucose, UA: NEGATIVE
Ketones, UA: NEGATIVE
Leukocytes,UA: NEGATIVE
Nitrite, UA: NEGATIVE
Protein,UA: NEGATIVE
Specific Gravity, UA: 1.01 (ref 1.005–1.030)
Urobilinogen, Ur: 0.2 mg/dL (ref 0.2–1.0)
pH, UA: 5.5 (ref 5.0–7.5)

## 2024-07-30 MED ORDER — CLINDAMYCIN PHOSPHATE 2 % VA GEL: 1.0000 | Freq: Every day | VAGINAL | 0 refills | Status: DC

## 2024-07-30 MED ORDER — TRIAMCINOLONE ACETONIDE 40 MG/ML IJ SUSP
40.0000 mg | Freq: Once | INTRAMUSCULAR | Status: AC
Start: 2024-07-30 — End: 2024-07-30
  Administered 2024-07-30 (×2): 40 mg via INTRAMUSCULAR

## 2024-07-30 MED ORDER — PREMARIN 0.625 MG/GM VA CREA: 1.0000 | TOPICAL_CREAM | Freq: Every day | VAGINAL | 12 refills | Status: DC

## 2024-07-30 MED ORDER — PREDNISONE 50 MG PO TABS: 50.0000 mg | ORAL_TABLET | Freq: Every day | ORAL | 0 refills | Status: DC

## 2024-07-30 NOTE — Telephone Encounter (Signed)
 Patient conference call completed with office as patient requested d/t no availability until Wednesday. Patient refused UC, stating that she needs to see PCP and wants to speak to office to see if she can be squeezed in.  FYI Only or Action Required?: FYI only for provider.  Patient was last seen in primary care on 06/18/2024 by Valerio Melanie DASEN, NP.  Called Nurse Triage reporting No chief complaint on file..  Symptoms began yesterday.  Interventions attempted: Nothing.  Symptoms are: unchanged.  Triage Disposition: No disposition on file.  Patient/caregiver understands and will follow disposition?:    Reason for Disposition  [1] MODERATE (e.g., interferes with normal activities) pelvic pain AND [2] pain comes and goes (cramps) AND [3] present > 24 hours  Answer Assessment - Initial Assessment Questions 1. LOCATION: Where does it hurt?      Vaginal area  2. RADIATION: Does the pain shoot anywhere else? (e.g., lower back, groin, thighs)     To buttocks  3. ONSET: When did the pain begin? (e.g., minutes, hours or days ago)      2 days  4. SUDDEN: Gradual or sudden onset?     Sudden  5. PATTERN Does the pain come and go, or is it constant?     Intermittent  6. SEVERITY: How bad is the pain?  (e.g., Scale 1-10; mild, moderate, or severe)     5 7. RECURRENT SYMPTOM: Have you ever had this type of pelvic pain before? If Yes, ask: When was the last time? and What happened that time?      Denies  Protocols used: Pelvic Pain - [facility] Copied from CRM [id number]. Topic: Clinical - Red Word Triage >> Jul 30, 2024  1:21 PM Willma R wrote: [facility] that prompted transfer to Nurse Triage: Patient has been experiencing vaginal pain for the last two days.

## 2024-07-30 NOTE — Progress Notes (Signed)
 BP (!) 149/83   Pulse 70   Temp 97.8 F (36.6 C) (Oral)   Ht 5' 5.4 (1.661 m)   Wt 146 lb 6.4 oz (66.4 kg)   LMP  (LMP Unknown)   SpO2 98%   BMI 24.07 kg/m    Subjective:    Patient ID: Savannah [name], female    DOB: January 07, 1946, 78 y.o.   MRN: [id number]  HPI: Savannah [name] is a 78 y.o. female  Chief Complaint  Patient presents with   Vaginal Pain    Onset yesterday. Concerns of possible Herpes. 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 Very painful to drive. Does have a burning sensation.  Also concerns of possible uti   VAGINAL DISCOMFORT Duration: yesterday, had a sore on her buttocks about 2-3 weeks ago, has not changed, has been putting moisturizer on it Discharge description: no discharge Quality: burning and stabbing Location: bilateral labia and into her buttock on the L side  Pruritus: no Dysuria: no Malodorous: no Urinary frequency: yes, no changes Fevers: no Abdominal pain: no  Sexual activity: monogamous History of sexually transmitted diseases: no Recent antibiotic use: no Context: very unusual, never anything like this before  Treatments attempted: none  Relevant past medical, surgical, family and social history reviewed and updated as indicated. Interim medical history since our last visit reviewed. Allergies and medications reviewed and updated.  Review of Systems  Constitutional: Negative.   Respiratory: Negative.    Cardiovascular: Negative.   Genitourinary:  Positive for vaginal pain. Negative for decreased urine volume, difficulty urinating, dyspareunia, dysuria, enuresis, flank pain, frequency, genital sores, hematuria, menstrual problem, pelvic pain, urgency, vaginal bleeding and vaginal discharge.  Musculoskeletal: Negative.   Skin: Negative.   Neurological: Negative.   Psychiatric/Behavioral: Negative.      Per HPI unless specifically indicated above     Objective:    BP (!) 149/83   Pulse 70   Temp 97.8 F (36.6 C) (Oral)   Ht 5' 5.4  (1.661 m)   Wt 146 lb 6.4 oz (66.4 kg)   LMP  (LMP Unknown)   SpO2 98%   BMI 24.07 kg/m   Wt Readings from Last 3 Encounters:  07/30/24 146 lb 6.4 oz (66.4 kg)  06/18/24 152 lb (68.9 kg)  06/16/23 147 lb 9.6 oz (67 kg)    Physical Exam Vitals and nursing note reviewed.  Constitutional:      General: She is not in acute distress.    Appearance: Normal appearance. She is not ill-appearing, toxic-appearing or diaphoretic.  HENT:     Head: Normocephalic and atraumatic.     Right Ear: External ear normal.     Left Ear: External ear normal.     Nose: Nose normal.     Mouth/Throat:     Mouth: Mucous membranes are moist.     Pharynx: Oropharynx is clear.  Eyes:     General: No scleral icterus.       Right eye: No discharge.        Left eye: No discharge.     Extraocular Movements: Extraocular movements intact.     Conjunctiva/sclera: Conjunctivae normal.     Pupils: Pupils are equal, round, and reactive to light.  Cardiovascular:     Rate and Rhythm: Normal rate and regular rhythm.     Pulses: Normal pulses.     Heart sounds: Normal heart sounds. No murmur heard.    No friction rub. No gallop.  Pulmonary:  Effort: Pulmonary effort is normal. No respiratory distress.     Breath sounds: Normal breath sounds. No stridor. No wheezing, rhonchi or rales.  Chest:     Chest wall: No tenderness.  Genitourinary:    Comments: Swollen and irritated vulva, very dry, no rash, no vesicles Musculoskeletal:        General: Normal range of motion.     Cervical back: Normal range of motion and neck supple.  Skin:    General: Skin is warm and dry.     Capillary Refill: Capillary refill takes less than 2 seconds.     Coloration: Skin is not jaundiced or pale.     Findings: No bruising, erythema, lesion or rash.  Neurological:     General: No focal deficit present.     Mental Status: She is alert and oriented to person, place, and time. Mental status is at baseline.  Psychiatric:         Mood and Affect: Mood normal.        Behavior: Behavior normal.        Thought Content: Thought content normal.        Judgment: Judgment normal.     Results for orders placed or performed in visit on 06/18/24  Urinalysis, Routine w reflex microscopic   Collection Time: 06/18/24 10:46 AM  Result Value Ref Range   Specific Gravity, UA 1.010 1.005 - 1.030   pH, UA 7.0 5.0 - 7.5   Color, UA Yellow Yellow   Appearance Ur Clear Clear   Leukocytes,UA Negative Negative   Protein,UA Negative Negative/Trace   Glucose, UA Negative Negative   Ketones, UA Negative Negative   RBC, UA Negative Negative   Bilirubin, UA Negative Negative   Urobilinogen, Ur 0.2 0.2 - 1.0 mg/dL   Nitrite, UA Negative Negative   Microscopic Examination Comment   CBC with Differential/Platelet   Collection Time: 06/18/24 10:47 AM  Result Value Ref Range   WBC 5.8 3.4 - 10.8 x10E3/uL   RBC 4.58 3.77 - 5.28 x10E6/uL   Hemoglobin 15.0 11.1 - 15.9 g/dL   Hematocrit 54.6 65.9 - 46.6 %   MCV 99 (H) 79 - 97 fL   MCH 32.8 26.6 - 33.0 pg   MCHC 33.1 31.5 - 35.7 g/dL   RDW 88.0 88.2 - 84.5 %   Platelets 201 150 - 450 x10E3/uL   Neutrophils 62 Not Estab. %   Lymphs 23 Not Estab. %   Monocytes 11 Not Estab. %   Eos 2 Not Estab. %   Basos 1 Not Estab. %   Neutrophils Absolute 3.6 1.4 - 7.0 x10E3/uL   Lymphocytes Absolute 1.4 0.7 - 3.1 x10E3/uL   Monocytes Absolute 0.7 0.1 - 0.9 x10E3/uL   EOS (ABSOLUTE) 0.1 0.0 - 0.4 x10E3/uL   Basophils Absolute 0.1 0.0 - 0.2 x10E3/uL   Immature Granulocytes 1 Not Estab. %   Immature Grans (Abs) 0.1 0.0 - 0.1 x10E3/uL  Comprehensive metabolic panel with GFR   Collection Time: 06/18/24 10:47 AM  Result Value Ref Range   Glucose 82 70 - 99 mg/dL   BUN 10 8 - 27 mg/dL   Creatinine, Ser 9.34 0.57 - 1.00 mg/dL   eGFR 90 >40 fO/fpw/8.26   BUN/Creatinine Ratio 15 12 - 28   Sodium 143 134 - 144 mmol/L   Potassium 4.3 3.5 - 5.2 mmol/L   Chloride 103 96 - 106 mmol/L   CO2 19 (L) 20  - 29 mmol/L   Calcium  9.6 8.7 -  10.3 mg/dL   Total Protein 6.6 6.0 - 8.5 g/dL   Albumin 4.4 3.8 - 4.8 g/dL   Globulin, Total 2.2 1.5 - 4.5 g/dL   Bilirubin Total 0.6 0.0 - 1.2 mg/dL   Alkaline Phosphatase 70 44 - 121 IU/L   AST 16 0 - 40 IU/L   ALT 12 0 - 32 IU/L  Lipid Panel w/o Chol/HDL Ratio   Collection Time: 06/18/24 10:47 AM  Result Value Ref Range   Cholesterol, Total 198 100 - 199 mg/dL   Triglycerides 873 0 - 149 mg/dL   HDL 79 >60 mg/dL   VLDL Cholesterol Cal 22 5 - 40 mg/dL   LDL Chol Calc (NIH) 97 0 - 99 mg/dL  TSH   Collection Time: 06/18/24 10:47 AM  Result Value Ref Range   TSH 2.770 0.450 - 4.500 uIU/mL  VITAMIN D  25 Hydroxy (Vit-D Deficiency, Fractures)   Collection Time: 06/18/24 10:47 AM  Result Value Ref Range   Vit D, 25-Hydroxy 52.2 30.0 - 100.0 ng/mL  Vitamin B12   Collection Time: 06/18/24 10:47 AM  Result Value Ref Range   Vitamin B-12 839 232 - 1,245 pg/mL  T4, free   Collection Time: 06/18/24 10:47 AM  Result Value Ref Range   Free T4 1.44 0.82 - 1.77 ng/dL      Assessment & Plan:   Problem List Items Addressed This Visit   None Visit Diagnoses       Labial irritation    -  Primary   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 Will treat BV- clindamycin . Restart vaginal estrogen. Prednisone  for inflammation. Recheck with PCP in 2 weeks.   Relevant Medications   triamcinolone  acetonide (KENALOG -40) injection 40 mg     Vaginal burning       + clue cells and trace leuks   Relevant Orders   WET PREP FOR TRICH, YEAST, CLUE   Urinalysis, Routine w reflex microscopic        Follow up plan: Return in about 2 weeks (around 08/13/2024) for with PCP.

## 2024-07-31 ENCOUNTER — Telehealth: Payer: Self-pay

## 2024-07-31 NOTE — Telephone Encounter (Signed)
 Copied from CRM [id number]. Topic: Clinical - Prescription Issue >> Jul 31, 2024 12:20 PM Winona R wrote: Pt calling stating the pharmacy need a valid dosage. A full applicator of the creme is not a valid dosage to last 30 days. As a full tube will only last the pt 15 days if she use a full applicator. Applicator is 2 grams please verify with the pharmacy. [facility] - [place], [place] - [address] [place] [address] Phone: [phone]  [facility]- Pharmacist)

## 2024-08-01 ENCOUNTER — Encounter: Payer: Self-pay | Admitting: Family Medicine

## 2024-08-01 NOTE — Telephone Encounter (Signed)
 Routing to provider who saw the patient.

## 2024-08-03 ENCOUNTER — Ambulatory Visit: Admitting: Family Medicine

## 2024-08-03 ENCOUNTER — Ambulatory Visit: Payer: Self-pay

## 2024-08-03 NOTE — Telephone Encounter (Signed)
 FYI Only or Action Required?: Action required by provider: Please see nurse triage notes.  Patient was last seen in primary care on 07/30/2024 by Savannah Duwaine SQUIBB, DO.  Called Nurse Triage reporting Vaginal Pain.  Symptoms began several days ago.  Interventions attempted: Prescription medications: Vaginal cream, prednisone , Tylenol .  Symptoms are: gradually worsening.  Triage Disposition: Call PCP Within 24 Hours  Patient/caregiver understands and will follow disposition?: Yes            Copied from CRM [id number]. Topic: Clinical - Red Word Triage >> Aug 03, 2024  9:41 AM Savannah [name] wrote: [facility] that prompted transfer to Nurse Triage: Patient stated she called to inform of her vaginal discomfort and pain the Rx given is still not working, she asked to speak to someone from the clinic or a nurse as she has tried to reach out via MyChart numerous times but no one is answering her. Since  Monday with no relief.    Called the CAL no answer.  Warm transferring to NT Reason for Disposition  [1] Follow-up call from patient regarding patient's clinical status AND [2] information NON-URGENT  Answer Assessment - Initial Assessment Questions 1. REASON FOR CALL or QUESTION: What is your reason for calling today? or How can I best     Patient was not receptive to being triaged. Patient states she sent in multiple mychart messages describing symptoms and symptom updates since being seen in office on Monday. Patient is asking provider to review MyChart messages and for pain medication and steroid taper pack to be sent to [facility] on [address]  in [place], [place] . Pt states she has required the taper pack in the past.   2. CALLER: Document the source of call. (e.g., laboratory staff, caregiver or patient).     Patient  Protocols used: PCP Call - No Triage-A-AH

## 2024-08-04 NOTE — Patient Instructions (Signed)
 Dyspareunia, Female Dyspareunia is pain that is associated with sexual activity. This can affect any part of the genitals or lower abdomen. There are many possible causes of this condition. In some cases, diagnosing the cause of dyspareunia can be difficult. This condition can be mild, moderate, or severe. Depending on the cause, dyspareunia may get better with treatment, but it may return (recur) over time. What are the causes?  The cause of this condition is not always known. However, problems that affect the outer female genital area (vulva), the vagina, the uterus, and other organs may cause dyspareunia. Common causes of this condition include: Vaginal dryness. Giving birth. Infection. Skin changes or conditions. Side effects of medicines. A condition of severe pain and tenderness of the vulva when it is touched (vulvodynia). Endometriosis. This is when tissue that is like the lining of the uterus grows on the outside of the uterus. Psychological conditions. These include depression, anxiety, or traumatic experiences. Allergic reaction. What increases the risk? The following factors may make you more likely to develop this condition: History of physical or sexual trauma. Some medicines. No longer having a monthly period (menopause). Having recently given birth. Taking baths using soaps that have perfumes. These can cause irritation. Douching. What are the signs or symptoms? The main symptom of this condition is pain in any part of your genitals or lower abdomen during or after sex. This may include: Pain and tenderness of the vulva when it is touched. Irritation, burning, or stinging sensations in your vulva. Aching and throbbing pain that may be constant. Pain that gets worse when something is inserted into your vagina. How is this diagnosed? This condition may be diagnosed based on: Your symptoms, including where and when your pain occurs. Your medical history. A physical  exam. A pelvic exam will most likely be done. Tests, including blood tests and tests that check the body for infection. Imaging tests, such as an ultrasound. You may be referred to a health care provider who specializes in women's health (gynecologist). How is this treated? Treatment for this condition depends on the cause of the condition and your symptoms. Treatment may include: Lubricants, ointments, and creams. Physical therapy. Massage therapy. Hormonal therapy. Medicines to: Prevent or fight infection. Relieve pain. Help numb the area. Treat depression (antidepressants). Counseling, which may include sex therapy. Surgery. In most cases, you may need to stop sexual activity until your symptoms go away or get better. Follow these instructions at home: Lifestyle Wear cotton underwear. Use water-based lubricants as needed during sex. Avoid oil-based lubricants. Do not use any products that can cause irritation. This may include certain condoms, spermicides, lubricants, soaps, tampons, vaginal sprays, or douches. Always practice safe sex. Talk to your health care provider about how to prevent sexually transmitted infections with this condition. Talk freely with your partner about your condition. General instructions Take over-the-counter and prescription medicines only as told by your health care provider. Urinate before you have sex. Consider joining a support group. It is up to you to get the results of any tests you have done. Ask your health care provider, or the department that is doing the tests, when your results will be ready. Keep all follow-up visits. This is important. Contact a health care provider if: You have vaginal bleeding after having sex. You develop a lump at the opening of your vagina even if the lump is painless. You have symptoms that get worse or do not improve with treatment. You have: Abnormal discharge from your  vagina. Vaginal dryness. Itchiness or  irritation of your vulva or vagina. You develop a new rash. You have a fever. You have pain when you urinate or blood in your urine. Summary Dyspareunia is pain that is associated with sexual activity. This can affect any part of the genitals or lower abdomen. There are many causes of this condition. Treatment depends on the cause and your symptoms. In most cases, you may need to stop sexual activity until your symptoms improve. Take over-the-counter and prescription medicines only as told by your health care provider. Contact a health care provider if your symptoms get worse or do not improve with treatment. Keep all follow-up visits. This is important. This information is not intended to replace advice given to you by your health care provider. Make sure you discuss any questions you have with your health care provider. Document Revised: 04/14/2021 Document Reviewed: 04/14/2021 Elsevier Patient Education  2025 [employer].

## 2024-08-05 NOTE — Telephone Encounter (Signed)
 Pea sized application daily please

## 2024-08-06 NOTE — Telephone Encounter (Addendum)
 Patient has appointment with Savannah [name] tomorrow. I would advise her to discuss this at that time.   Estrogen cream instructions have been discussed in previous message and patient is out of town. No further instructions- just keep appointment tomorrow.

## 2024-08-06 NOTE — Telephone Encounter (Signed)
 Left message on provider line.

## 2024-08-06 NOTE — Telephone Encounter (Signed)
 Noted

## 2024-08-06 NOTE — Telephone Encounter (Signed)
 Forwarding to PCP as well as Dr. Vicci who has recently seen patient.

## 2024-08-07 ENCOUNTER — Encounter: Payer: Self-pay | Admitting: Nurse Practitioner

## 2024-08-07 ENCOUNTER — Ambulatory Visit: Admitting: Nurse Practitioner

## 2024-08-07 VITALS — BP 120/74 | HR 67 | Temp 98.5°F | Ht 65.5 in | Wt 148.6 lb

## 2024-08-07 DIAGNOSIS — N952 Postmenopausal atrophic vaginitis: Secondary | ICD-10-CM | POA: Insufficient documentation

## 2024-08-07 DIAGNOSIS — N760 Acute vaginitis: Secondary | ICD-10-CM

## 2024-08-07 DIAGNOSIS — B9689 Other specified bacterial agents as the cause of diseases classified elsewhere: Secondary | ICD-10-CM | POA: Diagnosis not present

## 2024-08-07 LAB — URINALYSIS, ROUTINE W REFLEX MICROSCOPIC
Bilirubin, UA: NEGATIVE
Glucose, UA: NEGATIVE
Ketones, UA: NEGATIVE
Leukocytes,UA: NEGATIVE
Nitrite, UA: NEGATIVE
Protein,UA: NEGATIVE
RBC, UA: NEGATIVE
Specific Gravity, UA: <1.005 — ABNORMAL LOW (ref 1.005–1.030)
Urobilinogen, Ur: 0.2 mg/dL (ref 0.2–1.0)
pH, UA: 6 (ref 5.0–7.5)

## 2024-08-07 LAB — WET PREP FOR TRICH, YEAST, CLUE
Clue Cell Exam: POSITIVE — AB
Trichomonas Exam: NEGATIVE
Yeast Exam: NEGATIVE

## 2024-08-07 MED ORDER — ESTRADIOL 0.1 MG/GM VA CREA: TOPICAL_CREAM | VAGINAL | 12 refills | Status: AC

## 2024-08-07 MED ORDER — METRONIDAZOLE 500 MG PO TABS: 500.0000 mg | ORAL_TABLET | Freq: Two times a day (BID) | ORAL | 0 refills | Status: AC

## 2024-08-07 NOTE — Assessment & Plan Note (Signed)
 Acute and remains present on wet prep today, has completed Clindamycin  gel.  Symptoms are improving, no further pain.  Will send In oral Flagyl  BID for 7 days.  Educated her on this and how to take.  Continue probiotics at home and utilize vaginal estrace  as ordered.

## 2024-08-07 NOTE — Assessment & Plan Note (Signed)
 Suspect this is cause for recent ongoing issue with BV.  Will stop Premarin  cream and switch over to vaginal estrace  cream to apply 3 nights a week.  Educated her on this and preventative benefit it can offer.

## 2024-08-07 NOTE — Progress Notes (Signed)
 BP 120/74 (BP Location: Left Arm)   Pulse 67   Temp 98.5 F (36.9 C) (Oral)   Ht 5' 5.5 (1.664 m)   Wt 148 lb 9.6 oz (67.4 kg)   LMP  (LMP Unknown)   SpO2 98%   BMI 24.35 kg/m    Subjective:    Patient ID: Savannah [name], female    DOB: [birth year], 78 y.o.   MRN: [id number]  HPI: Savannah [name] is a 78 y.o. female  Chief Complaint  Patient presents with   Vaginal Pain    Patient states she was seen last Monday for vaginal pain. States her pain got worse last week and was still a 7 or 8 Friday evening. States she took some tylenol  to help with pain.    VAGINAL PAIN Follow-up today for vaginal pain, tested + for BV and was treated Clindamycin  gel and Prednisone  for pain. Started on hormone cream to use.  Feeling much better today.   Duration: weeks Discharge description: mucous  Pruritus: no Dysuria: no Malodorous: no Urinary frequency: no Fevers: no Abdominal pain: no  Sexual activity: not sexually active Recent antibiotic use: no Context: improving Treatments attempted: none   Relevant past medical, surgical, family and social history reviewed and updated as indicated. Interim medical history since our last visit reviewed. Allergies and medications reviewed and updated.  Review of Systems  Constitutional:  Negative for activity change, appetite change, diaphoresis, fatigue and fever.  Respiratory:  Negative for cough, chest tightness and shortness of breath.   Cardiovascular:  Negative for chest pain, palpitations and leg swelling.  Gastrointestinal: Negative.   Genitourinary:  Negative for decreased urine volume, difficulty urinating, dyspareunia, dysuria, frequency, hematuria, pelvic pain, urgency and vaginal discharge.  Neurological: Negative.   Psychiatric/Behavioral: Negative.      Per HPI unless specifically indicated above     Objective:    BP 120/74 (BP Location: Left Arm)   Pulse 67   Temp 98.5 F (36.9 C) (Oral)   Ht 5' 5.5 (1.664 m)   Wt 148 lb 9.6  oz (67.4 kg)   LMP  (LMP Unknown)   SpO2 98%   BMI 24.35 kg/m   Wt Readings from Last 3 Encounters:  08/07/24 148 lb 9.6 oz (67.4 kg)  07/30/24 146 lb 6.4 oz (66.4 kg)  06/18/24 152 lb (68.9 kg)    Physical Exam Vitals and nursing note reviewed.  Constitutional:      General: She is awake. She is not in acute distress.    Appearance: She is well-developed and well-groomed. She is not ill-appearing or toxic-appearing.  HENT:     Head: Normocephalic.     Right Ear: Hearing and external ear normal.     Left Ear: Hearing and external ear normal.  Eyes:     General: Lids are normal.        Right eye: No discharge.        Left eye: No discharge.     Conjunctiva/sclera: Conjunctivae normal.     Pupils: Pupils are equal, round, and reactive to light.  Neck:     Thyroid : No thyromegaly.     Vascular: No carotid bruit.  Cardiovascular:     Rate and Rhythm: Normal rate and regular rhythm.     Heart sounds: Normal heart sounds. No murmur heard.    No gallop.  Pulmonary:     Effort: Pulmonary effort is normal. No accessory muscle usage or respiratory distress.     Breath sounds:  Normal breath sounds.  Abdominal:     General: Bowel sounds are normal. There is no distension.     Palpations: Abdomen is soft.     Tenderness: There is no abdominal tenderness.  Musculoskeletal:     Cervical back: Normal range of motion and neck supple.     Right lower leg: No edema.     Left lower leg: No edema.  Lymphadenopathy:     Cervical: No cervical adenopathy.  Skin:    General: Skin is warm and dry.  Neurological:     Mental Status: She is alert and oriented to person, place, and time.     Deep Tendon Reflexes: Reflexes are normal and symmetric.     Reflex Scores:      Brachioradialis reflexes are 2+ on the right side and 2+ on the left side.      Patellar reflexes are 2+ on the right side and 2+ on the left side. Psychiatric:        Attention and Perception: Attention normal.        Mood  and Affect: Mood normal.        Speech: Speech normal.        Behavior: Behavior normal. Behavior is cooperative.        Thought Content: Thought content normal.     Results for orders placed or performed in visit on 07/30/24  WET PREP FOR TRICH, YEAST, CLUE   Collection Time: 07/30/24  2:45 PM   Specimen: Urine   Urine  Result Value Ref Range   Trichomonas Exam Negative Negative   Yeast Exam Negative Negative   Clue Cell Exam Positive (A) Negative  Microscopic Examination   Collection Time: 07/30/24  2:45 PM   Urine  Result Value Ref Range   WBC, UA None seen 0 - 5 /hpf   RBC, Urine 0-2 0 - 2 /hpf   Epithelial Cells (non renal) 0-10 0 - 10 /hpf   Bacteria, UA None seen None seen/Few  Urinalysis, Routine w reflex microscopic   Collection Time: 07/30/24  2:45 PM  Result Value Ref Range   Specific Gravity, UA 1.010 1.005 - 1.030   pH, UA 5.5 5.0 - 7.5   Color, UA Yellow Yellow   Appearance Ur Clear Clear   Leukocytes,UA Negative Negative   Protein,UA Negative Negative/Trace   Glucose, UA Negative Negative   Ketones, UA Negative Negative   RBC, UA Trace (A) Negative   Bilirubin, UA Negative Negative   Urobilinogen, Ur 0.2 0.2 - 1.0 mg/dL   Nitrite, UA Negative Negative   Microscopic Examination See below:       Assessment & Plan:   Problem List Items Addressed This Visit       Genitourinary   Bacterial vaginosis - Primary   Acute and remains present on wet prep today, has completed Clindamycin  gel.  Symptoms are improving, no further pain.  Will send In oral Flagyl  BID for 7 days.  Educated her on this and how to take.  Continue probiotics at home and utilize vaginal estrace  as ordered.      Relevant Medications   metroNIDAZOLE  (FLAGYL ) 500 MG tablet   Other Relevant Orders   Urinalysis, Routine w reflex microscopic   WET PREP FOR TRICH, YEAST, CLUE   Atrophic vaginitis   Suspect this is cause for recent ongoing issue with BV.  Will stop Premarin  cream and  switch over to vaginal estrace  cream to apply 3 nights a week.  Educated her  on this and preventative benefit it can offer.          Follow up plan: Return if symptoms worsen or fail to improve.

## 2024-08-08 ENCOUNTER — Encounter: Payer: Self-pay | Admitting: Nurse Practitioner

## 2024-08-14 ENCOUNTER — Other Ambulatory Visit

## 2024-08-14 ENCOUNTER — Encounter

## 2024-08-26 ENCOUNTER — Encounter: Payer: Self-pay | Admitting: Nurse Practitioner

## 2024-08-27 ENCOUNTER — Encounter: Payer: Self-pay | Admitting: Family Medicine

## 2024-08-27 ENCOUNTER — Ambulatory Visit: Admitting: Family Medicine

## 2024-08-27 VITALS — BP 145/80 | HR 89 | Temp 97.5°F | Ht 65.5 in | Wt 146.8 lb

## 2024-08-27 DIAGNOSIS — N9089 Other specified noninflammatory disorders of vulva and perineum: Secondary | ICD-10-CM | POA: Diagnosis not present

## 2024-08-27 DIAGNOSIS — Z85828 Personal history of other malignant neoplasm of skin: Secondary | ICD-10-CM | POA: Diagnosis not present

## 2024-08-27 DIAGNOSIS — B3731 Acute candidiasis of vulva and vagina: Secondary | ICD-10-CM | POA: Diagnosis not present

## 2024-08-27 DIAGNOSIS — R8281 Pyuria: Secondary | ICD-10-CM | POA: Diagnosis not present

## 2024-08-27 DIAGNOSIS — D2261 Melanocytic nevi of right upper limb, including shoulder: Secondary | ICD-10-CM | POA: Diagnosis not present

## 2024-08-27 DIAGNOSIS — D2262 Melanocytic nevi of left upper limb, including shoulder: Secondary | ICD-10-CM | POA: Diagnosis not present

## 2024-08-27 DIAGNOSIS — D2272 Melanocytic nevi of left lower limb, including hip: Secondary | ICD-10-CM | POA: Diagnosis not present

## 2024-08-27 DIAGNOSIS — B359 Dermatophytosis, unspecified: Secondary | ICD-10-CM | POA: Diagnosis not present

## 2024-08-27 DIAGNOSIS — D2271 Melanocytic nevi of right lower limb, including hip: Secondary | ICD-10-CM | POA: Diagnosis not present

## 2024-08-27 DIAGNOSIS — L570 Actinic keratosis: Secondary | ICD-10-CM | POA: Diagnosis not present

## 2024-08-27 DIAGNOSIS — L309 Dermatitis, unspecified: Secondary | ICD-10-CM | POA: Diagnosis not present

## 2024-08-27 LAB — URINALYSIS, ROUTINE W REFLEX MICROSCOPIC
Bilirubin, UA: NEGATIVE
Glucose, UA: NEGATIVE
Ketones, UA: NEGATIVE
Nitrite, UA: NEGATIVE
Protein,UA: NEGATIVE
Specific Gravity, UA: <1.005 — ABNORMAL LOW (ref 1.005–1.030)
Urobilinogen, Ur: 0.2 mg/dL (ref 0.2–1.0)
pH, UA: 6 (ref 5.0–7.5)

## 2024-08-27 LAB — MICROSCOPIC EXAMINATION: Bacteria, UA: NONE SEEN

## 2024-08-27 LAB — WET PREP FOR TRICH, YEAST, CLUE
Clue Cell Exam: NEGATIVE
Trichomonas Exam: NEGATIVE
Yeast Exam: POSITIVE — AB

## 2024-08-27 MED ORDER — FLUCONAZOLE 150 MG PO TABS: 150.0000 mg | ORAL_TABLET | Freq: Every day | ORAL | 0 refills | Status: AC

## 2024-08-27 NOTE — Progress Notes (Signed)
 BP (!) 145/80 (BP Location: Left Arm, Patient Position: Sitting, Cuff Size: Normal)   Pulse 89   Temp (!) 97.5 F (36.4 C) (Oral)   Ht 5' 5.5 (1.664 m)   Wt 146 lb 12.8 oz (66.6 kg)   LMP  (LMP Unknown)   SpO2 99%   BMI 24.06 kg/m    Subjective:    Patient ID: Savannah [name], female    DOB: 05-09-1946, 78 y.o.   MRN: [id number]  HPI: Savannah [name] is a 78 y.o. female  Chief Complaint  Patient presents with   Follow-up    Bacterial infection, make sure she's in the clear    VAGINAL IRRITATION- Wednesday started with a white rash in her vaginal area. She notes that she had covid 2 weeks ago. She notes that she saw dermatology today and they did a biopsy Duration: about a month Pruritus: yes Dysuria: yes Malodorous: no Urinary frequency: no Fevers: no Abdominal pain: no  Sexual activity: no concerns about STIs History of sexually transmitted diseases: no Recent antibiotic use: yes Context: No better  Treatments attempted: antibiotics, topical and oral steroids  Relevant past medical, surgical, family and social history reviewed and updated as indicated. Interim medical history since our last visit reviewed. Allergies and medications reviewed and updated.  Review of Systems  Constitutional: Negative.   Respiratory: Negative.    Cardiovascular: Negative.   Genitourinary:  Positive for genital sores and vaginal pain. Negative for decreased urine volume, difficulty urinating, dyspareunia, dysuria, enuresis, flank pain, frequency, hematuria, menstrual problem, pelvic pain, urgency, vaginal bleeding and vaginal discharge.    Per HPI unless specifically indicated above     Objective:    BP (!) 145/80 (BP Location: Left Arm, Patient Position: Sitting, Cuff Size: Normal)   Pulse 89   Temp (!) 97.5 F (36.4 C) (Oral)   Ht 5' 5.5 (1.664 m)   Wt 146 lb 12.8 oz (66.6 kg)   LMP  (LMP Unknown)   SpO2 99%   BMI 24.06 kg/m   Wt Readings from Last 3 Encounters:  08/27/24 146 lb  12.8 oz (66.6 kg)  08/07/24 148 lb 9.6 oz (67.4 kg)  07/30/24 146 lb 6.4 oz (66.4 kg)    Physical Exam Vitals and nursing note reviewed.  Constitutional:      General: She is not in acute distress.    Appearance: Normal appearance. She is normal weight. She is not ill-appearing, toxic-appearing or diaphoretic.  HENT:     Head: Normocephalic and atraumatic.     Right Ear: External ear normal.     Left Ear: External ear normal.     Nose: Nose normal.     Mouth/Throat:     Mouth: Mucous membranes are moist.     Pharynx: Oropharynx is clear.  Eyes:     General: No scleral icterus.       Right eye: No discharge.        Left eye: No discharge.     Extraocular Movements: Extraocular movements intact.     Conjunctiva/sclera: Conjunctivae normal.     Pupils: Pupils are equal, round, and reactive to light.  Cardiovascular:     Rate and Rhythm: Normal rate and regular rhythm.     Pulses: Normal pulses.     Heart sounds: Normal heart sounds. No murmur heard.    No friction rub. No gallop.  Pulmonary:     Effort: Pulmonary effort is normal. No respiratory distress.     Breath sounds:  Normal breath sounds. No stridor. No wheezing, rhonchi or rales.  Chest:     Chest wall: No tenderness.  Musculoskeletal:        General: Normal range of motion.     Cervical back: Normal range of motion and neck supple.  Skin:    General: Skin is warm and dry.     Capillary Refill: Capillary refill takes less than 2 seconds.     Coloration: Skin is not jaundiced or pale.     Findings: No bruising, erythema, lesion or rash.  Neurological:     General: No focal deficit present.     Mental Status: She is alert and oriented to person, place, and time. Mental status is at baseline.  Psychiatric:        Mood and Affect: Mood is anxious.        Behavior: Behavior normal.        Thought Content: Thought content normal.        Judgment: Judgment normal.     Results for orders placed or performed in visit  on 08/07/24  Urinalysis, Routine w reflex microscopic   Collection Time: 08/07/24 11:31 AM  Result Value Ref Range   Specific Gravity, UA <1.005 (L) 1.005 - 1.030   pH, UA 6.0 5.0 - 7.5   Color, UA Yellow Yellow   Appearance Ur Clear Clear   Leukocytes,UA Negative Negative   Protein,UA Negative Negative/Trace   Glucose, UA Negative Negative   Ketones, UA Negative Negative   RBC, UA Negative Negative   Bilirubin, UA Negative Negative   Urobilinogen, Ur 0.2 0.2 - 1.0 mg/dL   Nitrite, UA Negative Negative   Microscopic Examination Comment   WET PREP FOR TRICH, YEAST, CLUE   Collection Time: 08/07/24 11:32 AM   Specimen: Vaginal Swab   Vaginal Swab  Result Value Ref Range   Trichomonas Exam Negative Negative   Yeast Exam Negative Negative   Clue Cell Exam Positive (A) Negative      Assessment & Plan:   Problem List Items Addressed This Visit   None Visit Diagnoses       Labial irritation    -  Primary   Significant concern for lichen sclerosis. Await biopsy results. Encouraged her to continue her cloetasol. Follow up with PCP in 2-3 weeks.   Relevant Orders   WET PREP FOR TRICH, YEAST, CLUE   Urinalysis, Routine w reflex microscopic     Yeast vaginitis       Will treat with diflucan . Call with any concerns.   Relevant Medications   fluconazole  (DIFLUCAN ) 150 MG tablet     Pyuria       Will await culture. Treat as needed.   Relevant Orders   Urine Culture        Follow up plan: Return 2-3 weeks with PCP.

## 2024-08-29 LAB — URINE CULTURE

## 2024-08-31 ENCOUNTER — Ambulatory Visit: Payer: Self-pay | Admitting: Family Medicine

## 2024-09-05 DIAGNOSIS — N9089 Other specified noninflammatory disorders of vulva and perineum: Secondary | ICD-10-CM | POA: Diagnosis not present

## 2024-09-05 DIAGNOSIS — N898 Other specified noninflammatory disorders of vagina: Secondary | ICD-10-CM | POA: Diagnosis not present

## 2024-09-07 ENCOUNTER — Encounter: Payer: Self-pay | Admitting: Nurse Practitioner

## 2024-09-07 MED ORDER — SCOPOLAMINE 1 MG/3DAYS TD PT72: 1.0000 | MEDICATED_PATCH | TRANSDERMAL | 0 refills | Status: AC

## 2024-09-10 ENCOUNTER — Ambulatory Visit
Admission: RE | Admit: 2024-09-10 | Discharge: 2024-09-10 | Disposition: A | Discharge status: Home / Self Care | Source: Ambulatory Visit | Attending: Nurse Practitioner | Admitting: Nurse Practitioner

## 2024-09-10 DIAGNOSIS — M810 Age-related osteoporosis without current pathological fracture: Secondary | ICD-10-CM | POA: Insufficient documentation

## 2024-09-10 DIAGNOSIS — Z1231 Encounter for screening mammogram for malignant neoplasm of breast: Secondary | ICD-10-CM | POA: Diagnosis not present

## 2024-09-10 DIAGNOSIS — Z780 Asymptomatic menopausal state: Secondary | ICD-10-CM | POA: Diagnosis not present

## 2024-09-10 NOTE — Progress Notes (Signed)
 Contacted via MyChart  DEXA continues to show osteoporosis with minimal change from past checks.  Continue Boniva  at this time.  Any questions?

## 2024-09-11 ENCOUNTER — Ambulatory Visit: Payer: Self-pay | Admitting: Nurse Practitioner

## 2024-09-11 ENCOUNTER — Ambulatory Visit: Admitting: Nurse Practitioner

## 2024-09-11 NOTE — Progress Notes (Signed)
 Contacted via MyChart   Normal mammogram, may repeat in one year:)

## 2024-09-12 DIAGNOSIS — I10 Essential (primary) hypertension: Secondary | ICD-10-CM | POA: Diagnosis not present

## 2024-09-12 DIAGNOSIS — I493 Ventricular premature depolarization: Secondary | ICD-10-CM | POA: Diagnosis not present

## 2024-09-12 DIAGNOSIS — E782 Mixed hyperlipidemia: Secondary | ICD-10-CM | POA: Diagnosis not present

## 2024-10-25 DIAGNOSIS — H43812 Vitreous degeneration, left eye: Secondary | ICD-10-CM | POA: Diagnosis not present

## 2024-10-25 DIAGNOSIS — H2513 Age-related nuclear cataract, bilateral: Secondary | ICD-10-CM | POA: Diagnosis not present

## 2024-10-25 DIAGNOSIS — Z8669 Personal history of other diseases of the nervous system and sense organs: Secondary | ICD-10-CM | POA: Diagnosis not present

## 2024-10-25 DIAGNOSIS — H43391 Other vitreous opacities, right eye: Secondary | ICD-10-CM | POA: Diagnosis not present

## 2024-12-12 ENCOUNTER — Telehealth: Payer: Self-pay

## 2024-12-12 NOTE — Progress Notes (Signed)
" ° °  12/12/2024  Patient ID: Savannah [name], female   DOB: Dec 25, 1945, 78 y.o.   MRN: [id number]  This patient is appearing on a report for being at risk of failing the adherence measure for cholesterol (statin) medications this calendar year.   Medication: atorvastatin  20mg  Last fill date: 11/28/24 for 90 day supply  Insurance report was not up to date. No action needed at this time.   Channing DELENA Mealing, PharmD, DPLA   "

## 2025-06-20 ENCOUNTER — Encounter: Admitting: Nurse Practitioner
